# Patient Record
Sex: Male | Born: 1960
Health system: Southern US, Community
[De-identification: ages and names within clinical notes are randomized; demographics above are authoritative.]

## PROBLEM LIST (undated history)

## (undated) ENCOUNTER — Ambulatory Visit (HOSPITAL_BASED_OUTPATIENT_CLINIC_OR_DEPARTMENT_OTHER)

## (undated) DIAGNOSIS — R51 Headache: Secondary | ICD-10-CM

## (undated) DIAGNOSIS — M1990 Unspecified osteoarthritis, unspecified site: Secondary | ICD-10-CM

## (undated) DIAGNOSIS — R519 Headache, unspecified: Secondary | ICD-10-CM

## (undated) DIAGNOSIS — K219 Gastro-esophageal reflux disease without esophagitis: Secondary | ICD-10-CM

## (undated) DIAGNOSIS — S0990XA Unspecified injury of head, initial encounter: Secondary | ICD-10-CM

## (undated) DIAGNOSIS — L853 Xerosis cutis: Secondary | ICD-10-CM

## (undated) DIAGNOSIS — G5603 Carpal tunnel syndrome, bilateral upper limbs: Secondary | ICD-10-CM

## (undated) DIAGNOSIS — B1920 Unspecified viral hepatitis C without hepatic coma: Secondary | ICD-10-CM

## (undated) DIAGNOSIS — J439 Emphysema, unspecified: Secondary | ICD-10-CM

## (undated) DIAGNOSIS — F32A Depression, unspecified: Secondary | ICD-10-CM

## (undated) DIAGNOSIS — M549 Dorsalgia, unspecified: Secondary | ICD-10-CM

## (undated) DIAGNOSIS — I499 Cardiac arrhythmia, unspecified: Secondary | ICD-10-CM

## (undated) DIAGNOSIS — F419 Anxiety disorder, unspecified: Secondary | ICD-10-CM

## (undated) DIAGNOSIS — I700 Atherosclerosis of aorta: Secondary | ICD-10-CM

## (undated) DIAGNOSIS — G8929 Other chronic pain: Secondary | ICD-10-CM

## (undated) DIAGNOSIS — J449 Chronic obstructive pulmonary disease, unspecified: Secondary | ICD-10-CM

## (undated) DIAGNOSIS — K529 Noninfective gastroenteritis and colitis, unspecified: Secondary | ICD-10-CM

## (undated) DIAGNOSIS — F1020 Alcohol dependence, uncomplicated: Secondary | ICD-10-CM

## (undated) DIAGNOSIS — F329 Major depressive disorder, single episode, unspecified: Secondary | ICD-10-CM

## (undated) HISTORY — DX: Chronic obstructive pulmonary disease, unspecified: J44.9

## (undated) HISTORY — PX: HERNIA REPAIR: SHX51

## (undated) HISTORY — PX: UMBILICAL HERNIA REPAIR: SHX196

## (undated) HISTORY — DX: Atherosclerosis of aorta: I70.0

## (undated) HISTORY — PX: ESOPHAGOGASTRODUODENOSCOPY: SHX1529

## (undated) HISTORY — PX: WISDOM TOOTH EXTRACTION: SHX21

## (undated) HISTORY — PX: INGUINAL HERNIA REPAIR: SUR1180

## (undated) HISTORY — DX: Emphysema, unspecified: J43.9

## (undated) HISTORY — PX: BACK SURGERY: SHX140

---
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
## 2012-09-14 ENCOUNTER — Other Ambulatory Visit: Payer: Self-pay | Admitting: Neurosurgery

## 2012-09-14 DIAGNOSIS — M48061 Spinal stenosis, lumbar region without neurogenic claudication: Secondary | ICD-10-CM

## 2012-09-22 ENCOUNTER — Ambulatory Visit
Admission: RE | Admit: 2012-09-22 | Discharge: 2012-09-22 | Disposition: A | Discharge status: Home / Self Care | Payer: BC Managed Care – PPO | Source: Ambulatory Visit | Attending: Neurosurgery | Admitting: Neurosurgery

## 2012-09-22 VITALS — BP 106/67 | HR 75

## 2012-09-22 DIAGNOSIS — M48061 Spinal stenosis, lumbar region without neurogenic claudication: Secondary | ICD-10-CM

## 2012-09-22 MED ORDER — IOHEXOL 180 MG/ML  SOLN
15.0000 mL | Freq: Once | INTRAMUSCULAR | Status: AC | PRN
  Administered 2012-09-22: 15 mL via INTRATHECAL

## 2012-09-22 MED ORDER — DIAZEPAM 5 MG PO TABS
10.0000 mg | ORAL_TABLET | Freq: Once | ORAL | Status: AC
  Administered 2012-09-22: 10 mg via ORAL

## 2012-09-22 MED ORDER — ONDANSETRON HCL 4 MG/2ML IJ SOLN: 4.0000 mg | Freq: Once | INTRAMUSCULAR | Status: DC

## 2012-09-22 MED ORDER — MEPERIDINE HCL 100 MG/ML IJ SOLN: 100.0000 mg | Freq: Once | INTRAMUSCULAR | Status: DC

## 2012-09-22 NOTE — Progress Notes (Signed)
Pt states he has been off citalopram for the past 2 days.  Discharge instructions explained.

## 2012-10-31 LAB — HM COLONOSCOPY

## 2012-11-09 ENCOUNTER — Other Ambulatory Visit: Payer: Self-pay | Admitting: Neurosurgery

## 2012-11-14 ENCOUNTER — Encounter (HOSPITAL_COMMUNITY): Payer: Self-pay | Admitting: Pharmacy Technician

## 2012-11-21 NOTE — Pre-Procedure Instructions (Signed)
Clarence Davis  11/21/2012   Your procedure is scheduled on:  Tuesday, August 26th.  Report to [facility] at 5:30AM.  Call this number if you have problems the morning of surgery: [phone]   Remember:   Do not eat food or drink liquids after midnight.   Take these medicines the morning of surgery with A SIP OF WATER: Carvedilol (Coreg),  Omeprazole (Prilosec), Citalopram (Celexa).   Do not wear jewelry, make-up or nail polish.  Do not wear lotions, powders, or perfumes. You may wear deodorant.   Men may shave face and neck.  Do not bring valuables to the hospital.  [facility] is not responsible  for any belongings or valuables.  Contacts, dentures or bridgework may not be worn into surgery.  Leave suitcase in the car. After surgery it may be brought to your room.  For patients admitted to the hospital, checkout time is 11:00 AM the day of discharge.     Special Instructions: Shower using CHG 2 nights before surgery and the night before surgery.  If you shower the day of surgery use CHG.  Use special wash - you have one bottle of CHG for all showers.  You should use approximately 1/3 of the bottle for each shower.   Please read over the following fact sheets that you were given: Pain Booklet, Coughing and Deep Breathing, Blood Transfusion Information and Surgical Site Infection Prevention

## 2012-11-22 ENCOUNTER — Encounter (HOSPITAL_COMMUNITY)
Admission: RE | Admit: 2012-11-22 | Discharge: 2012-11-22 | Disposition: A | Discharge status: Home / Self Care | Payer: BC Managed Care – PPO | Source: Ambulatory Visit | Attending: Neurosurgery | Admitting: Neurosurgery

## 2012-12-16 ENCOUNTER — Encounter (HOSPITAL_COMMUNITY)
Admission: RE | Admit: 2012-12-16 | Discharge: 2012-12-16 | Disposition: A | Discharge status: Home / Self Care | Payer: BC Managed Care – PPO | Source: Ambulatory Visit | Attending: Neurosurgery | Admitting: Neurosurgery

## 2012-12-16 ENCOUNTER — Encounter (HOSPITAL_COMMUNITY): Payer: Self-pay

## 2012-12-16 DIAGNOSIS — Z01812 Encounter for preprocedural laboratory examination: Secondary | ICD-10-CM | POA: Insufficient documentation

## 2012-12-16 DIAGNOSIS — Z01818 Encounter for other preprocedural examination: Secondary | ICD-10-CM | POA: Insufficient documentation

## 2012-12-16 HISTORY — DX: Gastro-esophageal reflux disease without esophagitis: K21.9

## 2012-12-16 HISTORY — DX: Xerosis cutis: L85.3

## 2012-12-16 HISTORY — DX: Other chronic pain: G89.29

## 2012-12-16 HISTORY — DX: Alcohol dependence, uncomplicated: F10.20

## 2012-12-16 HISTORY — DX: Unspecified injury of head, initial encounter: S09.90XA

## 2012-12-16 HISTORY — DX: Unspecified osteoarthritis, unspecified site: M19.90

## 2012-12-16 HISTORY — DX: Depression, unspecified: F32.A

## 2012-12-16 HISTORY — DX: Cardiac arrhythmia, unspecified: I49.9

## 2012-12-16 HISTORY — DX: Major depressive disorder, single episode, unspecified: F32.9

## 2012-12-16 HISTORY — DX: Dorsalgia, unspecified: M54.9

## 2012-12-16 LAB — CBC
HCT: 43.3 % (ref 39.0–52.0)
Hemoglobin: 14.9 g/dL (ref 13.0–17.0)
MCH: 33.6 pg (ref 26.0–34.0)
MCV: 97.7 fL (ref 78.0–100.0)
Platelets: 134 10*3/uL — ABNORMAL LOW (ref 150–400)
RBC: 4.43 MIL/uL (ref 4.22–5.81)
WBC: 5.1 10*3/uL (ref 4.0–10.5)

## 2012-12-16 LAB — COMPREHENSIVE METABOLIC PANEL
ALT: 39 U/L (ref 0–53)
AST: 39 U/L — ABNORMAL HIGH (ref 0–37)
Alkaline Phosphatase: 66 U/L (ref 39–117)
Calcium: 9.4 mg/dL (ref 8.4–10.5)
Potassium: 3.7 mEq/L (ref 3.5–5.1)
Sodium: 137 mEq/L (ref 135–145)
Total Protein: 7.2 g/dL (ref 6.0–8.3)

## 2012-12-16 LAB — SURGICAL PCR SCREEN
MRSA, PCR: NEGATIVE
Staphylococcus aureus: NEGATIVE

## 2012-12-16 LAB — ABO/RH: ABO/RH(D): A POS

## 2012-12-16 LAB — TYPE AND SCREEN

## 2012-12-16 NOTE — Progress Notes (Signed)
12/16/12 1007  OBSTRUCTIVE SLEEP APNEA  Have you ever been diagnosed with sleep apnea through a sleep study? No  Do you snore loudly (loud enough to be heard through closed doors)?  1  Do you often feel tired, fatigued, or sleepy during the daytime? 0  Has anyone observed you stop breathing during your sleep? 0  Do you have, or are you being treated for high blood pressure? 1  BMI more than 35 kg/m2? 0  Age over 51 years old? 1  Neck circumference greater than 40 cm/18 inches? (15 1/2)  Gender: 1  Obstructive Sleep Apnea Score 4  Score 4 or greater  Results sent to PCP

## 2012-12-16 NOTE — Progress Notes (Addendum)
Saw a cardiologist about a yr ago-Dr.Ferns in [place]-requested office visit  Echo and stress test to be requested from Dr.Ferns  EKG and CXR to be requested from Dr.Ferns---[phone](phone) and [phone](fax)  Medical MD in [place] is Dr.Perry  Denies ever having a heart cath

## 2012-12-16 NOTE — Pre-Procedure Instructions (Addendum)
Arslan Kier  12/16/2012   Your procedure is scheduled on:  Fri, Sept 19 @ 11:30 AM  Report to [facility] at 8:30 AM.  Call this number if you have problems the morning of surgery: [phone]   Remember:   Do not eat food or drink liquids after midnight.   Take these medicines the morning of surgery with A SIP OF WATER: Carvedilol(Coreg),Celexa(Citalopram),and Prilosec(Omeprazole) and Pain Pill(if needed)               No Goody's,BC's,Aleve,Ibuprofen,Aspirin,Fish Oil,or any Herbal Medications   Do not wear jewelry.  Do not wear lotions, powders, or colognes. You may wear deodorant.  Men may shave face and neck.  Do not bring valuables to the hospital.  [facility] is not responsible                   for any belongings or valuables.  Contacts, dentures or bridgework may not be worn into surgery.  Leave suitcase in the car. After surgery it may be brought to your room.  For patients admitted to the hospital, checkout time is 11:00 AM the day of  discharge.   Special Instructions: Shower using CHG 2 nights before surgery and the night before surgery.  If you shower the day of surgery use CHG.  Use special wash - you have one bottle of CHG for all showers.  You should use approximately 1/3 of the bottle for each shower.   Please read over the following fact sheets that you were given: Pain Booklet, Coughing and Deep Breathing, Blood Transfusion Information, MRSA Information and Surgical Site Infection Prevention

## 2012-12-16 NOTE — Progress Notes (Signed)
12/16/12 1228  OBSTRUCTIVE SLEEP APNEA  Have you ever been diagnosed with sleep apnea through a sleep study? No  Do you snore loudly (loud enough to be heard through closed doors)?  1  Do you often feel tired, fatigued, or sleepy during the daytime? 0  Has anyone observed you stop breathing during your sleep? 0  Do you have, or are you being treated for high blood pressure? 1  BMI more than 35 kg/m2? 0  Age over 51 years old? 1  Neck circumference greater than 40 cm/18 inches? 0  Gender: 1  Obstructive Sleep Apnea Score 4  Score 4 or greater  Results sent to PCP

## 2012-12-22 MED ORDER — CEFAZOLIN SODIUM-DEXTROSE 2-3 GM-% IV SOLR
2.0000 g | INTRAVENOUS | Status: AC
  Administered 2012-12-23: 2 g via INTRAVENOUS

## 2012-12-23 ENCOUNTER — Encounter (HOSPITAL_COMMUNITY)
Admission: RE | Disposition: A | Discharge status: Home / Self Care | Payer: BC Managed Care – PPO | Source: Ambulatory Visit | Attending: Neurosurgery

## 2012-12-23 ENCOUNTER — Inpatient Hospital Stay (HOSPITAL_COMMUNITY): Payer: BC Managed Care – PPO

## 2012-12-23 ENCOUNTER — Inpatient Hospital Stay (HOSPITAL_COMMUNITY)
Admission: RE | Admit: 2012-12-23 | Discharge: 2012-12-25 | DRG: 755 | Disposition: A | Discharge status: Home / Self Care | Payer: BC Managed Care – PPO | Source: Ambulatory Visit | Attending: Neurosurgery | Admitting: Neurosurgery

## 2012-12-23 ENCOUNTER — Encounter (HOSPITAL_COMMUNITY): Payer: Self-pay | Admitting: Certified Registered Nurse Anesthetist

## 2012-12-23 ENCOUNTER — Inpatient Hospital Stay (HOSPITAL_COMMUNITY): Payer: BC Managed Care – PPO | Admitting: Certified Registered Nurse Anesthetist

## 2012-12-23 DIAGNOSIS — F3289 Other specified depressive episodes: Secondary | ICD-10-CM | POA: Diagnosis present

## 2012-12-23 DIAGNOSIS — Q762 Congenital spondylolisthesis: Secondary | ICD-10-CM

## 2012-12-23 DIAGNOSIS — I4949 Other premature depolarization: Secondary | ICD-10-CM | POA: Diagnosis present

## 2012-12-23 DIAGNOSIS — K219 Gastro-esophageal reflux disease without esophagitis: Secondary | ICD-10-CM | POA: Diagnosis present

## 2012-12-23 DIAGNOSIS — Z981 Arthrodesis status: Secondary | ICD-10-CM

## 2012-12-23 DIAGNOSIS — B1920 Unspecified viral hepatitis C without hepatic coma: Secondary | ICD-10-CM | POA: Diagnosis present

## 2012-12-23 DIAGNOSIS — G8929 Other chronic pain: Secondary | ICD-10-CM | POA: Diagnosis present

## 2012-12-23 DIAGNOSIS — F17200 Nicotine dependence, unspecified, uncomplicated: Secondary | ICD-10-CM | POA: Diagnosis present

## 2012-12-23 DIAGNOSIS — F329 Major depressive disorder, single episode, unspecified: Secondary | ICD-10-CM | POA: Diagnosis present

## 2012-12-23 DIAGNOSIS — M5126 Other intervertebral disc displacement, lumbar region: Principal | ICD-10-CM | POA: Diagnosis present

## 2012-12-23 SURGERY — POSTERIOR LUMBAR FUSION 1 LEVEL
Anesthesia: General | Site: Back | Wound class: Clean

## 2012-12-23 MED ORDER — DIPHENHYDRAMINE HCL 12.5 MG/5ML PO ELIX
12.5000 mg | ORAL_SOLUTION | Freq: Four times a day (QID) | ORAL | Status: DC | PRN
  Administered 2012-12-24: 12.5 mg via ORAL
  Filled 2012-12-23: qty 10

## 2012-12-23 MED ORDER — NICOTINE 21 MG/24HR TD PT24
21.0000 mg | MEDICATED_PATCH | Freq: Every day | TRANSDERMAL | Status: DC
  Administered 2012-12-23 – 2012-12-25 (×3): 21 mg via TRANSDERMAL
  Filled 2012-12-23 (×3): qty 1

## 2012-12-23 MED ORDER — SODIUM CHLORIDE 0.9 % IJ SOLN
3.0000 mL | Freq: Two times a day (BID) | INTRAMUSCULAR | Status: DC
  Administered 2012-12-25: 3 mL via INTRAVENOUS

## 2012-12-23 MED ORDER — DIAZEPAM 5 MG PO TABS
ORAL_TABLET | ORAL | Status: AC
  Administered 2012-12-23: 5 mg via ORAL
  Filled 2012-12-23: qty 1

## 2012-12-23 MED ORDER — ONDANSETRON HCL 4 MG/2ML IJ SOLN
4.0000 mg | Freq: Four times a day (QID) | INTRAMUSCULAR | Status: DC | PRN
  Administered 2012-12-24: 4 mg via INTRAVENOUS

## 2012-12-23 MED ORDER — CITALOPRAM HYDROBROMIDE 40 MG PO TABS
40.0000 mg | ORAL_TABLET | Freq: Every day | ORAL | Status: DC
  Administered 2012-12-24: 40 mg via ORAL
  Filled 2012-12-23 (×2): qty 1

## 2012-12-23 MED ORDER — DIAZEPAM 5 MG PO TABS
5.0000 mg | ORAL_TABLET | Freq: Four times a day (QID) | ORAL | Status: DC | PRN
  Administered 2012-12-23 – 2012-12-25 (×3): 5 mg via ORAL
  Filled 2012-12-23 (×2): qty 1

## 2012-12-23 MED ORDER — ROCURONIUM BROMIDE 100 MG/10ML IV SOLN
INTRAVENOUS | Status: DC | PRN
  Administered 2012-12-23: 50 mg via INTRAVENOUS

## 2012-12-23 MED ORDER — ACETAMINOPHEN 325 MG PO TABS
650.0000 mg | ORAL_TABLET | ORAL | Status: DC | PRN
  Administered 2012-12-25: 650 mg via ORAL
  Filled 2012-12-23: qty 2

## 2012-12-23 MED ORDER — MORPHINE SULFATE (PF) 1 MG/ML IV SOLN
INTRAVENOUS | Status: DC
  Administered 2012-12-23: 16:00:00 via INTRAVENOUS
  Administered 2012-12-23: 7.47 mg via INTRAVENOUS
  Administered 2012-12-23: 16.5 mg via INTRAVENOUS
  Administered 2012-12-24: 10.5 mg via INTRAVENOUS
  Administered 2012-12-24: 22:00:00 via INTRAVENOUS
  Administered 2012-12-24: 23.67 mg via INTRAVENOUS
  Administered 2012-12-24: 18:00:00 via INTRAVENOUS
  Administered 2012-12-24: 24 mg via INTRAVENOUS
  Administered 2012-12-24: 3 mg via INTRAVENOUS
  Administered 2012-12-24: 6 mg via INTRAVENOUS
  Administered 2012-12-25 (×2): via INTRAVENOUS
  Filled 2012-12-23 (×7): qty 25

## 2012-12-23 MED ORDER — VECURONIUM BROMIDE 10 MG IV SOLR
INTRAVENOUS | Status: DC | PRN
  Administered 2012-12-23: 2 mg via INTRAVENOUS
  Administered 2012-12-23: 1 mg via INTRAVENOUS

## 2012-12-23 MED ORDER — CARVEDILOL 3.125 MG PO TABS
3.1250 mg | ORAL_TABLET | Freq: Every day | ORAL | Status: DC
  Filled 2012-12-23 (×3): qty 1

## 2012-12-23 MED ORDER — GLYCOPYRROLATE 0.2 MG/ML IJ SOLN
INTRAMUSCULAR | Status: DC | PRN
  Administered 2012-12-23: .8 mg via INTRAVENOUS

## 2012-12-23 MED ORDER — DIPHENHYDRAMINE HCL 50 MG/ML IJ SOLN
12.5000 mg | Freq: Four times a day (QID) | INTRAMUSCULAR | Status: DC | PRN
  Administered 2012-12-23 – 2012-12-24 (×2): 12.5 mg via INTRAVENOUS
  Filled 2012-12-23 (×2): qty 1

## 2012-12-23 MED ORDER — NALOXONE HCL 0.4 MG/ML IJ SOLN: 0.4000 mg | INTRAMUSCULAR | Status: DC | PRN

## 2012-12-23 MED ORDER — PROPOFOL 10 MG/ML IV BOLUS
INTRAVENOUS | Status: DC | PRN
  Administered 2012-12-23: 200 mg via INTRAVENOUS

## 2012-12-23 MED ORDER — OXYCODONE HCL 5 MG/5ML PO SOLN: 5.0000 mg | Freq: Once | ORAL | Status: AC | PRN

## 2012-12-23 MED ORDER — ZOLPIDEM TARTRATE 5 MG PO TABS: 5.0000 mg | ORAL_TABLET | Freq: Every evening | ORAL | Status: DC | PRN

## 2012-12-23 MED ORDER — FENTANYL CITRATE 0.05 MG/ML IJ SOLN
INTRAMUSCULAR | Status: AC
  Administered 2012-12-23: 100 ug via INTRAVENOUS
  Filled 2012-12-23: qty 2

## 2012-12-23 MED ORDER — MIDAZOLAM HCL 5 MG/5ML IJ SOLN
INTRAMUSCULAR | Status: DC | PRN
  Administered 2012-12-23: 2 mg via INTRAVENOUS

## 2012-12-23 MED ORDER — SODIUM CHLORIDE 0.9 % IJ SOLN: 9.0000 mL | INTRAMUSCULAR | Status: DC | PRN

## 2012-12-23 MED ORDER — SODIUM CHLORIDE 0.9 % IV SOLN: 250.0000 mL | INTRAVENOUS | Status: DC

## 2012-12-23 MED ORDER — FENTANYL CITRATE 0.05 MG/ML IJ SOLN
100.0000 ug | Freq: Once | INTRAMUSCULAR | Status: AC
  Administered 2012-12-23: 100 ug via INTRAVENOUS

## 2012-12-23 MED ORDER — NEOSTIGMINE METHYLSULFATE 1 MG/ML IJ SOLN
INTRAMUSCULAR | Status: DC | PRN
  Administered 2012-12-23: 5 mg via INTRAVENOUS

## 2012-12-23 MED ORDER — ASPIRIN EC 81 MG PO TBEC
81.0000 mg | DELAYED_RELEASE_TABLET | Freq: Every day | ORAL | Status: DC
  Administered 2012-12-23 – 2012-12-25 (×3): 81 mg via ORAL
  Filled 2012-12-23 (×3): qty 1

## 2012-12-23 MED ORDER — OXYCODONE HCL 5 MG PO TABS
ORAL_TABLET | ORAL | Status: AC
  Administered 2012-12-23: 5 mg via ORAL
  Filled 2012-12-23: qty 1

## 2012-12-23 MED ORDER — SODIUM CHLORIDE 0.9 % IJ SOLN: 3.0000 mL | INTRAMUSCULAR | Status: DC | PRN

## 2012-12-23 MED ORDER — PROMETHAZINE HCL 25 MG/ML IJ SOLN: 6.2500 mg | INTRAMUSCULAR | Status: DC | PRN

## 2012-12-23 MED ORDER — PHENOL 1.4 % MT LIQD: 1.0000 | OROMUCOSAL | Status: DC | PRN

## 2012-12-23 MED ORDER — THROMBIN 20000 UNITS EX SOLR
CUTANEOUS | Status: DC | PRN
  Administered 2012-12-23: 14:00:00 via TOPICAL

## 2012-12-23 MED ORDER — OXYCODONE-ACETAMINOPHEN 5-325 MG PO TABS
1.0000 | ORAL_TABLET | ORAL | Status: DC | PRN
  Administered 2012-12-23 – 2012-12-25 (×6): 2 via ORAL
  Filled 2012-12-23 (×7): qty 2

## 2012-12-23 MED ORDER — OXYCODONE HCL 5 MG PO TABS
5.0000 mg | ORAL_TABLET | Freq: Once | ORAL | Status: AC | PRN
  Administered 2012-12-23: 5 mg via ORAL

## 2012-12-23 MED ORDER — ACETAMINOPHEN 650 MG RE SUPP: 650.0000 mg | RECTAL | Status: DC | PRN

## 2012-12-23 MED ORDER — HYDROMORPHONE HCL PF 1 MG/ML IJ SOLN
0.2500 mg | INTRAMUSCULAR | Status: DC | PRN
  Administered 2012-12-23 (×2): 0.5 mg via INTRAVENOUS

## 2012-12-23 MED ORDER — EPHEDRINE SULFATE 50 MG/ML IJ SOLN
INTRAMUSCULAR | Status: DC | PRN
  Administered 2012-12-23: 10 mg via INTRAVENOUS

## 2012-12-23 MED ORDER — BUPIVACAINE-EPINEPHRINE PF 0.5-1:200000 % IJ SOLN
INTRAMUSCULAR | Status: DC | PRN
  Administered 2012-12-23: 10 mL

## 2012-12-23 MED ORDER — FENTANYL CITRATE 0.05 MG/ML IJ SOLN
INTRAMUSCULAR | Status: DC | PRN
  Administered 2012-12-23 (×2): 50 ug via INTRAVENOUS
  Administered 2012-12-23 (×2): 100 ug via INTRAVENOUS
  Administered 2012-12-23 (×2): 50 ug via INTRAVENOUS
  Administered 2012-12-23: 100 ug via INTRAVENOUS

## 2012-12-23 MED ORDER — PANTOPRAZOLE SODIUM 40 MG PO TBEC
40.0000 mg | DELAYED_RELEASE_TABLET | Freq: Every day | ORAL | Status: DC
  Administered 2012-12-23 – 2012-12-25 (×3): 40 mg via ORAL
  Filled 2012-12-23 (×3): qty 1

## 2012-12-23 MED ORDER — PHENYLEPHRINE HCL 10 MG/ML IJ SOLN
INTRAMUSCULAR | Status: DC | PRN
  Administered 2012-12-23: 80 ug via INTRAVENOUS

## 2012-12-23 MED ORDER — CEFAZOLIN SODIUM 1-5 GM-% IV SOLN
1.0000 g | Freq: Three times a day (TID) | INTRAVENOUS | Status: AC
  Administered 2012-12-23 – 2012-12-24 (×2): 1 g via INTRAVENOUS
  Filled 2012-12-23 (×2): qty 50

## 2012-12-23 MED ORDER — CEFAZOLIN SODIUM-DEXTROSE 2-3 GM-% IV SOLR
INTRAVENOUS | Status: AC
  Filled 2012-12-23: qty 50

## 2012-12-23 MED ORDER — MORPHINE SULFATE (PF) 1 MG/ML IV SOLN
INTRAVENOUS | Status: AC
  Filled 2012-12-23: qty 25

## 2012-12-23 MED ORDER — ONDANSETRON HCL 4 MG/2ML IJ SOLN
INTRAMUSCULAR | Status: DC | PRN
  Administered 2012-12-23: 4 mg via INTRAVENOUS

## 2012-12-23 MED ORDER — LACTATED RINGERS IV SOLN
INTRAVENOUS | Status: DC
  Administered 2012-12-23 (×2): via INTRAVENOUS

## 2012-12-23 MED ORDER — HYDROMORPHONE HCL PF 1 MG/ML IJ SOLN
INTRAMUSCULAR | Status: DC | PRN
  Administered 2012-12-23: 1 mg via INTRAVENOUS

## 2012-12-23 MED ORDER — SODIUM CHLORIDE 0.9 % IV SOLN
INTRAVENOUS | Status: DC
  Administered 2012-12-23 – 2012-12-24 (×2): via INTRAVENOUS

## 2012-12-23 MED ORDER — MENTHOL 3 MG MT LOZG: 1.0000 | LOZENGE | OROMUCOSAL | Status: DC | PRN

## 2012-12-23 MED ORDER — ARTIFICIAL TEARS OP OINT
TOPICAL_OINTMENT | OPHTHALMIC | Status: DC | PRN
  Administered 2012-12-23: 1 via OPHTHALMIC

## 2012-12-23 MED ORDER — LIDOCAINE HCL (CARDIAC) 20 MG/ML IV SOLN
INTRAVENOUS | Status: DC | PRN
  Administered 2012-12-23: 80 mg via INTRAVENOUS

## 2012-12-23 MED ORDER — HYDROMORPHONE HCL PF 1 MG/ML IJ SOLN
INTRAMUSCULAR | Status: AC
  Administered 2012-12-23: 0.5 mg via INTRAVENOUS
  Filled 2012-12-23: qty 1

## 2012-12-23 MED ORDER — ONDANSETRON HCL 4 MG/2ML IJ SOLN
4.0000 mg | INTRAMUSCULAR | Status: DC | PRN
  Filled 2012-12-23: qty 2

## 2012-12-23 MED ORDER — 0.9 % SODIUM CHLORIDE (POUR BTL) OPTIME
TOPICAL | Status: DC | PRN
  Administered 2012-12-23: 1000 mL

## 2012-12-23 SURGICAL SUPPLY — 70 items
BENZOIN TINCTURE PRP APPL 2/3 (GAUZE/BANDAGES/DRESSINGS) ×2 IMPLANT
BLADE SURG ROTATE 9660 (MISCELLANEOUS) IMPLANT
BUR ACORN 6.0 (BURR) ×2 IMPLANT
BUR MATCHSTICK NEURO 3.0 LAGG (BURR) ×2 IMPLANT
CANISTER SUCTION 2500CC (MISCELLANEOUS) ×2 IMPLANT
CAP REVERE LOCKING (Cap) ×8 IMPLANT
CLOTH BEACON ORANGE TIMEOUT ST (SAFETY) ×2 IMPLANT
CONN CROSSLINK REV 6.35 48-60 (Connector) ×2 IMPLANT
CONNECTOR CRSLNK REV6.35 48-60 (Connector) ×1 IMPLANT
CONT SPEC 4OZ CLIKSEAL STRL BL (MISCELLANEOUS) ×4 IMPLANT
COVER BACK TABLE 24X17X13 BIG (DRAPES) IMPLANT
COVER TABLE BACK 60X90 (DRAPES) ×2 IMPLANT
DRAPE C-ARM 42X72 X-RAY (DRAPES) ×4 IMPLANT
DRAPE LAPAROTOMY 100X72X124 (DRAPES) ×2 IMPLANT
DRAPE POUCH INSTRU U-SHP 10X18 (DRAPES) ×2 IMPLANT
DRSG PAD ABDOMINAL 8X10 ST (GAUZE/BANDAGES/DRESSINGS) IMPLANT
DURAPREP 26ML APPLICATOR (WOUND CARE) ×2 IMPLANT
ELECT BLADE 4.0 EZ CLEAN MEGAD (MISCELLANEOUS) ×2
ELECT REM PT RETURN 9FT ADLT (ELECTROSURGICAL) ×2
ELECTRODE BLDE 4.0 EZ CLN MEGD (MISCELLANEOUS) ×1 IMPLANT
ELECTRODE REM PT RTRN 9FT ADLT (ELECTROSURGICAL) ×1 IMPLANT
EVACUATOR 1/8 PVC DRAIN (DRAIN) IMPLANT
EVACUATOR 3/16  PVC DRAIN (DRAIN) ×1
EVACUATOR 3/16 PVC DRAIN (DRAIN) ×1 IMPLANT
GAUZE SPONGE 4X4 16PLY XRAY LF (GAUZE/BANDAGES/DRESSINGS) ×4 IMPLANT
GLOVE BIO SURGEON STRL SZ8 (GLOVE) ×2 IMPLANT
GLOVE BIOGEL M 8.0 STRL (GLOVE) ×4 IMPLANT
GLOVE BIOGEL PI IND STRL 8 (GLOVE) ×1 IMPLANT
GLOVE BIOGEL PI INDICATOR 8 (GLOVE) ×1
GLOVE ECLIPSE 7.5 STRL STRAW (GLOVE) ×6 IMPLANT
GLOVE EXAM NITRILE LRG STRL (GLOVE) IMPLANT
GLOVE EXAM NITRILE MD LF STRL (GLOVE) IMPLANT
GLOVE EXAM NITRILE XL STR (GLOVE) IMPLANT
GLOVE EXAM NITRILE XS STR PU (GLOVE) IMPLANT
GOWN BRE IMP SLV AUR LG STRL (GOWN DISPOSABLE) ×2 IMPLANT
GOWN BRE IMP SLV AUR XL STRL (GOWN DISPOSABLE) ×4 IMPLANT
GOWN STRL REIN 2XL LVL4 (GOWN DISPOSABLE) ×2 IMPLANT
KIT BASIN OR (CUSTOM PROCEDURE TRAY) ×2 IMPLANT
KIT INFUSE MEDIUM (Orthopedic Implant) ×2 IMPLANT
KIT ROOM TURNOVER OR (KITS) ×2 IMPLANT
MILL MEDIUM DISP (BLADE) ×2 IMPLANT
NEEDLE HYPO 18GX1.5 BLUNT FILL (NEEDLE) IMPLANT
NEEDLE HYPO 21X1.5 SAFETY (NEEDLE) IMPLANT
NEEDLE HYPO 25X1 1.5 SAFETY (NEEDLE) IMPLANT
NS IRRIG 1000ML POUR BTL (IV SOLUTION) ×2 IMPLANT
PACK FOAM VITOSS 10CC (Orthopedic Implant) ×2 IMPLANT
PACK LAMINECTOMY NEURO (CUSTOM PROCEDURE TRAY) ×2 IMPLANT
PAD ARMBOARD 7.5X6 YLW CONV (MISCELLANEOUS) ×6 IMPLANT
PATTIES SURGICAL .5 X1 (DISPOSABLE) ×2 IMPLANT
PATTIES SURGICAL .5 X3 (DISPOSABLE) IMPLANT
ROD REVERE 6.35 45MM (Rod) ×4 IMPLANT
SCREW REVERE 5.5X45 (Screw) ×8 IMPLANT
SPACER SUSTAIN O 10X26 15MM (Spacer) ×4 IMPLANT
SPONGE GAUZE 4X4 12PLY (GAUZE/BANDAGES/DRESSINGS) ×2 IMPLANT
SPONGE LAP 4X18 X RAY DECT (DISPOSABLE) IMPLANT
SPONGE NEURO XRAY DETECT 1X3 (DISPOSABLE) IMPLANT
SPONGE SURGIFOAM ABS GEL 100 (HEMOSTASIS) ×2 IMPLANT
STRIP CLOSURE SKIN 1/2X4 (GAUZE/BANDAGES/DRESSINGS) ×2 IMPLANT
SUT VIC AB 1 CT1 18XBRD ANBCTR (SUTURE) ×2 IMPLANT
SUT VIC AB 1 CT1 8-18 (SUTURE) ×2
SUT VIC AB 2-0 CP2 18 (SUTURE) ×2 IMPLANT
SUT VIC AB 3-0 SH 8-18 (SUTURE) ×2 IMPLANT
SYR 20CC LL (SYRINGE) IMPLANT
SYR 20ML ECCENTRIC (SYRINGE) ×2 IMPLANT
SYR 5ML LL (SYRINGE) IMPLANT
TAPE CLOTH SURG 4X10 WHT LF (GAUZE/BANDAGES/DRESSINGS) ×2 IMPLANT
TOWEL OR 17X24 6PK STRL BLUE (TOWEL DISPOSABLE) ×2 IMPLANT
TOWEL OR 17X26 10 PK STRL BLUE (TOWEL DISPOSABLE) ×2 IMPLANT
TRAY FOLEY CATH 14FRSI W/METER (CATHETERS) ×2 IMPLANT
WATER STERILE IRR 1000ML POUR (IV SOLUTION) ×2 IMPLANT

## 2012-12-23 NOTE — Progress Notes (Signed)
Patient requesting something for pain Neuro OR is ready to send for patient will let them address pain

## 2012-12-23 NOTE — Preoperative (Signed)
Beta Blockers   Reason not to administer Beta Blockers:Not Applicable 

## 2012-12-23 NOTE — Progress Notes (Signed)
Spoke to Dr. Massage regarding pain medication order given. Patient placed on continuous pulse ox.

## 2012-12-23 NOTE — Anesthesia Preprocedure Evaluation (Addendum)
Anesthesia Evaluation  Patient identified by MRN, date of birth, ID band Patient awake    Reviewed: Allergy & Precautions, H&P , NPO status , Patient's Chart, lab work & pertinent test results  History of Anesthesia Complications Negative for: history of anesthetic complications  Airway Mallampati: II  Neck ROM: Full    Dental  (+) Teeth Intact   Pulmonary neg pulmonary ROS,  breath sounds clear to auscultation        Cardiovascular + dysrhythmias Rhythm:Regular Rate:Normal     Neuro/Psych negative neurological ROS     GI/Hepatic GERD-  ,(+) Hepatitis -  Endo/Other  negative endocrine ROS  Renal/GU negative Renal ROS     Musculoskeletal   Abdominal   Peds  Hematology negative hematology ROS (+)   Anesthesia Other Findings   Reproductive/Obstetrics                          Anesthesia Physical Anesthesia Plan  ASA: II  Anesthesia Plan: General   Post-op Pain Management:    Induction: Intravenous  Airway Management Planned: Oral ETT  Additional Equipment:   Intra-op Plan:   Post-operative Plan: Extubation in OR  Informed Consent: I have reviewed the patients History and Physical, chart, labs and discussed the procedure including the risks, benefits and alternatives for the proposed anesthesia with the patient or authorized representative who has indicated his/her understanding and acceptance.   Dental advisory given  Plan Discussed with: CRNA and Surgeon  Anesthesia Plan Comments:         Anesthesia Quick Evaluation

## 2012-12-23 NOTE — Transfer of Care (Signed)
Immediate Anesthesia Transfer of Care Note  Patient: Clarence Davis  Procedure(s) Performed: Procedure(s) with comments: Lumbar Four-Five Posterior lumbar interbody fusion (N/A) - POSTERIOR LUMBAR FUSION 1 LEVEL  Patient Location: PACU  Anesthesia Type:General  Level of Consciousness: awake, alert  and oriented  Airway & Oxygen Therapy: Patient Spontanous Breathing and Patient connected to nasal cannula oxygen  Post-op Assessment: Report given to PACU RN, Post -op Vital signs reviewed and stable and Patient moving all extremities  Post vital signs: Reviewed and stable  Complications: No apparent anesthesia complications

## 2012-12-23 NOTE — Anesthesia Procedure Notes (Signed)
Procedure Name: Intubation Date/Time: 12/23/2012 12:37 PM Performed by: Jerilee Hoh Pre-anesthesia Checklist: Patient identified, Emergency Drugs available, Suction available and Patient being monitored Patient Re-evaluated:Patient Re-evaluated prior to inductionOxygen Delivery Method: Circle system utilized Preoxygenation: Pre-oxygenation with 100% oxygen Intubation Type: IV induction Ventilation: Mask ventilation without difficulty Laryngoscope Size: Mac and 4 Grade View: Grade I Tube type: Oral Tube size: 7.5 mm Number of attempts: 1 Airway Equipment and Method: Stylet Placement Confirmation: ETT inserted through vocal cords under direct vision,  positive ETCO2 and breath sounds checked- equal and bilateral Secured at: 22 cm Tube secured with: Tape Dental Injury: Teeth and Oropharynx as per pre-operative assessment

## 2012-12-23 NOTE — Progress Notes (Signed)
Op note [phone]

## 2012-12-23 NOTE — Progress Notes (Signed)
Patient ID: Clarence Davis, male   DOB: 06-07-1960, 52 y.o.   MRN: [id number] C/o incisional pain, no weakness.

## 2012-12-23 NOTE — H&P (Signed)
Clarence Davis is an 52 y.o. male.   Chief Complaint: lbp HPI: patient complaining  of lbp with radiation to both lower extremities which started almost 1 year ago with tingling sensation. walkig is getting difficuly up to the point he needs to sit to get rid off the pain. Had an outpatient myelogram.  Past Medical History  Diagnosis Date  . GERD (gastroesophageal reflux disease)     takes Omeprazole daily  . Depression     takes Citalopram daily  . Dysrhythmia     pVC's takes Carvedilol daily  . History of bronchitis     many yrs ago  . History of migraine     last one on 12/15/12  . Head injury     early 9's  . Arthritis   . Chronic back pain   . Dry skin   . Hepatitis     C  . Alcoholic     quit drinking many yrs ago per pt    Past Surgical History  Procedure Laterality Date  . Left arm surgery as a child    . Left clavicle surgery    . Hernia repair      2 umbilical;left inguinal   . Colonscopy    . Esophagogastroduodenoscopy      No family history on file. Social History:  reports that he has been smoking Cigarettes.  He has a 12.5 pack-year smoking history. He has never used smokeless tobacco. He reports that he does not drink alcohol or use illicit drugs.  Allergies: No Known Allergies  No prescriptions prior to admission    No results found for this or any previous visit (from the past 48 hour(s)). No results found.  Review of Systems  Constitutional: Negative.   HENT: Positive for tinnitus.   Eyes: Negative.   Respiratory: Positive for shortness of breath.   Cardiovascular: Positive for palpitations.  Gastrointestinal: Positive for heartburn.  Genitourinary: Positive for urgency.  Musculoskeletal: Positive for back pain.  Neurological: Positive for sensory change and focal weakness.  Endo/Heme/Allergies: Negative.   Psychiatric/Behavioral: Positive for depression. The patient is nervous/anxious.     There were no vitals taken for this  visit. Physical Exam  Hent, nl. Neck,cv, nl. Lungs bilateral wheezing. Abdomen, soft. Extremities, nl. NEURO  Weakness of both DF feet. dtr 1 plus.SLR 60 DEGREES .  Myelo shows spondylolisthesis at l4-5  And borderline stenosis at l34 and l5s1  Assessment/Plan Decompression and fusion at l45. Patient aware of risks and benefits  Clarence Davis M 12/23/2012, 7:57 AM

## 2012-12-24 LAB — CBC WITH DIFFERENTIAL/PLATELET
Basophils Absolute: 0 K/uL (ref 0.0–0.1)
Basophils Relative: 0 % (ref 0–1)
Eosinophils Absolute: 0 K/uL (ref 0.0–0.7)
Eosinophils Relative: 0 % (ref 0–5)
HCT: 38.5 % — ABNORMAL LOW (ref 39.0–52.0)
Hemoglobin: 13.4 g/dL (ref 13.0–17.0)
Lymphocytes Relative: 6 % — ABNORMAL LOW (ref 12–46)
Lymphs Abs: 0.5 K/uL — ABNORMAL LOW (ref 0.7–4.0)
MCH: 34 pg (ref 26.0–34.0)
MCHC: 34.8 g/dL (ref 30.0–36.0)
MCV: 97.7 fL (ref 78.0–100.0)
Monocytes Absolute: 0.7 K/uL (ref 0.1–1.0)
Monocytes Relative: 9 % (ref 3–12)
Neutro Abs: 7.1 K/uL (ref 1.7–7.7)
Neutrophils Relative %: 85 % — ABNORMAL HIGH (ref 43–77)
Platelets: 126 K/uL — ABNORMAL LOW (ref 150–400)
RBC: 3.94 MIL/uL — ABNORMAL LOW (ref 4.22–5.81)
RDW: 14.2 % (ref 11.5–15.5)
WBC: 8.4 K/uL (ref 4.0–10.5)

## 2012-12-24 LAB — BASIC METABOLIC PANEL WITH GFR
BUN: 8 mg/dL (ref 6–23)
CO2: 28 meq/L (ref 19–32)
Calcium: 8.7 mg/dL (ref 8.4–10.5)
Chloride: 97 meq/L (ref 96–112)
Creatinine, Ser: 0.67 mg/dL (ref 0.50–1.35)
GFR calc Af Amer: >90 mL/min
GFR calc non Af Amer: >90 mL/min
Glucose, Bld: 135 mg/dL — ABNORMAL HIGH (ref 70–99)
Potassium: 4 meq/L (ref 3.5–5.1)
Sodium: 132 meq/L — ABNORMAL LOW (ref 135–145)

## 2012-12-24 NOTE — Progress Notes (Signed)
Physical Therapy Evaluation Patient Details Name: Clarence Davis MRN: [id number] DOB: Aug 16, 1960 Today's Date: 12/24/2012 Time: 4098-1191 PT Time Calculation (min): 23 min  PT Assessment / Plan / Recommendation History of Present Illness  Pt is a L4-5 decompression.  Clinical Impression  Pt admitted with surgery as above. Pt currently with functional limitations due to the deficits listed below (see PT Problem List). Pt will benefit from PT to address balance and endurance as well as safety.  Has 24 hour care per pt.  Today treatment limited by lethargy and incr pain.   Pt will benefit from skilled PT to increase their independence and safety with mobility to allow discharge to the venue listed below.     PT Assessment  Patient needs continued PT services    Follow Up Recommendations  Home health PT;Supervision/Assistance - 24 hour                Equipment Recommendations  Rolling walker with 5" wheels;3in1 (PT)         Frequency Min 6X/week    Precautions / Restrictions Precautions Precautions: Back;Fall Precaution Booklet Issued: Yes (comment) Precaution Comments: Brace not ordered until this am by Dr. Newell Coral however Dr. Newell Coral ok'd PT/OT to get pt OOB to chair. Required Braces or Orthoses: Spinal Brace Spinal Brace: Applied in sitting position Restrictions Weight Bearing Restrictions: No   Pertinent Vitals/Pain VSS, 10/10 pain back and head per pt- nursing aware.      Mobility  Bed Mobility Bed Mobility: Rolling Left;Left Sidelying to Sit;Sitting - Scoot to Edge of Bed Rolling Left: With rail;4: Min guard Left Sidelying to Sit: With rails;HOB elevated;4: Min guard Sitting - Scoot to Edge of Bed: 4: Min guard Details for Bed Mobility Assistance: cues for log roll and technique.   Transfers Transfers: Sit to Stand;Stand to Sit Sit to Stand: 1: +2 Total assist;With upper extremity assist;From bed Sit to Stand: Patient Percentage: 80% Stand to Sit: 1: +2 Total  assist;With upper extremity assist;To chair/3-in-1;With armrests Stand to Sit: Patient Percentage: 80% Details for Transfer Assistance: Cues for hand placement as well as cues for sequencing movement.   Ambulation/Gait Ambulation/Gait Assistance: 1: +2 Total assist Ambulation/Gait: Patient Percentage: 80% Ambulation Distance (Feet): 15 Feet Assistive device: 2 person hand held assist Ambulation/Gait Assistance Details: Pt slightly unsteady with gait.  Pt confused with poor safety questionably secondary to medications.  Needed cues for sit to stand for safety.   Gait Pattern: Step-through pattern;Decreased stride length Gait velocity: decreased Stairs: No Wheelchair Mobility Wheelchair Mobility: No         PT Diagnosis: Generalized weakness;Acute pain  PT Problem List: Decreased activity tolerance;Decreased balance;Decreased mobility;Decreased knowledge of use of DME;Decreased cognition;Decreased safety awareness;Decreased knowledge of precautions;Pain PT Treatment Interventions: DME instruction;Gait training;Functional mobility training;Therapeutic activities;Therapeutic exercise;Stair training;Balance training;Cognitive remediation;Patient/family education     PT Goals(Current goals can be found in the care plan section) Acute Rehab PT Goals Patient Stated Goal: to go home PT Goal Formulation: With patient Time For Goal Achievement: 12/31/12 Potential to Achieve Goals: Good  Visit Information  Last PT Received On: 12/24/12 Assistance Needed: +2 (due to lethargy) PT/OT Co-Evaluation/Treatment: Yes History of Present Illness: Pt is a L4-5 decompression.       Prior Functioning  Home Living Family/patient expects to be discharged to:: Private residence Living Arrangements: Spouse/significant other Available Help at Discharge: Family;Available 24 hours/day (fiance, mom and friends) Type of Home: House Home Access: Stairs to enter [employer] of Steps: 6 Entrance  Stairs-Rails:  (  states he can hold wall and door frame) Home Layout: One level Home Equipment:  (recliner, walk in shower with built in seat) Prior Function Level of Independence: Independent Communication Communication: No difficulties    Cognition  Cognition Arousal/Alertness: Lethargic;Suspect due to medications Behavior During Therapy: Anxious Overall Cognitive Status: Impaired/Different from baseline Area of Impairment: Orientation;Attention;Memory;Following commands;Safety/judgement;Awareness;Problem solving Orientation Level: Disoriented to;Time Current Attention Level: Focused Memory: Decreased short-term memory Following Commands: Follows one step commands consistently;Follows one step commands with increased time Safety/Judgement: Decreased awareness of safety;Decreased awareness of deficits Awareness: Intellectual Problem Solving: Slow processing;Difficulty sequencing;Requires verbal cues    Extremity/Trunk Assessment Upper Extremity Assessment Upper Extremity Assessment: Defer to OT evaluation Lower Extremity Assessment Lower Extremity Assessment: Generalized weakness   Balance Balance Balance Assessed: Yes Static Standing Balance Static Standing - Balance Support: Bilateral upper extremity supported;During functional activity Static Standing - Level of Assistance: 4: Min assist Static Standing - Comment/# of Minutes: 2  End of Session PT - End of Session Equipment Utilized During Treatment: Gait belt Activity Tolerance: Patient limited by fatigue;Patient limited by lethargy;Patient limited by pain Patient left: in chair;with call bell/phone within reach;with chair alarm set Nurse Communication: Mobility status;Patient requests pain meds       INGOLD,Lenise Jr 12/24/2012, 10:16 AM [facility] [phone] (pager)

## 2012-12-24 NOTE — Progress Notes (Signed)
Orthopedic Tech Progress Note Patient Details:  Clarence Davis April 10, 1960 [id number]  Patient ID: Clarence Davis, male   DOB: [birth year], 52 y.o.   MRN: [id number]   Clarence Davis 12/24/2012, 9:54 AMCalled [employer] for aspen lumbar brace.

## 2012-12-24 NOTE — Evaluation (Signed)
Occupational Therapy Evaluation Patient Details Name: Taevin Mcferran MRN: [id number] DOB: May 26, 1960 Today's Date: 12/24/2012 Time: 4098-1191 OT Time Calculation (min): 28 min  OT Assessment / Plan / Recommendation History of present illness 52 yo s/p L4-5 decompression    Clinical Impression   Patient is s/p L4-5 fusion surgery resulting in functional limitations due to the deficits listed below (see OT problem list).  Patient will benefit from skilled OT acutely to increase independence and safety with ADLS to allow discharge HHOT with RW. Evaluation limited by lethargic from medications. All education needs repetition due to poor recall at this time.     OT Assessment  Patient needs continued OT Services    Follow Up Recommendations  Home health OT    Barriers to Discharge      Equipment Recommendations  Other (comment) (RW)    Recommendations for Other Services    Frequency  Min 2X/week    Precautions / Restrictions Precautions Precautions: Back;Fall Precaution Booklet Issued: Yes (comment) Precaution Comments: Brace not ordered until this am by Dr. Newell Coral however Dr. Newell Coral ok'd PT/OT to get pt OOB to chair. (handout provided in room for back precautions) Required Braces or Orthoses: Spinal Brace Spinal Brace: Lumbar corset;Applied in sitting position Restrictions Weight Bearing Restrictions: No   Pertinent Vitals/Pain Reports 10 out 10 HA    ADL  Eating/Feeding: Set up Where Assessed - Eating/Feeding: Chair Grooming: Wash/dry face;Set up Where Assessed - Grooming: Supported sitting Toilet Transfer: +2 Total assistance Toilet Transfer: Patient Percentage: 80% Toilet Transfer Method: Sit to stand Toilet Transfer Equipment: Raised toilet seat with arms (or 3-in-1 over toilet) Equipment Used: Gait belt;Other (comment) (hHA) Transfers/Ambulation Related to ADLs: Pt completing sit<>stand with reaching for therapist support. Pt could benefit from RW next session.  Pt very lethargic and poor attention due to medications at this time so RW not attempted. ( see PT eval) ADL Comments: Pt sitting on eob on arrival and states vomitting after drinking orange juice. Pt (A)ed for return to supine. Pt provided okay for OOB without brace present. Pt needed max cueing for sequence and safety wtih bed mobility. Pt anticipates pain prior to all mobility at this time. IV found out of left hand and swelling noted. Pt requesting additional oral medication due to HA.    OT Diagnosis: Generalized weakness;Acute pain  OT Problem List: Decreased strength;Decreased activity tolerance;Impaired balance (sitting and/or standing);Decreased safety awareness;Decreased knowledge of use of DME or AE;Decreased knowledge of precautions;Pain OT Treatment Interventions: Self-care/ADL training;Therapeutic exercise;DME and/or AE instruction;Therapeutic activities;Patient/family education;Balance training   OT Goals(Current goals can be found in the care plan section) Acute Rehab OT Goals Patient Stated Goal: to go home OT Goal Formulation: With patient Time For Goal Achievement: 01/07/13 Potential to Achieve Goals: Good  Visit Information  Last OT Received On: 12/24/12 Assistance Needed: +2 (due to lethargy- check level of arousal maybe not need tech) PT/OT Co-Evaluation/Treatment: Yes History of Present Illness: 52 yo s/p L4-5 decompression        Prior Functioning     Home Living Family/patient expects to be discharged to:: Private residence Living Arrangements: Spouse/significant other Available Help at Discharge: Family;Available 24 hours/day Type of Home: House Home Access: Stairs to enter [employer] of Steps: 6 Entrance Stairs-Rails:  (states he can hold wall and door frame) Home Layout: One level Home Equipment: Shower seat - built in;Other (comment) (recliner) Additional Comments: pt plans to sleep in recliner at d/c. Pt educated on proper position in  recliner and avoiding  back precautions Prior Function Level of Independence: Independent Communication Communication: No difficulties Dominant Hand: Left         Vision/Perception Vision - History Baseline Vision: Wears glasses only for reading Patient Visual Report: No change from baseline   Cognition  Cognition Arousal/Alertness: Lethargic;Suspect due to medications Behavior During Therapy: Anxious Overall Cognitive Status: Impaired/Different from baseline Area of Impairment: Orientation;Attention;Memory;Following commands;Safety/judgement;Awareness;Problem solving Orientation Level: Disoriented to;Time Current Attention Level: Focused Memory: Decreased short-term memory Following Commands: Follows one step commands consistently;Follows one step commands with increased time Safety/Judgement: Decreased awareness of safety;Decreased awareness of deficits Awareness: Emergent;Anticipatory Problem Solving: Slow processing;Difficulty sequencing;Requires verbal cues General Comments: Pt very anxious with mobility and anticipating pain prior to any tactile input. Pt stating "wait slowly" with no tactile input from therapist.     Extremity/Trunk Assessment Upper Extremity Assessment Upper Extremity Assessment: Overall WFL for tasks assessed;Difficult to assess due to impaired cognition Lower Extremity Assessment Lower Extremity Assessment: Defer to PT evaluation Cervical / Trunk Assessment Cervical / Trunk Assessment: Normal     Mobility Bed Mobility Bed Mobility: Rolling Left;Left Sidelying to Sit;Sitting - Scoot to Edge of Bed Rolling Left: With rail;4: Min guard Left Sidelying to Sit: With rails;HOB elevated;4: Min guard Sitting - Scoot to Edge of Bed: 4: Min guard Sit to Supine: 2: Max assist;HOB elevated;With rail Details for Bed Mobility Assistance: cues for log roll and technique.   Transfers Sit to Stand: 1: +2 Total assist;With upper extremity assist;From bed Sit to  Stand: Patient Percentage: 80% Stand to Sit: 1: +2 Total assist;With upper extremity assist;To chair/3-in-1;With armrests Stand to Sit: Patient Percentage: 80% Details for Transfer Assistance: Cues for hand placement as well as cues for sequencing movement.       Exercise     Balance Balance Balance Assessed: Yes Static Standing Balance Static Standing - Balance Support: Bilateral upper extremity supported;During functional activity Static Standing - Level of Assistance: 4: Min assist Static Standing - Comment/# of Minutes: 2   End of Session OT - End of Session Activity Tolerance: Patient limited by lethargy;Patient limited by pain Patient left: in chair;with call bell/phone within reach;with chair alarm set Nurse Communication: Mobility status;Precautions  GO     Harolyn Rutherford 12/24/2012, 10:58 AM Pager: [phone]

## 2012-12-24 NOTE — Progress Notes (Signed)
Orthopedic Tech Progress Note Patient Details:  Clarence Davis 07/19/1960 [id number]  Patient ID: Clarence Davis, male   DOB: 1960-05-29, 52 y.o.   MRN: [id number]   Shawnie Pons 12/24/2012, 12:25 PMaspen lumbar brace completed by [employer].

## 2012-12-24 NOTE — Progress Notes (Signed)
Subjective: Patient sitting up in chair, with complaints of headache, neck pain, and back pain. He explained that he had the headache and neck pain even when laying flat in bed, and it is not positional. IV infiltrated, nursing staff have IV restarted.  Objective: Vital signs in last 24 hours: Filed Vitals:   12/24/12 0155 12/24/12 0336 12/24/12 0517 12/24/12 0802  BP: 108/58  116/64   Pulse: 96  93   Temp: 98.6 F (37 C)  98.7 F (37.1 C)   TempSrc: Oral  Oral   Resp: 17 20 19 22   SpO2: 98% 96% 97% 97%    Intake/Output from previous day: 09/19 0701 - 09/20 0700 In: 1700 [I.V.:1700] Out: 2130 [Urine:1360; Drains:620; Blood:150] Intake/Output this shift:    Physical Exam:  Color somewhat pale. Moving all 4 extremities.   Assessment/Plan: We'll check BMET and CBC with differential, and also obtain type and screen. Working with PT. Dr. Cassandria Santee notes indicate that he wanted the patient to have a brace, none present, we'll have lumbar ASPEN brace obtained from [employer].   Hewitt Shorts, MD 12/24/2012, 10:07 AM

## 2012-12-24 NOTE — Op Note (Signed)
Clarence Davis, Clarence Davis              ACCOUNT NO.:  [id number]  MEDICAL RECORD NO.:  [id number]  LOCATION:  [id number]                        FACILITY:  [facility]  PHYSICIAN:  Hilda Lias, M.D.   DATE OF BIRTH:  31-Jul-1960  DATE OF PROCEDURE: DATE OF DISCHARGE:                              OPERATIVE REPORT   PREOPERATIVE DIAGNOSIS:  L4-L5 lumbar stenosis secondary to spondylolisthesis with chronic radiculopathy.  POSTOPERATIVE DIAGNOSIS:  L4-L5 lumbar stenosis secondary to spondylolisthesis with chronic radiculopathy.  PROCEDURE:  L4 Gill procedure, which involved removal of the spinal process, laminae, and facets.  Bilateral L4-L5 discectomy __more than normal ________ to be able to introduce cages.  Pedicle screws L4-L5.  Introduction of 2 cages 15 x 26 at the level L4-L5, posterolateral arthrodesis with autograft, Vitoss and BMP.  Cell Saver, C-arm.  SURGEON:  Hilda Lias, M.D.  ASSISTANT:  Tia Alert, MD  CLINICAL HISTORY:  Clarence Davis is a gentleman who had been complaining of back pain worsened to both legs.  The patient has failed with conservative treatment.  The pain is getting worse, and he has failed with conservative treatment.  X-rays show grade 2 stenosis with spondylolisthesis at the level of L4-L5.  Surgery was advised.  The risks and benefits were explained to him.  PROCEDURE:  The patient was taken to the OR, and after intubation, he was positioned in a prone manner.  The back was cleaned with Betadine first and later on with DuraPrep.  Midline incision from upper 3 to L4-5 was made and muscle was retracted laterally.  X-ray showed that the upper clamp was at the level of L4.  From then on, we proceeded with a Gill procedure removing the spinous process, lamina, and facet of L4. This came easily.  The yellow ligament was also excised.  Then, retraction of the thecal sac allowed [place] to get into the L4-L5 disk space.  We removed median and laterally of the whole  herniated disk.  We went laterally to be able to accommodate the cages.  The endplates were drilled.  Then 2 cages of 15 x 26 were inserted first in the left side and then in the right side.  The cages had BMP as well as autograft. Then using the C-arm first in AP view and then a lateral view, we probed the pedicle of L4-L5.  Prior to introduction of the screws, we feel all the 4 quadrants just to be sure that we were surrounded by bone.  Then 4 screws of 5.5 x 45 were inserted and kept in place with a rod and Capps. Cross-Link from right to left was used.  From then on, we went laterally and we removed periosteum of the lateral aspect of the facet of 4-5 and the proximal transverse process of 4-5.  A mix of Vitoss, BMP, and autograft was used for arthrodesis.  The area was irrigated.  Valsalva maneuver up to 40 was negative.  Then, the wound was closed with Vicryl and Steri-Strips.  Hemovac was left in the epidural area.          ______________________________ Hilda Lias, M.D.     EB/MEDQ  D:  12/23/2012  T:  12/24/2012  Job:  [id number]

## 2012-12-24 NOTE — Progress Notes (Signed)
Hemovac pulled with tip out when patient turning in bed. Dressing to lower back reinforced.

## 2012-12-25 MED ORDER — DIAZEPAM 5 MG PO TABS: 5.0000 mg | ORAL_TABLET | Freq: Four times a day (QID) | ORAL | Status: DC | PRN

## 2012-12-25 MED ORDER — OXYCODONE-ACETAMINOPHEN 5-325 MG PO TABS: 1.0000 | ORAL_TABLET | ORAL | Status: DC | PRN

## 2012-12-25 NOTE — Progress Notes (Addendum)
   CARE MANAGEMENT NOTE 12/25/2012  Patient:  Clarence Davis,Clarence Davis   Account Number:  [id number]  Date Initiated:  12/25/2012  Documentation initiated by:  [facility]  Subjective/Objective Assessment:   ZOX:WRUEAV stenosis     Action/Plan:   discharge planning   Anticipated DC Date:  12/25/2012   Anticipated DC Plan:  HOME W HOME HEALTH SERVICES      DC Planning Services  CM consult      [facility] Choice  HOME HEALTH   Choice offered to / List presented to:     DME arranged  3-N-1  [facility]      DME agency  [facility]     HH arranged  HH-2 PT  HH-3 OT      [facility] agency  [facility]   Status of service:  Completed, signed off Medicare Important Message given?   (If response is "NO", the following Medicare IM given date fields will be blank) Date Medicare IM given:   Date Additional Medicare IM given:    Discharge Disposition:  HOME W HOME HEALTH SERVICES  Per UR Regulation:    If discussed at Long Length of Stay Meetings, dates discussed:    Comments:  12/25/12 13:22 Spoke with pt and pt's wife to offer choice for HHPT/OT.  Pt chose [facility].  Address and contact numbers verified for [facility] services.  Referral faxed to [facility].  Cane and 3n1 to be delivered to room prior to discharge. No other CM needs were communicated.  Freddy Jaksch, BSN, CM [phone].

## 2012-12-25 NOTE — Progress Notes (Signed)
Occupational Therapy Treatment Patient Details Name: Clarence Davis MRN: [id number] DOB: [birth year] Today's Date: 12/25/2012 Time: 4098-1191 OT Time Calculation (min): 24 min  OT Assessment / Plan / Recommendation  History of present illness 52 yo s/p L4-5 decompression    OT comments  Pt has made significant progress since last session. Anticipates d/c home today.  Follow Up Recommendations  Home health OT    Barriers to Discharge       Equipment Recommendations  3 in 1 bedside comode ; cane   Recommendations for Other Services    Frequency Min 2X/week   Progress towards OT Goals Progress towards OT goals: Progressing toward goals  Plan Discharge plan remains appropriate    Precautions / Restrictions Precautions Precautions: Back;Fall Precaution Booklet Issued: Yes (comment) Precaution Comments: Reviewed 3/3 back precautions. Required Braces or Orthoses: Spinal Brace Spinal Brace: Lumbar corset;Applied in sitting position   Pertinent Vitals/Pain See vitals    ADL  Upper Body Bathing: Simulated;Supervision/safety Where Assessed - Upper Body Bathing: Unsupported sitting Lower Body Bathing: Simulated;Minimal assistance Where Assessed - Lower Body Bathing: Unsupported sit to stand Upper Body Dressing: Simulated;Supervision/safety Where Assessed - Upper Body Dressing: Unsupported sitting Lower Body Dressing: Performed;Minimal assistance Where Assessed - Lower Body Dressing: Unsupported sit to stand Toilet Transfer: Simulated;Supervision/safety Toilet Transfer Method: Sit to Barista:  (bed) Equipment Used: Back brace Transfers/Ambulation Related to ADLs: supervision for safey, Incr time due to pain. ADL Comments: Pt fully dressed on OT arrival. Requiring min assist with LB ADLs. Edcuated pt on use of reacher for LB ADLs. Also educated pt and wife on use of 3n1 as a shower chair.    OT Diagnosis:    OT Problem List:   OT Treatment Interventions:      OT Goals(current goals can now be found in the care plan section) Acute Rehab OT Goals Patient Stated Goal: to go home OT Goal Formulation: With patient Time For Goal Achievement: 01/07/13 Potential to Achieve Goals: Good ADL Goals Pt Will Perform Upper Body Dressing: with modified independence;sitting Pt Will Perform Lower Body Dressing: with modified independence;sit to/from stand Pt Will Transfer to Toilet: with modified independence;regular height toilet Additional ADL Goal #1: Pt will don doff brace MOD I   Visit Information  Last OT Received On: 12/25/12 Assistance Needed: +1 History of Present Illness: 52 yo s/p L4-5 decompression     Subjective Data      Prior Functioning       Cognition  Cognition Arousal/Alertness: Awake/alert Behavior During Therapy: WFL for tasks assessed/performed Overall Cognitive Status: Within Functional Limits for tasks assessed    Mobility  Bed Mobility Bed Mobility: Rolling Left;Left Sidelying to Sit;Sitting - Scoot to Edge of Bed;Sit to Sidelying Left Rolling Left: 5: Supervision Left Sidelying to Sit: 5: Supervision;HOB flat Sitting - Scoot to Edge of Bed: 5: Supervision Details for Bed Mobility Assistance: VCs for technique. Transfers Transfers: Sit to Stand;Stand to Sit Sit to Stand: 5: Supervision;From bed Stand to Sit: 5: Supervision;To bed    Exercises      Balance     End of Session OT - End of Session Equipment Utilized During Treatment: Back brace Activity Tolerance: Patient tolerated treatment well Patient left: with family/visitor present (sitting EOB) Nurse Communication: Mobility status  GO    12/25/2012 Cipriano Mile OTR/L Pager [phone] Office [phone]  Cipriano Mile 12/25/2012, 1:48 PM

## 2012-12-25 NOTE — Progress Notes (Signed)
Physical Therapy Treatment Patient Details Name: Clarence Davis MRN: [id number] DOB: [birth year] Today's Date: 12/25/2012 Time: 4098-1191 PT Time Calculation (min): 8 min  PT Assessment / Plan / Recommendation  History of Present Illness 52 yo s/p L4-5 decompression    PT Comments   Patient doing well with mobility and gait today.  Able to ambulate 200' with cane.  Able to negotiate stairs with supervision for safety only.  Patient achieved PT goals and is ready for d/c from PT perspective.  Follow Up Recommendations  Home health PT;Supervision/Assistance - 24 hour     Does the patient have the potential to tolerate intense rehabilitation     Barriers to Discharge        Equipment Recommendations  Cane    Recommendations for Other Services    Frequency Min 6X/week   Progress towards PT Goals Progress towards PT goals: Goals met/education completed, patient discharged from PT  Plan Current plan remains appropriate;Equipment recommendations need to be updated    Precautions / Restrictions Precautions Precautions: Back Precaution Booklet Issued: Yes (comment) Precaution Comments: Patient able to state 3/3 back precautions Required Braces or Orthoses: Spinal Brace Spinal Brace: Lumbar corset;Applied in sitting position Restrictions Weight Bearing Restrictions: No   Pertinent Vitals/Pain     Mobility  Bed Mobility Bed Mobility: Rolling Left;Left Sidelying to Sit Rolling Left: 6: Modified independent (Device/Increase time);With rail Left Sidelying to Sit: 6: Modified independent (Device/Increase time);With rails;HOB flat Sitting - Scoot to Edge of Bed: 5: Supervision Details for Bed Mobility Assistance: VCs for technique. Transfers Transfers: Sit to Stand;Stand to Sit Sit to Stand: 6: Modified independent (Device/Increase time);With upper extremity assist;From bed Stand to Sit: 6: Modified independent (Device/Increase time);To bed Details for Transfer Assistance: Patient  uses correct technique. Ambulation/Gait Ambulation/Gait Assistance: 6: Modified independent (Device/Increase time) Ambulation Distance (Feet): 160 Feet Assistive device: None;Straight cane Ambulation/Gait Assistance Details: Patient with good balance and gait speed.  Able to ambulate with cane using correct sequence.  Cane for longer distance ambulation. Gait Pattern: Step-through pattern Gait velocity: WFL Stairs: Yes Stairs Assistance: 5: Supervision Stair Management Technique: One rail Right;Step to pattern;Forwards;With cane Number of Stairs: 4      PT Goals (current goals can now be found in the care plan section) Acute Rehab PT Goals Patient Stated Goal: to go home  Visit Information  Last PT Received On: 12/25/12 Assistance Needed: +1 History of Present Illness: 52 yo s/p L4-5 decompression     Subjective Data  Subjective: Ready to go home Patient Stated Goal: to go home   Cognition  Cognition Arousal/Alertness: Awake/alert Behavior During Therapy: WFL for tasks assessed/performed Overall Cognitive Status: Within Functional Limits for tasks assessed    Balance     End of Session PT - End of Session Equipment Utilized During Treatment: Back brace Activity Tolerance: Patient tolerated treatment well Patient left: in bed;with call bell/phone within reach;with family/visitor present (sitting EOB) Nurse Communication: Mobility status (Ready for d/c from PT perspective)   GP     [name] 12/25/2012, 1:52 PM Durenda Hurt. Renaldo Fiddler, [facility] Acute Rehab Services Pager [phone]

## 2012-12-25 NOTE — Discharge Summary (Signed)
Physician Discharge Summary  Patient ID: Clarence Davis MRN: [id number] DOB/AGE: May 14, 1960 52 y.o.  Admit date: 12/23/2012 Discharge date: 12/25/2012  Admission Diagnoses: lumbar stenosis   Discharge Diagnoses: same   Discharged Condition: good  Hospital Course: The patient was admitted on 12/23/2012 and taken to the operating room where the patient underwent lumbar fusion. The patient tolerated the procedure well and was taken to the recovery room and then to the floor in stable condition. The hospital course was routine. There were no complications. The wound remained clean dry and intact. Pt had appropriate back soreness. No complaints of leg pain or new N/T/W. The patient remained afebrile with stable vital signs, and tolerated a regular diet. The patient continued to increase activities, and pain was well controlled with oral pain medications.   Consults: None  Significant Diagnostic Studies:  Results for orders placed during the hospital encounter of 12/23/12  CBC WITH DIFFERENTIAL      Result Value Range   WBC 8.4  4.0 - 10.5 K/uL   RBC 3.94 (*) 4.22 - 5.81 MIL/uL   Hemoglobin 13.4  13.0 - 17.0 g/dL   HCT 40.9 (*) 81.1 - 91.4 %   MCV 97.7  78.0 - 100.0 fL   MCH 34.0  26.0 - 34.0 pg   MCHC 34.8  30.0 - 36.0 g/dL   RDW 78.2  95.6 - 21.3 %   Platelets 126 (*) 150 - 400 K/uL   Neutrophils Relative % 85 (*) 43 - 77 %   Neutro Abs 7.1  1.7 - 7.7 K/uL   Lymphocytes Relative 6 (*) 12 - 46 %   Lymphs Abs 0.5 (*) 0.7 - 4.0 K/uL   Monocytes Relative 9  3 - 12 %   Monocytes Absolute 0.7  0.1 - 1.0 K/uL   Eosinophils Relative 0  0 - 5 %   Eosinophils Absolute 0.0  0.0 - 0.7 K/uL   Basophils Relative 0  0 - 1 %   Basophils Absolute 0.0  0.0 - 0.1 K/uL  BASIC METABOLIC PANEL      Result Value Range   Sodium 132 (*) 135 - 145 mEq/L   Potassium 4.0  3.5 - 5.1 mEq/L   Chloride 97  96 - 112 mEq/L   CO2 28  19 - 32 mEq/L   Glucose, Bld 135 (*) 70 - 99 mg/dL   BUN 8  6 - 23 mg/dL    Creatinine, Ser 0.86  0.50 - 1.35 mg/dL   Calcium 8.7  8.4 - 57.8 mg/dL   GFR calc non Af Amer >90  >90 mL/min   GFR calc Af Amer >90  >90 mL/min  GLUCOSE, CAPILLARY      Result Value Range   Glucose-Capillary 148 (*) 70 - 99 mg/dL    Dg Lumbar Spine 2-3 Views  12/23/2012   *RADIOLOGY REPORT*  Clinical Data: Spinal stenosis.  Spondylolisthesis.  DG C-ARM 1-60 MIN,LUMBAR SPINE - 2-3 VIEW  Comparison: CT myelogram dated 09/22/2012  Findings: AP and lateral C-arm images demonstrate the patient is undergoing posterior and interbody fusion at L4-5.  Pedicle screws and interbody fusion device appear in good position in the AP and lateral projections.  IMPRESSION: Fusion performed at L4-5.   Original Report Authenticated By: Francene Boyers, M.D.   Dg Lumbar Spine 1 View  12/23/2012   CLINICAL DATA:  L4-5 PLIF  EXAM: LUMBAR SPINE - 1 VIEW  COMPARISON:  09/22/2012  FINDINGS: Lateral portable radiograph of the lumbar spine labeled 1.  Demonstrates surgical probes posterior to theL4-L5.  IMPRESSION: Surgical probes localized the L4 and L5 vertebra.   Electronically Signed   By: Signa Kell M.D.   On: 12/23/2012 14:58   Dg C-arm 1-60 Min  12/23/2012   *RADIOLOGY REPORT*  Clinical Data: Spinal stenosis.  Spondylolisthesis.  DG C-ARM 1-60 MIN,LUMBAR SPINE - 2-3 VIEW  Comparison: CT myelogram dated 09/22/2012  Findings: AP and lateral C-arm images demonstrate the patient is undergoing posterior and interbody fusion at L4-5.  Pedicle screws and interbody fusion device appear in good position in the AP and lateral projections.  IMPRESSION: Fusion performed at L4-5.   Original Report Authenticated By: Francene Boyers, M.D.    Antibiotics:  Anti-infectives   Start     Dose/Rate Route Frequency Ordered Stop   12/23/12 2000  ceFAZolin (ANCEF) IVPB 1 g/50 mL premix     1 g 100 mL/hr over 30 Minutes Intravenous Every 8 hours 12/23/12 1753 12/24/12 0334   12/23/12 0953  ceFAZolin (ANCEF) 2-3 GM-% IVPB SOLR     Comments:  TODD, ROBERT: cabinet override      12/23/12 0953 12/23/12 2159   12/23/12 0600  ceFAZolin (ANCEF) IVPB 2 g/50 mL premix     2 g 100 mL/hr over 30 Minutes Intravenous On call to O.R. 12/22/12 1357 12/23/12 1248      Discharge Exam: Blood pressure 102/66, pulse 93, temperature 98.6 F (37 C), temperature source Oral, resp. rate 13, SpO2 93.00%. Neurologic: Grossly normal Incision clean dry and intact  Discharge Medications:     Medication List         aspirin EC 81 MG tablet  Take 81 mg by mouth daily.     carvedilol 3.125 MG tablet  Commonly known as:  COREG  Take 3.125 mg by mouth daily.     citalopram 40 MG tablet  Commonly known as:  CELEXA  Take 40 mg by mouth daily.     diazepam 5 MG tablet  Commonly known as:  VALIUM  Take 1 tablet (5 mg total) by mouth every 6 (six) hours as needed.     HYDROcodone-acetaminophen 5-325 MG per tablet  Commonly known as:  NORCO/VICODIN  Take 1 tablet by mouth every 4 (four) hours as needed for pain.     multivitamin with minerals Tabs tablet  Take 1 tablet by mouth daily.     omeprazole 20 MG capsule  Commonly known as:  PRILOSEC  Take 20 mg by mouth daily.     oxyCODONE-acetaminophen 5-325 MG per tablet  Commonly known as:  PERCOCET/ROXICET  Take 1-2 tablets by mouth every 4 (four) hours as needed.        Disposition: home   Final Dx: lumbar fusion      Discharge Orders   Future Orders Complete By Expires   Call MD for:  difficulty breathing, headache or visual disturbances  As directed    Call MD for:  persistant nausea and vomiting  As directed    Call MD for:  redness, tenderness, or signs of infection (pain, swelling, redness, odor or green/yellow discharge around incision site)  As directed    Call MD for:  severe uncontrolled pain  As directed    Call MD for:  temperature >100.4  As directed    Diet - low sodium heart healthy  As directed    Discharge instructions  As directed    Comments:      Up and around as tolerated, no strenuous activity, no  driving, no bending or twisting, may shower normally   Increase activity slowly  As directed    Remove dressing in 24 hours  As directed       Follow-up Information   Follow up with Karn Cassis, MD In 2 weeks.   Specialty:  Neurosurgery   Contact information:   [address] [place] [address] [phone]        Signed: Tia Alert 12/25/2012, 11:24 AM

## 2012-12-26 MED FILL — Heparin Sodium (Porcine) Inj 1000 Unit/ML: INTRAMUSCULAR | Qty: 30 | Status: AC

## 2012-12-26 MED FILL — Sodium Chloride IV Soln 0.9%: INTRAVENOUS | Qty: 1000 | Status: AC

## 2012-12-26 NOTE — Anesthesia Postprocedure Evaluation (Signed)
  Anesthesia Post-op Note  Patient: Clarence Davis  Procedure(s) Performed: Procedure(s) with comments: Lumbar Four-Five Posterior lumbar interbody fusion (N/A) - POSTERIOR LUMBAR FUSION 1 LEVEL  Patient Location: PACU  Anesthesia Type:General  Level of Consciousness: awake and alert   Airway and Oxygen Therapy: Patient Spontanous Breathing  Post-op Pain: mild  Post-op Assessment: Post-op Vital signs reviewed  Post-op Vital Signs: stable  Complications: No apparent anesthesia complications

## 2013-06-30 ENCOUNTER — Other Ambulatory Visit: Payer: Self-pay | Admitting: Neurosurgery

## 2013-06-30 DIAGNOSIS — M4800 Spinal stenosis, site unspecified: Secondary | ICD-10-CM

## 2013-07-10 ENCOUNTER — Ambulatory Visit
Admission: RE | Admit: 2013-07-10 | Discharge: 2013-07-10 | Disposition: A | Discharge status: Home / Self Care | Payer: BC Managed Care – PPO | Source: Ambulatory Visit | Attending: Neurosurgery | Admitting: Neurosurgery

## 2013-07-10 VITALS — BP 104/68 | HR 85

## 2013-07-10 DIAGNOSIS — M4800 Spinal stenosis, site unspecified: Secondary | ICD-10-CM

## 2013-07-10 MED ORDER — DIAZEPAM 5 MG PO TABS
10.0000 mg | ORAL_TABLET | Freq: Once | ORAL | Status: AC
  Administered 2013-07-10: 10 mg via ORAL

## 2013-07-10 MED ORDER — ONDANSETRON HCL 4 MG/2ML IJ SOLN
4.0000 mg | Freq: Once | INTRAMUSCULAR | Status: AC
  Administered 2013-07-10: 4 mg via INTRAMUSCULAR

## 2013-07-10 MED ORDER — MEPERIDINE HCL 100 MG/ML IJ SOLN
100.0000 mg | Freq: Once | INTRAMUSCULAR | Status: AC
  Administered 2013-07-10: 100 mg via INTRAMUSCULAR

## 2013-07-10 MED ORDER — IOHEXOL 180 MG/ML  SOLN
15.0000 mL | Freq: Once | INTRAMUSCULAR | Status: AC | PRN
  Administered 2013-07-10: 15 mL via INTRATHECAL

## 2013-07-10 NOTE — Discharge Instructions (Addendum)
Myelogram Discharge Instructions  1. Go home and rest quietly for the next 24 hours.  It is important to lie flat for the next 24 hours.  Get up only to go to the restroom.  You may lie in the bed or on a couch on your back, your stomach, your left side or your right side.  You may have one pillow under your head.  You may have pillows between your knees while you are on your side or under your knees while you are on your back.  2. DO NOT drive today.  Recline the seat as far back as it will go, while still wearing your seat belt, on the way home.  3. You may get up to go to the bathroom as needed.  You may sit up for 10 minutes to eat.  You may resume your normal diet and medications unless otherwise indicated.  Drink lots of extra fluids today and tomorrow.  4. The incidence of headache, nausea, or vomiting is about 5% (one in 20 patients).  If you develop a headache, lie flat and drink plenty of fluids until the headache goes away.  Caffeinated beverages may be helpful.  If you develop severe nausea and vomiting or a headache that does not go away with flat bed rest, call [phone].  5. You may resume normal activities after your 24 hours of bed rest is over; however, do not exert yourself strongly or do any heavy lifting tomorrow. If when you get up you have a headache when standing, go back to bed and force fluids for another 24 hours.  6. Call your physician for a follow-up appointment.  The results of your myelogram will be sent directly to your physician by the following day.  7. If you have any questions or if complications develop after you arrive home, please call [phone].  Discharge instructions have been explained to the patient.  The patient, or the person responsible for the patient, fully understands these instructions.    May resume Celexa on July 11, 2013, after 8:30 am.

## 2013-07-10 NOTE — Progress Notes (Signed)
Pt states he has been off Celexa for at least 2 days.  Discharge instructions explained to pt and fiance.

## 2013-12-04 ENCOUNTER — Other Ambulatory Visit: Payer: Self-pay | Admitting: Neurosurgery

## 2013-12-13 ENCOUNTER — Encounter (HOSPITAL_COMMUNITY): Payer: Self-pay | Admitting: Pharmacy Technician

## 2013-12-14 ENCOUNTER — Encounter (HOSPITAL_COMMUNITY): Payer: Self-pay | Admitting: *Deleted

## 2013-12-14 MED ORDER — CEFAZOLIN SODIUM-DEXTROSE 2-3 GM-% IV SOLR
2.0000 g | INTRAVENOUS | Status: AC
  Administered 2013-12-15: 2 g via INTRAVENOUS
  Filled 2013-12-14: qty 50

## 2013-12-14 NOTE — Progress Notes (Signed)
12/14/13 1250  OBSTRUCTIVE SLEEP APNEA  Have you ever been diagnosed with sleep apnea through a sleep study? No  Do you snore loudly (loud enough to be heard through closed doors)?  0  Do you often feel tired, fatigued, or sleepy during the daytime? 1  Has anyone observed you stop breathing during your sleep? 1  Do you have, or are you being treated for high blood pressure? 0  BMI more than 35 kg/m2? 0  Age over 52 years old? 1  Gender: 1  Obstructive Sleep Apnea Score 4

## 2013-12-14 NOTE — Anesthesia Preprocedure Evaluation (Addendum)
Anesthesia Evaluation  Patient identified by MRN, date of birth, ID band Patient awake    Reviewed: Allergy & Precautions, H&P , NPO status , Patient's Chart, lab work & pertinent test results  Airway Mallampati: II TM Distance: >3 FB Neck ROM: Limited    Dental  (+) Edentulous Upper, Edentulous Lower   Pulmonary Current Smoker,  breath sounds clear to auscultation        Cardiovascular Rhythm:Regular Rate:Normal     Neuro/Psych  Headaches, Anxiety Depression    GI/Hepatic GERD-  Medicated,  Endo/Other    Renal/GU      Musculoskeletal  (+) Arthritis -,   Abdominal (+)  Abdomen: soft.    Peds  Hematology   Anesthesia Other Findings   Reproductive/Obstetrics                          Anesthesia Physical Anesthesia Plan  ASA: II  Anesthesia Plan: General   Post-op Pain Management:    Induction: Intravenous  Airway Management Planned: Oral ETT  Additional Equipment:   Intra-op Plan:   Post-operative Plan: Extubation in OR  Informed Consent: I have reviewed the patients History and Physical, chart, labs and discussed the procedure including the risks, benefits and alternatives for the proposed anesthesia with the patient or authorized representative who has indicated his/her understanding and acceptance.     Plan Discussed with:   Anesthesia Plan Comments:         Anesthesia Quick Evaluation

## 2013-12-14 NOTE — H&P (Signed)
Clarence Davis is a male.   Chief Complaint: arm pain HPI: patient who underwent lumbar surgery with some residual pain but now he is complaining of neck pain with radiation to both upper extremities associated with tingling sensation, no better with conservative treatment  Past Medical History  Diagnosis Date  . GERD (gastroesophageal reflux disease)     takes Omeprazole daily  . Depression     takes Citalopram daily  . History of bronchitis     many yrs ago  . History of migraine     last one on 12/15/12  . Head injury     early 87's  . Arthritis   . Chronic back pain   . Dry skin   . Hepatitis     C  . Alcoholic     quit drinking many yrs ago per pt  . Dysrhythmia     PVC's   . Chronic diarrhea   . Anxiety   . YGEFUWTK(182.8)     Past Surgical History  Procedure Laterality Date  . Left arm surgery as a child    . Left clavicle surgery    . Hernia repair      2 umbilical;left inguinal   . Colonscopy    . Esophagogastroduodenoscopy      Family History  Problem Relation Age of Onset  . Heart disease Sister    Social History:  reports that he has been smoking E-cigarettes.  He has been smoking about 0.00 packs per day for the past 25 years. He has never used smokeless tobacco. He reports that he does not drink alcohol or use illicit drugs.  Allergies:  Allergies  Allergen Reactions  . Tramadol Rash and Other (See Comments)    dizzy    No prescriptions prior to admission    No results found for this or any previous visit (from the past 48 hour(s)). No results found.  Review of Systems  Constitutional: Negative.   HENT: Positive for tinnitus.   Eyes: Negative.   Respiratory: Positive for shortness of breath.   Cardiovascular: Positive for chest pain.  Gastrointestinal: Positive for heartburn and abdominal pain.  Genitourinary: Negative.   Musculoskeletal: Positive for back pain and neck pain.  Skin: Negative.   Neurological: Positive for sensory change  and focal weakness.  Endo/Heme/Allergies: Negative.   Psychiatric/Behavioral: Positive for depression.    There were no vitals taken for this visit. Physical Exam  Hent, nl. Neck, some pain with mobility. Cv, nl. Lungs ,some rales. Abdomen, soft. Extremities, nl. NEURO weaknes of biceps and wrist extensors. Sensory nl. Mri shows stenosis at cervical 4-5, 5-6, 6-7. Assessment/Plan decompresiion and fusion from c4 to 7. He is aware of riks and  benefits  Clarence Davis M 12/14/2013, 5:13 PM

## 2013-12-15 ENCOUNTER — Encounter (HOSPITAL_COMMUNITY): Payer: Medicaid Other | Admitting: Anesthesiology

## 2013-12-15 ENCOUNTER — Inpatient Hospital Stay (HOSPITAL_COMMUNITY)
Admission: RE | Admit: 2013-12-15 | Discharge: 2013-12-16 | DRG: 473 | Disposition: A | Discharge status: Home / Self Care | Payer: Medicaid Other | Source: Ambulatory Visit | Attending: Neurosurgery | Admitting: Neurosurgery

## 2013-12-15 ENCOUNTER — Encounter (HOSPITAL_COMMUNITY): Payer: Self-pay | Admitting: Certified Registered Nurse Anesthetist

## 2013-12-15 ENCOUNTER — Encounter (HOSPITAL_COMMUNITY)
Admission: RE | Disposition: A | Discharge status: Home / Self Care | Payer: Self-pay | Source: Ambulatory Visit | Attending: Neurosurgery

## 2013-12-15 ENCOUNTER — Inpatient Hospital Stay (HOSPITAL_COMMUNITY): Payer: Medicaid Other

## 2013-12-15 ENCOUNTER — Inpatient Hospital Stay (HOSPITAL_COMMUNITY): Payer: Medicaid Other | Admitting: Anesthesiology

## 2013-12-15 DIAGNOSIS — M549 Dorsalgia, unspecified: Secondary | ICD-10-CM | POA: Diagnosis present

## 2013-12-15 DIAGNOSIS — F329 Major depressive disorder, single episode, unspecified: Secondary | ICD-10-CM | POA: Diagnosis present

## 2013-12-15 DIAGNOSIS — F17200 Nicotine dependence, unspecified, uncomplicated: Secondary | ICD-10-CM | POA: Diagnosis present

## 2013-12-15 DIAGNOSIS — K219 Gastro-esophageal reflux disease without esophagitis: Secondary | ICD-10-CM | POA: Diagnosis present

## 2013-12-15 DIAGNOSIS — F3289 Other specified depressive episodes: Secondary | ICD-10-CM | POA: Diagnosis present

## 2013-12-15 DIAGNOSIS — M79609 Pain in unspecified limb: Secondary | ICD-10-CM | POA: Diagnosis present

## 2013-12-15 DIAGNOSIS — M4802 Spinal stenosis, cervical region: Principal | ICD-10-CM | POA: Diagnosis present

## 2013-12-15 DIAGNOSIS — M129 Arthropathy, unspecified: Secondary | ICD-10-CM | POA: Diagnosis present

## 2013-12-15 DIAGNOSIS — G8929 Other chronic pain: Secondary | ICD-10-CM | POA: Diagnosis present

## 2013-12-15 HISTORY — DX: Noninfective gastroenteritis and colitis, unspecified: K52.9

## 2013-12-15 HISTORY — DX: Anxiety disorder, unspecified: F41.9

## 2013-12-15 HISTORY — PX: ANTERIOR CERVICAL DECOMP/DISCECTOMY FUSION: SHX1161

## 2013-12-15 HISTORY — DX: Spinal stenosis, cervical region: M48.02

## 2013-12-15 LAB — CBC
HEMATOCRIT: 44.6 % (ref 39.0–52.0)
Hemoglobin: 15.3 g/dL (ref 13.0–17.0)
MCH: 33.1 pg (ref 26.0–34.0)
MCHC: 34.3 g/dL (ref 30.0–36.0)
MCV: 96.5 fL (ref 78.0–100.0)
PLATELETS: 161 10*3/uL (ref 150–400)
RBC: 4.62 MIL/uL (ref 4.22–5.81)
RDW: 13.5 % (ref 11.5–15.5)
WBC: 6.4 10*3/uL (ref 4.0–10.5)

## 2013-12-15 LAB — SURGICAL PCR SCREEN
MRSA, PCR: NEGATIVE
Staphylococcus aureus: NEGATIVE

## 2013-12-15 LAB — BASIC METABOLIC PANEL
Anion gap: 12 (ref 5–15)
BUN: 8 mg/dL (ref 6–23)
CO2: 26 mEq/L (ref 19–32)
Calcium: 9.7 mg/dL (ref 8.4–10.5)
Chloride: 103 mEq/L (ref 96–112)
Creatinine, Ser: 0.77 mg/dL (ref 0.50–1.35)
GFR calc Af Amer: >90 mL/min (ref >90)
GFR calc non Af Amer: >90 mL/min (ref >90)
GLUCOSE: 104 mg/dL — AB (ref 70–99)
POTASSIUM: 4.5 meq/L (ref 3.7–5.3)
SODIUM: 141 meq/L (ref 137–147)

## 2013-12-15 SURGERY — ANTERIOR CERVICAL DECOMPRESSION/DISCECTOMY FUSION 3 LEVELS
Anesthesia: General

## 2013-12-15 MED ORDER — PROPOFOL 10 MG/ML IV BOLUS
INTRAVENOUS | Status: DC | PRN
  Administered 2013-12-15: 140 mg via INTRAVENOUS

## 2013-12-15 MED ORDER — ONDANSETRON HCL 4 MG/2ML IJ SOLN
INTRAMUSCULAR | Status: DC | PRN
  Administered 2013-12-15: 4 mg via INTRAVENOUS

## 2013-12-15 MED ORDER — ARTIFICIAL TEARS OP OINT
TOPICAL_OINTMENT | OPHTHALMIC | Status: DC | PRN
  Administered 2013-12-15: 1 via OPHTHALMIC

## 2013-12-15 MED ORDER — THROMBIN 5000 UNITS EX SOLR
OROMUCOSAL | Status: DC | PRN
  Administered 2013-12-15: 11:00:00 via TOPICAL

## 2013-12-15 MED ORDER — FENTANYL CITRATE 0.05 MG/ML IJ SOLN
INTRAMUSCULAR | Status: AC
  Filled 2013-12-15: qty 2

## 2013-12-15 MED ORDER — PHENOL 1.4 % MT LIQD
1.0000 | OROMUCOSAL | Status: DC | PRN
Start: 2013-12-15 — End: 2013-12-16
  Filled 2013-12-15: qty 177

## 2013-12-15 MED ORDER — LIDOCAINE HCL (CARDIAC) 20 MG/ML IV SOLN
INTRAVENOUS | Status: AC
  Filled 2013-12-15: qty 5

## 2013-12-15 MED ORDER — MIDAZOLAM HCL 2 MG/2ML IJ SOLN
INTRAMUSCULAR | Status: AC
  Filled 2013-12-15: qty 2

## 2013-12-15 MED ORDER — CEFAZOLIN SODIUM 1-5 GM-% IV SOLN
1.0000 g | Freq: Three times a day (TID) | INTRAVENOUS | Status: AC
  Administered 2013-12-15 – 2013-12-16 (×2): 1 g via INTRAVENOUS
  Filled 2013-12-15 (×2): qty 50

## 2013-12-15 MED ORDER — LIDOCAINE HCL (CARDIAC) 20 MG/ML IV SOLN
INTRAVENOUS | Status: DC | PRN
  Administered 2013-12-15: 100 mg via INTRAVENOUS

## 2013-12-15 MED ORDER — LACTATED RINGERS IV SOLN
INTRAVENOUS | Status: DC | PRN
  Administered 2013-12-15 (×2): via INTRAVENOUS

## 2013-12-15 MED ORDER — MENTHOL 3 MG MT LOZG
1.0000 | LOZENGE | OROMUCOSAL | Status: DC | PRN
  Administered 2013-12-15: 3 mg via ORAL
  Filled 2013-12-15 (×2): qty 9

## 2013-12-15 MED ORDER — DEXAMETHASONE SODIUM PHOSPHATE 10 MG/ML IJ SOLN
INTRAMUSCULAR | Status: DC | PRN
  Administered 2013-12-15: 10 mg via INTRAVENOUS

## 2013-12-15 MED ORDER — DEXAMETHASONE 4 MG PO TABS
4.0000 mg | ORAL_TABLET | Freq: Four times a day (QID) | ORAL | Status: DC
  Administered 2013-12-15: 4 mg via ORAL
  Filled 2013-12-15: qty 1

## 2013-12-15 MED ORDER — DIAZEPAM 5 MG PO TABS
5.0000 mg | ORAL_TABLET | Freq: Four times a day (QID) | ORAL | Status: DC | PRN
  Administered 2013-12-15 – 2013-12-16 (×3): 5 mg via ORAL
  Filled 2013-12-15 (×2): qty 1

## 2013-12-15 MED ORDER — SODIUM CHLORIDE 0.9 % IJ SOLN: 3.0000 mL | Freq: Two times a day (BID) | INTRAMUSCULAR | Status: DC

## 2013-12-15 MED ORDER — MUPIROCIN 2 % EX OINT
1.0000 "application " | TOPICAL_OINTMENT | Freq: Once | CUTANEOUS | Status: AC
  Administered 2013-12-15: 1 via TOPICAL

## 2013-12-15 MED ORDER — MUPIROCIN 2 % EX OINT
TOPICAL_OINTMENT | CUTANEOUS | Status: AC
  Filled 2013-12-15: qty 22

## 2013-12-15 MED ORDER — DIAZEPAM 5 MG PO TABS
ORAL_TABLET | ORAL | Status: AC
  Filled 2013-12-15: qty 1

## 2013-12-15 MED ORDER — ROCURONIUM BROMIDE 50 MG/5ML IV SOLN
INTRAVENOUS | Status: AC
  Filled 2013-12-15: qty 1

## 2013-12-15 MED ORDER — SODIUM CHLORIDE 0.9 % IV SOLN: 250.0000 mL | INTRAVENOUS | Status: DC

## 2013-12-15 MED ORDER — ACETAMINOPHEN 325 MG PO TABS: 650.0000 mg | ORAL_TABLET | ORAL | Status: DC | PRN

## 2013-12-15 MED ORDER — FENTANYL CITRATE 0.05 MG/ML IJ SOLN
INTRAMUSCULAR | Status: AC
  Filled 2013-12-15: qty 5

## 2013-12-15 MED ORDER — PROMETHAZINE HCL 25 MG/ML IJ SOLN: 6.2500 mg | INTRAMUSCULAR | Status: DC | PRN

## 2013-12-15 MED ORDER — PHENYLEPHRINE HCL 10 MG/ML IJ SOLN
INTRAMUSCULAR | Status: DC | PRN
  Administered 2013-12-15 (×2): 80 ug via INTRAVENOUS

## 2013-12-15 MED ORDER — MIDAZOLAM HCL 5 MG/5ML IJ SOLN
INTRAMUSCULAR | Status: DC | PRN
  Administered 2013-12-15: 2 mg via INTRAVENOUS

## 2013-12-15 MED ORDER — MEPERIDINE HCL 25 MG/ML IJ SOLN: 6.2500 mg | INTRAMUSCULAR | Status: DC | PRN

## 2013-12-15 MED ORDER — PROPOFOL 10 MG/ML IV BOLUS
INTRAVENOUS | Status: AC
  Filled 2013-12-15: qty 20

## 2013-12-15 MED ORDER — PANTOPRAZOLE SODIUM 40 MG IV SOLR
40.0000 mg | Freq: Every day | INTRAVENOUS | Status: DC
  Administered 2013-12-15: 40 mg via INTRAVENOUS
  Filled 2013-12-15: qty 40

## 2013-12-15 MED ORDER — FENTANYL CITRATE 0.05 MG/ML IJ SOLN
25.0000 ug | INTRAMUSCULAR | Status: DC | PRN
  Administered 2013-12-15 (×2): 50 ug via INTRAVENOUS

## 2013-12-15 MED ORDER — ACETAMINOPHEN 650 MG RE SUPP: 650.0000 mg | RECTAL | Status: DC | PRN

## 2013-12-15 MED ORDER — ROCURONIUM BROMIDE 100 MG/10ML IV SOLN
INTRAVENOUS | Status: DC | PRN
  Administered 2013-12-15: 20 mg via INTRAVENOUS
  Administered 2013-12-15: 30 mg via INTRAVENOUS
  Administered 2013-12-15: 50 mg via INTRAVENOUS

## 2013-12-15 MED ORDER — OXYCODONE-ACETAMINOPHEN 5-325 MG PO TABS
ORAL_TABLET | ORAL | Status: AC
  Filled 2013-12-15: qty 2

## 2013-12-15 MED ORDER — SODIUM CHLORIDE 0.9 % IJ SOLN: 3.0000 mL | INTRAMUSCULAR | Status: DC | PRN

## 2013-12-15 MED ORDER — SODIUM CHLORIDE 0.9 % IV SOLN: INTRAVENOUS | Status: DC

## 2013-12-15 MED ORDER — LACTATED RINGERS IV SOLN: INTRAVENOUS | Status: DC

## 2013-12-15 MED ORDER — EPHEDRINE SULFATE 50 MG/ML IJ SOLN
INTRAMUSCULAR | Status: DC | PRN
  Administered 2013-12-15: 10 mg via INTRAVENOUS
  Administered 2013-12-15: 5 mg via INTRAVENOUS
  Administered 2013-12-15: 10 mg via INTRAVENOUS

## 2013-12-15 MED ORDER — THROMBIN 20000 UNITS EX SOLR
CUTANEOUS | Status: DC | PRN
  Administered 2013-12-15 (×2): via TOPICAL

## 2013-12-15 MED ORDER — DEXAMETHASONE SODIUM PHOSPHATE 4 MG/ML IJ SOLN
4.0000 mg | Freq: Four times a day (QID) | INTRAMUSCULAR | Status: DC
  Administered 2013-12-15 – 2013-12-16 (×2): 4 mg via INTRAVENOUS
  Filled 2013-12-15 (×2): qty 1

## 2013-12-15 MED ORDER — ONDANSETRON HCL 4 MG/2ML IJ SOLN
INTRAMUSCULAR | Status: AC
  Filled 2013-12-15: qty 2

## 2013-12-15 MED ORDER — NEOSTIGMINE METHYLSULFATE 10 MG/10ML IV SOLN
INTRAVENOUS | Status: DC | PRN
  Administered 2013-12-15: 3 mg via INTRAVENOUS

## 2013-12-15 MED ORDER — 0.9 % SODIUM CHLORIDE (POUR BTL) OPTIME
TOPICAL | Status: DC | PRN
  Administered 2013-12-15: 1000 mL

## 2013-12-15 MED ORDER — MORPHINE SULFATE 2 MG/ML IJ SOLN
1.0000 mg | INTRAMUSCULAR | Status: DC | PRN
  Administered 2013-12-15: 2 mg via INTRAVENOUS
  Administered 2013-12-15 – 2013-12-16 (×3): 4 mg via INTRAVENOUS
  Filled 2013-12-15 (×3): qty 2
  Filled 2013-12-15: qty 1

## 2013-12-15 MED ORDER — OXYCODONE-ACETAMINOPHEN 5-325 MG PO TABS
1.0000 | ORAL_TABLET | ORAL | Status: DC | PRN
  Administered 2013-12-15 – 2013-12-16 (×4): 2 via ORAL
  Filled 2013-12-15 (×3): qty 2

## 2013-12-15 MED ORDER — ZOLPIDEM TARTRATE 5 MG PO TABS: 5.0000 mg | ORAL_TABLET | Freq: Every evening | ORAL | Status: DC | PRN

## 2013-12-15 MED ORDER — GLYCOPYRROLATE 0.2 MG/ML IJ SOLN
INTRAMUSCULAR | Status: DC | PRN
  Administered 2013-12-15: .6 mg via INTRAVENOUS

## 2013-12-15 MED ORDER — ONDANSETRON HCL 4 MG/2ML IJ SOLN: 4.0000 mg | INTRAMUSCULAR | Status: DC | PRN

## 2013-12-15 MED ORDER — CITALOPRAM HYDROBROMIDE 40 MG PO TABS
40.0000 mg | ORAL_TABLET | Freq: Every day | ORAL | Status: DC
  Filled 2013-12-15: qty 1

## 2013-12-15 MED ORDER — FENTANYL CITRATE 0.05 MG/ML IJ SOLN
INTRAMUSCULAR | Status: DC | PRN
  Administered 2013-12-15 (×2): 50 ug via INTRAVENOUS
  Administered 2013-12-15: 100 ug via INTRAVENOUS

## 2013-12-15 SURGICAL SUPPLY — 73 items
BENZOIN TINCTURE PRP APPL 2/3 (GAUZE/BANDAGES/DRESSINGS) ×3 IMPLANT
BIT DRILL SM SPINE QC 14 (BIT) ×3 IMPLANT
BLADE ULTRA TIP 2M (BLADE) ×3 IMPLANT
BNDG GAUZE ELAST 4 BULKY (GAUZE/BANDAGES/DRESSINGS) ×6 IMPLANT
BUR BARREL STRAIGHT FLUTE 4.0 (BURR) IMPLANT
BUR MATCHSTICK NEURO 3.0 LAGG (BURR) ×3 IMPLANT
CANISTER SUCT 3000ML (MISCELLANEOUS) ×3 IMPLANT
CLOSURE WOUND 1/2 X4 (GAUZE/BANDAGES/DRESSINGS) ×1
CONT SPEC 4OZ CLIKSEAL STRL BL (MISCELLANEOUS) ×3 IMPLANT
COVER MAYO STAND STRL (DRAPES) ×3 IMPLANT
DRAPE C-ARM 42X72 X-RAY (DRAPES) ×6 IMPLANT
DRAPE LAPAROTOMY 100X72 PEDS (DRAPES) ×3 IMPLANT
DRAPE MICROSCOPE LEICA (MISCELLANEOUS) ×3 IMPLANT
DRAPE POUCH INSTRU U-SHP 10X18 (DRAPES) ×3 IMPLANT
DRAPE PROXIMA HALF (DRAPES) IMPLANT
DRSG OPSITE POSTOP 4X6 (GAUZE/BANDAGES/DRESSINGS) ×3 IMPLANT
DURAPREP 6ML APPLICATOR 50/CS (WOUND CARE) ×3 IMPLANT
ELECT REM PT RETURN 9FT ADLT (ELECTROSURGICAL) ×3
ELECTRODE REM PT RTRN 9FT ADLT (ELECTROSURGICAL) ×1 IMPLANT
GAUZE SPONGE 4X4 12PLY STRL (GAUZE/BANDAGES/DRESSINGS) ×3 IMPLANT
GAUZE SPONGE 4X4 16PLY XRAY LF (GAUZE/BANDAGES/DRESSINGS) IMPLANT
GLOVE BIO SURGEON STRL SZ 6.5 (GLOVE) IMPLANT
GLOVE BIO SURGEON STRL SZ7 (GLOVE) IMPLANT
GLOVE BIO SURGEON STRL SZ7.5 (GLOVE) IMPLANT
GLOVE BIO SURGEON STRL SZ8 (GLOVE) ×3 IMPLANT
GLOVE BIO SURGEON STRL SZ8.5 (GLOVE) IMPLANT
GLOVE BIO SURGEONS STRL SZ 6.5 (GLOVE)
GLOVE BIOGEL M 8.0 STRL (GLOVE) ×3 IMPLANT
GLOVE ECLIPSE 6.5 STRL STRAW (GLOVE) IMPLANT
GLOVE ECLIPSE 7.0 STRL STRAW (GLOVE) IMPLANT
GLOVE ECLIPSE 7.5 STRL STRAW (GLOVE) IMPLANT
GLOVE ECLIPSE 8.0 STRL XLNG CF (GLOVE) IMPLANT
GLOVE ECLIPSE 8.5 STRL (GLOVE) IMPLANT
GLOVE EXAM NITRILE LRG STRL (GLOVE) IMPLANT
GLOVE EXAM NITRILE MD LF STRL (GLOVE) IMPLANT
GLOVE EXAM NITRILE XL STR (GLOVE) IMPLANT
GLOVE EXAM NITRILE XS STR PU (GLOVE) IMPLANT
GLOVE INDICATOR 6.5 STRL GRN (GLOVE) IMPLANT
GLOVE INDICATOR 7.0 STRL GRN (GLOVE) IMPLANT
GLOVE INDICATOR 7.5 STRL GRN (GLOVE) ×3 IMPLANT
GLOVE INDICATOR 8.0 STRL GRN (GLOVE) IMPLANT
GLOVE INDICATOR 8.5 STRL (GLOVE) IMPLANT
GLOVE OPTIFIT SS 8.0 STRL (GLOVE) IMPLANT
GLOVE SURG SS PI 6.5 STRL IVOR (GLOVE) IMPLANT
GLOVE SURG SS PI 7.0 STRL IVOR (GLOVE) ×6 IMPLANT
GOWN STRL REUS W/ TWL LRG LVL3 (GOWN DISPOSABLE) ×2 IMPLANT
GOWN STRL REUS W/ TWL XL LVL3 (GOWN DISPOSABLE) ×1 IMPLANT
GOWN STRL REUS W/TWL 2XL LVL3 (GOWN DISPOSABLE) IMPLANT
GOWN STRL REUS W/TWL LRG LVL3 (GOWN DISPOSABLE) ×4
GOWN STRL REUS W/TWL XL LVL3 (GOWN DISPOSABLE) ×2
GRAFT DURAGEN MATRIX 1WX1L (Tissue) ×3 IMPLANT
HALTER HD/CHIN CERV TRACTION D (MISCELLANEOUS) ×3 IMPLANT
HEMOSTAT POWDER KIT SURGIFOAM (HEMOSTASIS) IMPLANT
KIT BASIN OR (CUSTOM PROCEDURE TRAY) ×3 IMPLANT
KIT ROOM TURNOVER OR (KITS) ×3 IMPLANT
NEEDLE SPNL 22GX3.5 QUINCKE BK (NEEDLE) ×3 IMPLANT
NS IRRIG 1000ML POUR BTL (IV SOLUTION) ×3 IMPLANT
PACK LAMINECTOMY NEURO (CUSTOM PROCEDURE TRAY) ×3 IMPLANT
PATTIES SURGICAL .5 X1 (DISPOSABLE) ×3 IMPLANT
PLATE ANT CERV XTEND 3 LV 54 (Plate) ×3 IMPLANT
PUTTY DBX 1CC (Putty) ×3 IMPLANT
PUTTY DBX 1CC DEPUY (Putty) ×1 IMPLANT
RUBBERBAND STERILE (MISCELLANEOUS) ×6 IMPLANT
SCREW XTD VAR 4.2 SELF TAP (Screw) ×24 IMPLANT
SPACER COLONIAL 7X14X12 (Spacer) ×9 IMPLANT
SPONGE INTESTINAL PEANUT (DISPOSABLE) ×6 IMPLANT
SPONGE SURGIFOAM ABS GEL 100 (HEMOSTASIS) ×3 IMPLANT
STRIP CLOSURE SKIN 1/2X4 (GAUZE/BANDAGES/DRESSINGS) ×2 IMPLANT
SUT VIC AB 3-0 SH 8-18 (SUTURE) ×3 IMPLANT
SYR 20ML ECCENTRIC (SYRINGE) ×3 IMPLANT
TOWEL OR 17X24 6PK STRL BLUE (TOWEL DISPOSABLE) ×3 IMPLANT
TOWEL OR 17X26 10 PK STRL BLUE (TOWEL DISPOSABLE) ×3 IMPLANT
WATER STERILE IRR 1000ML POUR (IV SOLUTION) ×3 IMPLANT

## 2013-12-15 NOTE — Progress Notes (Signed)
Orthopedic Tech Progress Note Patient Details:  Clarence Davis 28-Apr-1960 [id number] Applied soft cervical collar. Ortho Devices Type of Ortho Device: Soft collar Ortho Device/Splint Interventions: Application   Darrol Poke 12/15/2013, 1:07 PM

## 2013-12-15 NOTE — Anesthesia Postprocedure Evaluation (Signed)
  Anesthesia Post-op Note  Patient: Clarence Davis  Procedure(s) Performed: Procedure(s) with comments: CERVICAL FOUR TO FIVE, CERVICAL FIVE TO SIX, CERVICAL SIX TO SEVEN CERVICAL DECOMPRESSION/DISCECTOMY FUSION 3 LEVELS (N/A) - C4-5 C5-6 C6-7 Anterior cervical decompression/diskectomy/fusion  Patient Location: PACU  Anesthesia Type:General  Level of Consciousness: awake, alert  and oriented  Airway and Oxygen Therapy: Patient Spontanous Breathing and Patient connected to nasal cannula oxygen  Post-op Pain: mild  Post-op Assessment: Post-op Vital signs reviewed, Patient's Cardiovascular Status Stable, Respiratory Function Stable and Patent Airway  Post-op Vital Signs: Reviewed and stable  Last Vitals:  Filed Vitals:   12/15/13 1230  BP:   Pulse: 106  Temp:   Resp: 17    Complications: No apparent anesthesia complications

## 2013-12-15 NOTE — Transfer of Care (Signed)
Immediate Anesthesia Transfer of Care Note  Patient: Clarence Davis  Procedure(s) Performed: Procedure(s) with comments: CERVICAL FOUR TO FIVE, CERVICAL FIVE TO SIX, CERVICAL SIX TO SEVEN CERVICAL DECOMPRESSION/DISCECTOMY FUSION 3 LEVELS (N/A) - C4-5 C5-6 C6-7 Anterior cervical decompression/diskectomy/fusion  Patient Location: PACU  Anesthesia Type:General  Level of Consciousness: awake, alert  and oriented  Airway & Oxygen Therapy: Patient Spontanous Breathing and Patient connected to nasal cannula oxygen  Post-op Assessment: Report given to PACU RN, Post -op Vital signs reviewed and stable and Patient moving all extremities X 4  Post vital signs: Reviewed and stable  Complications: No apparent anesthesia complications

## 2013-12-15 NOTE — Anesthesia Procedure Notes (Signed)
Procedure Name: Intubation Date/Time: 12/15/2013 9:16 AM Performed by: Ollen Bowl Pre-anesthesia Checklist: Patient identified, Emergency Drugs available, Suction available, Patient being monitored and Timeout performed Patient Re-evaluated:Patient Re-evaluated prior to inductionOxygen Delivery Method: Circle system utilized and Simple face mask Preoxygenation: Pre-oxygenation with 100% oxygen Intubation Type: IV induction Ventilation: Mask ventilation without difficulty Grade View: Grade I Tube size: 7.5 mm Number of attempts: 1 Airway Equipment and Method: Patient positioned with wedge pillow,  Stylet and Video-laryngoscopy (elective glidescope) Placement Confirmation: ETT inserted through vocal cords under direct vision,  positive ETCO2 and breath sounds checked- equal and bilateral Secured at: 22 cm Tube secured with: Tape Dental Injury: Teeth and Oropharynx as per pre-operative assessment

## 2013-12-16 MED ORDER — OXYCODONE-ACETAMINOPHEN 10-325 MG PO TABS: 1.0000 | ORAL_TABLET | ORAL | Status: DC | PRN

## 2013-12-16 MED ORDER — DIAZEPAM 5 MG PO TABS: 5.0000 mg | ORAL_TABLET | Freq: Four times a day (QID) | ORAL | Status: DC | PRN

## 2013-12-16 NOTE — Evaluation (Signed)
Physical Therapy Evaluation Patient Details Name: Clarence Davis MRN: [id number] DOB: 1961-02-22 Today's Date: 12/16/2013   History of Present Illness  53 y.o. male s/p C4-C7 ACDF 12/15/13.  Clinical Impression  Patient evaluated by Physical Therapy with no further acute PT needs identified. All education regarding cervical precautions has been completed and the patient has no further questions.  See below for any follow-up Physial Therapy or equipment needs. PT is signing off. Thank you for this referral.     Follow Up Recommendations No PT follow up    Equipment Recommendations  None recommended by PT    Recommendations for Other Services       Precautions / Restrictions Precautions Precautions: Cervical Precaution Comments: given handout and educated on precautions and restrictions Required Braces or Orthoses: Cervical Brace Cervical Brace: Soft collar;At all times Restrictions Weight Bearing Restrictions: No      Mobility  Bed Mobility Overal bed mobility: Modified Independent             General bed mobility comments: demo log rolling technique from previous surgery  Transfers Overall transfer level: Modified independent Equipment used: None             General transfer comment: incr time; no physical (A) needed  Ambulation/Gait Ambulation/Gait assistance: Modified independent (Device/Increase time) Ambulation Distance (Feet): 550 Feet Assistive device: None Gait Pattern/deviations: WFL(Within Functional Limits) Gait velocity: guarded due to pain Gait velocity interpretation: Below normal speed for age/gender General Gait Details: pt with guarded speed but no balance deficits noted; good safety awareness throughout   Stairs Stairs: Yes Stairs assistance: Modified independent (Device/Increase time) Stair Management: Alternating pattern;No rails;Forwards Number of Stairs: 3 General stair comments: good safety awareness with stair management    Wheelchair Mobility    Modified Rankin (Stroke Patients Only)       Balance Overall balance assessment: Modified Independent;No apparent balance deficits (not formally assessed)                                           Pertinent Vitals/Pain Pain Assessment: 0-10 Pain Score: 9  Pain Location: surgical site Pain Descriptors / Indicators: Aching;Constant Pain Intervention(s): Monitored during session;Premedicated before session    Home Living Family/patient expects to be discharged to:: Private residence Living Arrangements: Spouse/significant other Available Help at Discharge: Family;Available 24 hours/day Type of Home: House Home Access: Stairs to enter Entrance Stairs-Rails: None Entrance Stairs-Number of Steps: 1 Home Layout: One level Home Equipment: Cane - single point;Shower seat      Prior Function Level of Independence: Independent         Comments: ambulates with cane as needed due to back pain      Hand Dominance   Dominant Hand: Right    Extremity/Trunk Assessment   Upper Extremity Assessment: Overall WFL for tasks assessed           Lower Extremity Assessment: Overall WFL for tasks assessed      Cervical / Trunk Assessment: Normal  Communication   Communication: No difficulties  Cognition Arousal/Alertness: Awake/alert Behavior During Therapy: WFL for tasks assessed/performed Overall Cognitive Status: Within Functional Limits for tasks assessed                      General Comments General comments (skin integrity, edema, etc.): reviewed cervical precautions extensively and answered all questions    Exercises  Assessment/Plan    PT Assessment Patent does not need any further PT services  PT Diagnosis     PT Problem List    PT Treatment Interventions     PT Goals (Current goals can be found in the Care Plan section) Acute Rehab PT Goals Patient Stated Goal: home today PT Goal Formulation:  No goals set, d/c therapy    Frequency     Barriers to discharge        Co-evaluation               End of Session Equipment Utilized During Treatment: Cervical collar Activity Tolerance: Patient tolerated treatment well Patient left: in bed;with call bell/phone within reach Nurse Communication: Mobility status;Precautions         Time: 0713-0727 PT Time Calculation (min): 14 min   Charges:   PT Evaluation $Initial PT Evaluation Tier I: 1 Procedure PT Treatments $Gait Training: 8-22 mins   PT G CodesGustavus Bryant, [place]  [phone] 12/16/2013, 8:00 AM

## 2013-12-16 NOTE — Progress Notes (Signed)
1040 - discharge instructions and prescriptions given to pt. Pt verbally acknowledged understanding. Pt voiced no questions when prompted. Iv catheter removed by nurse without difficulty, intact. Pt tolerated well. Pt to be transported to home by private car by family member who is currently at bedside. Pt to be transported per wheelchair to lobby by tech, Elnoria Howard. Will monitor Clarence Davis I 12/16/2013 10:52 AM

## 2013-12-16 NOTE — Discharge Summary (Signed)
  Physician Discharge Summary  Patient ID: Clarence Davis MRN: [id number] DOB/AGE: 05/29/1960 53 y.o.  Admit date: 12/15/2013 Discharge date: 12/16/2013  Admission Diagnoses: C4-5, C5-6 and C6-7 spinal stenosis, cervicalgia, cervical radiculopathy  Discharge Diagnoses: The same Active Problems:   Cervical stenosis of spinal canal   Discharged Condition: good  Hospital Course: Dr. Joya Salm performed a C4-5, C5-6 and C6-7 anterior cervical discectomy, fusion, and plating on the patient on 12/15/2013.  The patient's postoperative course was unremarkable. On postoperative day #1 he requested discharge to home. The patient was given oral and written discharge instructions. All his questions were answered.  Consults: None Significant Diagnostic Studies: None Treatments: C4-5, C5-6 and C6-7 anterior cervical discectomy, fusion, and plating Discharge Exam: Blood pressure 136/79, pulse 97, temperature 97.9 F (36.6 C), temperature source Oral, resp. rate 18, height 5\' 11"  (1.803 m), weight 87.998 kg (194 lb), SpO2 96.00%. The patient is alert and pleasant. He is moving all 4 extremities well. His dressing is clean and dry. There is no hematoma or shift.  Disposition: Home     Medication List    ASK your doctor about these medications       citalopram 40 MG tablet  Commonly known as:  CELEXA  Take 40 mg by mouth daily after supper.     multivitamin with minerals Tabs tablet  Take 1 tablet by mouth daily.     omeprazole 20 MG capsule  Commonly known as:  PRILOSEC  Take 20 mg by mouth daily after supper.         SignedOphelia Charter 12/16/2013, 8:20 AM

## 2013-12-16 NOTE — Op Note (Signed)
NAMETILLMON, KISLING              ACCOUNT NO.:  [id number]  MEDICAL RECORD NO.:  [id number]  LOCATION:  [id number]                        FACILITY:  [facility]  PHYSICIAN:  Leeroy Cha, M.D.   DATE OF BIRTH:  April 12, 1960  DATE OF PROCEDURE:  12/15/2013 DATE OF DISCHARGE:                              OPERATIVE REPORT   PREOPERATIVE DIAGNOSIS:  Cervical stenosis with chronic radiculopathy C4- 5, C5-6, C6-7.  POSTOPERATIVE DIAGNOSIS:  Cervical stenosis with chronic radiculopathy C4-5, C5-6, C6-7.  PROCEDURE:  Anterior C4-5, C5-6, C6-7 diskectomy, decompression of the spinal cord, removal of calcified ligament posteriorly, foraminotomy interbody fusion with cages, plate from C4 to C7, microscope.  SURGEON:  Leeroy Cha, M.D.  ASSISTANT:  Sherley Bounds, MD.  CLINICAL HISTORY:  Mr. Revolorio is a gentleman, who in the past underwent lumbar fusion.  He had been complaining of pain in his neck with weakness and pain going to both upper extremities.  X-ray shows stenosis from C4 down to C6-7.  Surgery was advised.  The patient was aware of the risks and benefits.  PROCEDURE IN DETAIL:  The patient was taken to the OR, after intubation the left side of the neck was cleaned with DuraPrep.  Transverse incision was made through the skin, subcutaneous tissue, platysma all the way down to the cervical spine.  X-rays showed that we were at the level of 4-5.  From then on, we removed a large osteophyte about 4-5, 5- 6, 6-7, and we started with dissection at the level of 4-5.  The patient had no space and to get into the posterior ligament, we had to drill the disk area.  The posterior ligament was calcified and incision was made. A small opening was achieved with the microscope and using the 1 and 2 mm Kerrison punch, we decompressed the spinal cord as well as the C5 nerve root.  There was a small amount of arachnoid pouch and a piece of DuraGen was applied.  There was plenty of space for the nerve  root.  The same procedure was done at the level of 5-6 and 6-7 with decompression of the spinal cord as well as the C6 and C7 nerve root.  Plenty of space was achieved.  From then on, the area was irrigated.  The endplate were drilled and three cages of 7 mm height, lordotic with DBX and autograft were inserted.  This was followed with a plate using 8 screws.  Lateral cervical spine showed good position of the plate mostly superiorly. Because of the shoulder, we were unable to see the lower plate.  From then on, the area was irrigated.  Hemostasis was achieved.  I waited 10 minute before I closed the wound just to be sure there was no any bleeding.  From then on, the wound was closed with Vicryl and Steri- Strips.         ______________________________ Leeroy Cha, M.D.    EB/MEDQ  D:  12/15/2013  T:  12/16/2013  Job:  [id number]

## 2013-12-18 ENCOUNTER — Encounter (HOSPITAL_COMMUNITY): Payer: Self-pay | Admitting: Neurosurgery

## 2013-12-18 NOTE — Progress Notes (Signed)
RETRO REVIEW/ UR COMPLETED 

## 2014-04-23 ENCOUNTER — Other Ambulatory Visit: Payer: Self-pay | Admitting: Neurosurgery

## 2014-04-23 DIAGNOSIS — M48061 Spinal stenosis, lumbar region without neurogenic claudication: Secondary | ICD-10-CM

## 2014-07-03 LAB — HM HEPATITIS C SCREENING LAB: HM Hepatitis Screen: NEGATIVE

## 2015-01-07 ENCOUNTER — Other Ambulatory Visit: Payer: Self-pay | Admitting: Neurosurgery

## 2015-01-07 DIAGNOSIS — M5412 Radiculopathy, cervical region: Secondary | ICD-10-CM

## 2015-01-07 DIAGNOSIS — M5416 Radiculopathy, lumbar region: Secondary | ICD-10-CM

## 2015-01-07 DIAGNOSIS — M5414 Radiculopathy, thoracic region: Secondary | ICD-10-CM

## 2015-01-22 ENCOUNTER — Ambulatory Visit
Admission: RE | Admit: 2015-01-22 | Discharge: 2015-01-22 | Disposition: A | Discharge status: Home / Self Care | Payer: 59 | Source: Ambulatory Visit | Attending: Neurosurgery | Admitting: Neurosurgery

## 2015-01-22 VITALS — BP 114/78 | HR 92

## 2015-01-22 DIAGNOSIS — M5414 Radiculopathy, thoracic region: Secondary | ICD-10-CM

## 2015-01-22 DIAGNOSIS — M5416 Radiculopathy, lumbar region: Secondary | ICD-10-CM

## 2015-01-22 DIAGNOSIS — M5412 Radiculopathy, cervical region: Secondary | ICD-10-CM

## 2015-01-22 DIAGNOSIS — M4802 Spinal stenosis, cervical region: Secondary | ICD-10-CM

## 2015-01-22 MED ORDER — IOHEXOL 300 MG/ML  SOLN
10.0000 mL | Freq: Once | INTRAMUSCULAR | Status: DC | PRN
  Administered 2015-01-22: 10 mL via INTRATHECAL

## 2015-01-22 MED ORDER — ONDANSETRON HCL 4 MG/2ML IJ SOLN
4.0000 mg | Freq: Once | INTRAMUSCULAR | Status: AC
Start: 2015-01-22 — End: 2015-01-22
  Administered 2015-01-22: 4 mg via INTRAMUSCULAR

## 2015-01-22 MED ORDER — OXYCODONE-ACETAMINOPHEN 5-325 MG PO TABS
2.0000 | ORAL_TABLET | Freq: Once | ORAL | Status: AC
  Administered 2015-01-22: 2 via ORAL

## 2015-01-22 MED ORDER — MEPERIDINE HCL 100 MG/ML IJ SOLN
100.0000 mg | Freq: Once | INTRAMUSCULAR | Status: AC
Start: 2015-01-22 — End: 2015-01-22
  Administered 2015-01-22: 100 mg via INTRAMUSCULAR

## 2015-01-22 MED ORDER — DIAZEPAM 5 MG PO TABS
10.0000 mg | ORAL_TABLET | Freq: Once | ORAL | Status: AC
  Administered 2015-01-22: 10 mg via ORAL

## 2015-01-22 NOTE — Discharge Instructions (Signed)
Myelogram Discharge Instructions  1. Go home and rest quietly for the next 24 hours.  It is important to lie flat for the next 24 hours.  Get up only to go to the restroom.  You may lie in the bed or on a couch on your back, your stomach, your left side or your right side.  You may have one pillow under your head.  You may have pillows between your knees while you are on your side or under your knees while you are on your back.  2. DO NOT drive today.  Recline the seat as far back as it will go, while still wearing your seat belt, on the way home.  3. You may get up to go to the bathroom as needed.  You may sit up for 10 minutes to eat.  You may resume your normal diet and medications unless otherwise indicated.  Drink lots of extra fluids today and tomorrow.  4. The incidence of headache, nausea, or vomiting is about 5% (one in 20 patients).  If you develop a headache, lie flat and drink plenty of fluids until the headache goes away.  Caffeinated beverages may be helpful.  If you develop severe nausea and vomiting or a headache that does not go away with flat bed rest, call [phone].  5. You may resume normal activities after your 24 hours of bed rest is over; however, do not exert yourself strongly or do any heavy lifting tomorrow. If when you get up you have a headache when standing, go back to bed and force fluids for another 24 hours.  6. Call your physician for a follow-up appointment.  The results of your myelogram will be sent directly to your physician by the following day.  7. If you have any questions or if complications develop after you arrive home, please call [phone].  Discharge instructions have been explained to the patient.  The patient, or the person responsible for the patient, fully understands these instructions.       May resume Citalopram on Jan 23, 2015, after 9:30 am.

## 2015-01-22 NOTE — Progress Notes (Signed)
Patient states he has been off Citalopram for at least the past two days.   

## 2015-03-12 ENCOUNTER — Other Ambulatory Visit: Payer: Self-pay | Admitting: Neurosurgery

## 2015-03-21 ENCOUNTER — Inpatient Hospital Stay (HOSPITAL_COMMUNITY)
Admission: RE | Admit: 2015-03-21 | Discharge: 2015-03-21 | Disposition: A | Discharge status: Home / Self Care | Payer: 59 | Source: Ambulatory Visit

## 2015-03-21 NOTE — Pre-Procedure Instructions (Signed)
    Kristopher Sisak  03/21/2015      [id number] - [address], [address] AT [place] [address] [place] [address] Phone: [phone] Fax: [phone]    Your procedure is scheduled on 03-29-2015    Friday    Report to [facility] Admitting at 6:00 A.M.   Call this number if you have problems the morning of surgery:  [phone]   Remember:  Do not eat food or drink liquids after midnight.   Take these medicines the morning of surgery with A SIP OF WATER citalopram(celexa),cyclobenzaprine(flexeril).omeprazole(prilosec)   Do not wear jewelry,.  Do not wear lotions, powders, or perfumes.  You may not wear deodorant.  Do not shave 48 hours prior to surgery.  Men may shave face and neck.   Do not bring valuables to the hospital.  [facility] is not responsible for any belongings or valuables.  Contacts, dentures or bridgework may not be worn into surgery.  Leave your suitcase in the car.  After surgery it may be brought to your room.  For patients admitted to the hospital, discharge time will be determined by your treatment team.  Patients discharged the day of surgery will not be allowed to drive home.    Special instructions:  See attached Sheet for instructions on CHG showers  Please read over the following fact sheets that you were given. Pain Booklet, Blood Transfusion Information and Surgical Site Infection Prevention

## 2015-04-02 ENCOUNTER — Encounter (HOSPITAL_COMMUNITY)
Admission: RE | Admit: 2015-04-02 | Discharge: 2015-04-02 | Disposition: A | Discharge status: Home / Self Care | Payer: 59 | Source: Ambulatory Visit | Attending: Neurosurgery | Admitting: Neurosurgery

## 2015-04-02 ENCOUNTER — Encounter (HOSPITAL_COMMUNITY): Payer: Self-pay

## 2015-04-02 HISTORY — DX: Carpal tunnel syndrome, bilateral upper limbs: G56.03

## 2015-04-02 LAB — COMPREHENSIVE METABOLIC PANEL
ALBUMIN: 4.1 g/dL (ref 3.5–5.0)
ALK PHOS: 63 U/L (ref 38–126)
ALT: 18 U/L (ref 17–63)
AST: 20 U/L (ref 15–41)
Anion gap: 7 (ref 5–15)
BUN: 6 mg/dL (ref 6–20)
CALCIUM: 9.6 mg/dL (ref 8.9–10.3)
CHLORIDE: 106 mmol/L (ref 101–111)
CO2: 27 mmol/L (ref 22–32)
CREATININE: 0.83 mg/dL (ref 0.61–1.24)
Glucose, Bld: 78 mg/dL (ref 65–99)
Potassium: 4.5 mmol/L (ref 3.5–5.1)
SODIUM: 140 mmol/L (ref 135–145)
Total Bilirubin: 0.6 mg/dL (ref 0.3–1.2)
Total Protein: 6.7 g/dL (ref 6.5–8.1)

## 2015-04-02 LAB — CBC
HEMATOCRIT: 43.5 % (ref 39.0–52.0)
HEMOGLOBIN: 14.8 g/dL (ref 13.0–17.0)
MCH: 33 pg (ref 26.0–34.0)
MCHC: 34 g/dL (ref 30.0–36.0)
MCV: 96.9 fL (ref 78.0–100.0)
Platelets: 149 10*3/uL — ABNORMAL LOW (ref 150–400)
RBC: 4.49 MIL/uL (ref 4.22–5.81)
RDW: 13.6 % (ref 11.5–15.5)
WBC: 5.5 10*3/uL (ref 4.0–10.5)

## 2015-04-02 LAB — SURGICAL PCR SCREEN
MRSA, PCR: NEGATIVE
Staphylococcus aureus: NEGATIVE

## 2015-04-02 LAB — TYPE AND SCREEN
ABO/RH(D): A POS
ANTIBODY SCREEN: NEGATIVE

## 2015-04-02 NOTE — Progress Notes (Signed)
   04/02/15 1223  OBSTRUCTIVE SLEEP APNEA  Have you ever been diagnosed with sleep apnea through a sleep study? No  Do you snore loudly (loud enough to be heard through closed doors)?  1  Do you often feel tired, fatigued, or sleepy during the daytime (such as falling asleep during driving or talking to someone)? 1  Has anyone observed you stop breathing during your sleep? 1  Do you have, or are you being treated for high blood pressure? 0  BMI more than 35 kg/m2? 0  Age > 50 (1-yes) 1  Neck circumference greater than:Male 16 inches or larger, Male 17inches or larger? 0  Male Gender (Yes=1) 1  Obstructive Sleep Apnea Score 5  Score 5 or greater  Results sent to PCP

## 2015-04-02 NOTE — Progress Notes (Signed)
Patient states that back in 2012, had some chest discomfort, was seen and had stress test (normal result).  He thought it was due to pork, whiich he has a diagnosis of GERD and takes omeprazole.   Has had no other problems with starting the PPI, and has not seen a cardio since that time. (all this happpened in [place])

## 2015-04-02 NOTE — Pre-Procedure Instructions (Signed)
Clarence Davis  04/02/2015      [facility] - [address], [place] - [address] AT [place] [address] [place] [address] Phone: [phone] Fax: [phone]    Your procedure is scheduled on  Friday, Dec 30th   Report to [facility] Admitting at 7:00 AM  Call this number if you have problems the morning of surgery:  [phone]   Remember:  Do not eat food or drink liquids after midnight Thursday   Take these medicines the morning of surgery with A SIP OF WATER : Pain Medication or Flexeril   Do not wear jewelry -  No rings or watches.  Do not wear lotions or colognes.  You may NOT wear deodorant the day of surgery.             Men may shave face and neck.   Do not bring valuables to the hospital.  [facility] is not responsible for any belongings or valuables.  Contacts, dentures or bridgework may not be worn into surgery.  Leave your suitcase in the car.  After surgery it may be brought to your room. For patients admitted to the hospital, discharge time will be determined by your treatment team.    Name and phone number of your driver:   DEBRA  RAMOS -- significant other   Please read over the following fact sheets that you were given. Pain Booklet, Coughing and Deep Breathing, Blood Transfusion Information, MRSA Information and Surgical Site Infection Prevention

## 2015-04-04 MED ORDER — CEFAZOLIN SODIUM-DEXTROSE 2-3 GM-% IV SOLR
2.0000 g | INTRAVENOUS | Status: AC
  Administered 2015-04-05: 2 g via INTRAVENOUS
  Filled 2015-04-04: qty 50

## 2015-04-05 ENCOUNTER — Encounter (HOSPITAL_COMMUNITY)
Admission: RE | Disposition: A | Discharge status: Home / Self Care | Payer: Self-pay | Source: Ambulatory Visit | Attending: Neurosurgery

## 2015-04-05 ENCOUNTER — Inpatient Hospital Stay (HOSPITAL_COMMUNITY): Payer: 59

## 2015-04-05 ENCOUNTER — Inpatient Hospital Stay (HOSPITAL_COMMUNITY): Payer: 59 | Admitting: Anesthesiology

## 2015-04-05 ENCOUNTER — Inpatient Hospital Stay (HOSPITAL_COMMUNITY)
Admission: RE | Admit: 2015-04-05 | Discharge: 2015-04-10 | DRG: 473 | Disposition: A | Discharge status: Home / Self Care | Payer: 59 | Source: Ambulatory Visit | Attending: Neurosurgery | Admitting: Neurosurgery

## 2015-04-05 ENCOUNTER — Encounter (HOSPITAL_COMMUNITY): Payer: Self-pay | Admitting: *Deleted

## 2015-04-05 DIAGNOSIS — M4312 Spondylolisthesis, cervical region: Secondary | ICD-10-CM | POA: Diagnosis present

## 2015-04-05 DIAGNOSIS — M549 Dorsalgia, unspecified: Secondary | ICD-10-CM | POA: Diagnosis present

## 2015-04-05 DIAGNOSIS — G8929 Other chronic pain: Secondary | ICD-10-CM | POA: Diagnosis present

## 2015-04-05 DIAGNOSIS — F329 Major depressive disorder, single episode, unspecified: Secondary | ICD-10-CM | POA: Diagnosis present

## 2015-04-05 DIAGNOSIS — K219 Gastro-esophageal reflux disease without esophagitis: Secondary | ICD-10-CM | POA: Diagnosis present

## 2015-04-05 DIAGNOSIS — M1990 Unspecified osteoarthritis, unspecified site: Secondary | ICD-10-CM | POA: Diagnosis present

## 2015-04-05 DIAGNOSIS — M960 Pseudarthrosis after fusion or arthrodesis: Secondary | ICD-10-CM | POA: Diagnosis present

## 2015-04-05 DIAGNOSIS — Z791 Long term (current) use of non-steroidal anti-inflammatories (NSAID): Secondary | ICD-10-CM | POA: Diagnosis not present

## 2015-04-05 DIAGNOSIS — Z87891 Personal history of nicotine dependence: Secondary | ICD-10-CM

## 2015-04-05 DIAGNOSIS — M542 Cervicalgia: Secondary | ICD-10-CM | POA: Diagnosis present

## 2015-04-05 DIAGNOSIS — Y838 Other surgical procedures as the cause of abnormal reaction of the patient, or of later complication, without mention of misadventure at the time of the procedure: Secondary | ICD-10-CM | POA: Diagnosis present

## 2015-04-05 DIAGNOSIS — Z419 Encounter for procedure for purposes other than remedying health state, unspecified: Secondary | ICD-10-CM

## 2015-04-05 DIAGNOSIS — S129XXA Fracture of neck, unspecified, initial encounter: Secondary | ICD-10-CM

## 2015-04-05 DIAGNOSIS — G5603 Carpal tunnel syndrome, bilateral upper limbs: Secondary | ICD-10-CM | POA: Diagnosis present

## 2015-04-05 HISTORY — DX: Unspecified viral hepatitis C without hepatic coma: B19.20

## 2015-04-05 HISTORY — DX: Headache: R51

## 2015-04-05 HISTORY — DX: Headache, unspecified: R51.9

## 2015-04-05 HISTORY — DX: Fracture of neck, unspecified, initial encounter: S12.9XXA

## 2015-04-05 HISTORY — PX: POSTERIOR CERVICAL FUSION/FORAMINOTOMY: SHX5038

## 2015-04-05 SURGERY — POSTERIOR CERVICAL FUSION/FORAMINOTOMY LEVEL 4
Anesthesia: General | Site: Neck

## 2015-04-05 MED ORDER — ONDANSETRON HCL 4 MG/2ML IJ SOLN
INTRAMUSCULAR | Status: DC | PRN
  Administered 2015-04-05: 4 mg via INTRAVENOUS

## 2015-04-05 MED ORDER — OXYCODONE-ACETAMINOPHEN 5-325 MG PO TABS
1.0000 | ORAL_TABLET | ORAL | Status: DC | PRN
  Administered 2015-04-05: 1 via ORAL
  Administered 2015-04-06 – 2015-04-10 (×13): 2 via ORAL
  Filled 2015-04-05 (×13): qty 2

## 2015-04-05 MED ORDER — PROMETHAZINE HCL 25 MG/ML IJ SOLN: 6.2500 mg | INTRAMUSCULAR | Status: DC | PRN

## 2015-04-05 MED ORDER — STERILE WATER FOR INJECTION IJ SOLN
INTRAMUSCULAR | Status: AC
  Filled 2015-04-05: qty 30

## 2015-04-05 MED ORDER — PHENYLEPHRINE 40 MCG/ML (10ML) SYRINGE FOR IV PUSH (FOR BLOOD PRESSURE SUPPORT)
PREFILLED_SYRINGE | INTRAVENOUS | Status: AC
  Filled 2015-04-05: qty 20

## 2015-04-05 MED ORDER — BACITRACIN ZINC 500 UNIT/GM EX OINT
TOPICAL_OINTMENT | CUTANEOUS | Status: DC | PRN
  Administered 2015-04-05: 1 via TOPICAL

## 2015-04-05 MED ORDER — MENTHOL 3 MG MT LOZG: 1.0000 | LOZENGE | OROMUCOSAL | Status: DC | PRN

## 2015-04-05 MED ORDER — ESMOLOL HCL 100 MG/10ML IV SOLN
INTRAVENOUS | Status: AC
  Filled 2015-04-05: qty 10

## 2015-04-05 MED ORDER — VECURONIUM BROMIDE 10 MG IV SOLR
INTRAVENOUS | Status: AC
  Filled 2015-04-05: qty 30

## 2015-04-05 MED ORDER — VECURONIUM BROMIDE 10 MG IV SOLR
INTRAVENOUS | Status: DC | PRN
  Administered 2015-04-05 (×3): 2 mg via INTRAVENOUS

## 2015-04-05 MED ORDER — DIPHENHYDRAMINE HCL 50 MG/ML IJ SOLN
12.5000 mg | Freq: Four times a day (QID) | INTRAMUSCULAR | Status: DC | PRN
  Administered 2015-04-05: 12.5 mg via INTRAVENOUS
  Filled 2015-04-05: qty 1

## 2015-04-05 MED ORDER — SODIUM CHLORIDE 0.9 % IV SOLN
INTRAVENOUS | Status: DC
  Administered 2015-04-05: 1 mL via INTRAVENOUS
  Administered 2015-04-06: 10:00:00 via INTRAVENOUS
  Administered 2015-04-07: 1 mL via INTRAVENOUS

## 2015-04-05 MED ORDER — LIDOCAINE HCL (CARDIAC) 20 MG/ML IV SOLN
INTRAVENOUS | Status: AC
  Filled 2015-04-05: qty 10

## 2015-04-05 MED ORDER — ROCURONIUM BROMIDE 50 MG/5ML IV SOLN
INTRAVENOUS | Status: AC
  Filled 2015-04-05: qty 2

## 2015-04-05 MED ORDER — 0.9 % SODIUM CHLORIDE (POUR BTL) OPTIME
TOPICAL | Status: DC | PRN
  Administered 2015-04-05: 1000 mL

## 2015-04-05 MED ORDER — PHENYLEPHRINE HCL 10 MG/ML IJ SOLN
10.0000 mg | INTRAVENOUS | Status: DC | PRN
  Administered 2015-04-05: 15 ug/min via INTRAVENOUS

## 2015-04-05 MED ORDER — ACETAMINOPHEN 325 MG PO TABS
650.0000 mg | ORAL_TABLET | ORAL | Status: DC | PRN
  Administered 2015-04-08: 650 mg via ORAL
  Filled 2015-04-05: qty 2

## 2015-04-05 MED ORDER — PHENOL 1.4 % MT LIQD
1.0000 | OROMUCOSAL | Status: DC | PRN
Start: 2015-04-05 — End: 2015-04-10

## 2015-04-05 MED ORDER — MIDAZOLAM HCL 2 MG/2ML IJ SOLN
INTRAMUSCULAR | Status: AC
  Filled 2015-04-05: qty 2

## 2015-04-05 MED ORDER — BUPIVACAINE-EPINEPHRINE 0.5% -1:200000 IJ SOLN
INTRAMUSCULAR | Status: DC | PRN
  Administered 2015-04-05: 20 mL

## 2015-04-05 MED ORDER — OXYCODONE-ACETAMINOPHEN 5-325 MG PO TABS
ORAL_TABLET | ORAL | Status: AC
  Filled 2015-04-05: qty 1

## 2015-04-05 MED ORDER — ONDANSETRON HCL 4 MG/2ML IJ SOLN: 4.0000 mg | Freq: Four times a day (QID) | INTRAMUSCULAR | Status: DC | PRN

## 2015-04-05 MED ORDER — SUCCINYLCHOLINE CHLORIDE 20 MG/ML IJ SOLN
INTRAMUSCULAR | Status: AC
  Filled 2015-04-05: qty 1

## 2015-04-05 MED ORDER — MORPHINE SULFATE 2 MG/ML IV SOLN
INTRAVENOUS | Status: AC
  Administered 2015-04-05: 2 mg via INTRAVENOUS
  Filled 2015-04-05: qty 25

## 2015-04-05 MED ORDER — THROMBIN 5000 UNITS EX SOLR
CUTANEOUS | Status: DC | PRN
  Administered 2015-04-05: 17:00:00 via TOPICAL

## 2015-04-05 MED ORDER — SODIUM CHLORIDE 0.9 % IJ SOLN
9.0000 mL | INTRAMUSCULAR | Status: DC | PRN
Start: 2015-04-05 — End: 2015-04-06

## 2015-04-05 MED ORDER — MORPHINE SULFATE 2 MG/ML IV SOLN
INTRAVENOUS | Status: DC
  Administered 2015-04-05: 2 mg via INTRAVENOUS
  Administered 2015-04-06: 1 mg via INTRAVENOUS
  Administered 2015-04-06: 2 mg via INTRAVENOUS
  Administered 2015-04-06: 12 mg via INTRAVENOUS
  Administered 2015-04-06: 21 mg via INTRAVENOUS
  Administered 2015-04-06: 29 mg via INTRAVENOUS
  Filled 2015-04-05: qty 25

## 2015-04-05 MED ORDER — FENTANYL CITRATE (PF) 250 MCG/5ML IJ SOLN
INTRAMUSCULAR | Status: AC
  Filled 2015-04-05: qty 5

## 2015-04-05 MED ORDER — NEOSTIGMINE METHYLSULFATE 10 MG/10ML IV SOLN
INTRAVENOUS | Status: AC
  Filled 2015-04-05: qty 4

## 2015-04-05 MED ORDER — THROMBIN 5000 UNITS EX SOLR
CUTANEOUS | Status: DC | PRN
  Administered 2015-04-05 (×2): 5000 [IU] via TOPICAL

## 2015-04-05 MED ORDER — SODIUM CHLORIDE 0.9 % IJ SOLN
3.0000 mL | Freq: Two times a day (BID) | INTRAMUSCULAR | Status: DC
  Administered 2015-04-05 – 2015-04-10 (×8): 3 mL via INTRAVENOUS

## 2015-04-05 MED ORDER — HYDROMORPHONE HCL 1 MG/ML IJ SOLN
0.2500 mg | INTRAMUSCULAR | Status: DC | PRN
  Administered 2015-04-05 (×4): 0.5 mg via INTRAVENOUS

## 2015-04-05 MED ORDER — PROPOFOL 10 MG/ML IV BOLUS
INTRAVENOUS | Status: DC | PRN
  Administered 2015-04-05: 150 mg via INTRAVENOUS

## 2015-04-05 MED ORDER — DIAZEPAM 5 MG PO TABS
ORAL_TABLET | ORAL | Status: AC
  Filled 2015-04-05: qty 1

## 2015-04-05 MED ORDER — MEPERIDINE HCL 25 MG/ML IJ SOLN: 6.2500 mg | INTRAMUSCULAR | Status: DC | PRN

## 2015-04-05 MED ORDER — SODIUM CHLORIDE 0.9 % IV SOLN
250.0000 mL | INTRAVENOUS | Status: DC
  Administered 2015-04-05: 250 mL via INTRAVENOUS

## 2015-04-05 MED ORDER — GLYCOPYRROLATE 0.2 MG/ML IJ SOLN
INTRAMUSCULAR | Status: AC
  Filled 2015-04-05: qty 4

## 2015-04-05 MED ORDER — CITALOPRAM HYDROBROMIDE 20 MG PO TABS
30.0000 mg | ORAL_TABLET | Freq: Every day | ORAL | Status: DC
  Administered 2015-04-06 – 2015-04-10 (×5): 30 mg via ORAL
  Filled 2015-04-05 (×6): qty 1

## 2015-04-05 MED ORDER — PROPOFOL 10 MG/ML IV BOLUS
INTRAVENOUS | Status: AC
  Filled 2015-04-05: qty 20

## 2015-04-05 MED ORDER — HYDROMORPHONE HCL 1 MG/ML IJ SOLN
INTRAMUSCULAR | Status: AC
  Filled 2015-04-05: qty 2

## 2015-04-05 MED ORDER — PHENYLEPHRINE HCL 10 MG/ML IJ SOLN
INTRAMUSCULAR | Status: DC | PRN
  Administered 2015-04-05: 80 ug via INTRAVENOUS

## 2015-04-05 MED ORDER — ONDANSETRON HCL 4 MG/2ML IJ SOLN
INTRAMUSCULAR | Status: AC
  Filled 2015-04-05: qty 4

## 2015-04-05 MED ORDER — SODIUM CHLORIDE 0.9 % IJ SOLN: 3.0000 mL | INTRAMUSCULAR | Status: DC | PRN

## 2015-04-05 MED ORDER — CEFAZOLIN SODIUM 1-5 GM-% IV SOLN
1.0000 g | Freq: Three times a day (TID) | INTRAVENOUS | Status: AC
  Administered 2015-04-05 – 2015-04-06 (×2): 1 g via INTRAVENOUS
  Filled 2015-04-05 (×2): qty 50

## 2015-04-05 MED ORDER — MIDAZOLAM HCL 5 MG/5ML IJ SOLN
INTRAMUSCULAR | Status: DC | PRN
  Administered 2015-04-05: 2 mg via INTRAVENOUS

## 2015-04-05 MED ORDER — SODIUM CHLORIDE 0.9 % IJ SOLN
INTRAMUSCULAR | Status: AC
  Filled 2015-04-05: qty 10

## 2015-04-05 MED ORDER — DIPHENHYDRAMINE HCL 12.5 MG/5ML PO ELIX: 12.5000 mg | ORAL_SOLUTION | Freq: Four times a day (QID) | ORAL | Status: DC | PRN

## 2015-04-05 MED ORDER — LIDOCAINE HCL (CARDIAC) 20 MG/ML IV SOLN
INTRAVENOUS | Status: DC | PRN
  Administered 2015-04-05: 100 mg via INTRAVENOUS

## 2015-04-05 MED ORDER — NALOXONE HCL 0.4 MG/ML IJ SOLN: 0.4000 mg | INTRAMUSCULAR | Status: DC | PRN

## 2015-04-05 MED ORDER — ZOLPIDEM TARTRATE 5 MG PO TABS
10.0000 mg | ORAL_TABLET | Freq: Every evening | ORAL | Status: DC | PRN
  Administered 2015-04-05: 10 mg via ORAL
  Filled 2015-04-05: qty 2

## 2015-04-05 MED ORDER — ARTIFICIAL TEARS OP OINT
TOPICAL_OINTMENT | OPHTHALMIC | Status: AC
  Filled 2015-04-05: qty 7

## 2015-04-05 MED ORDER — LACTATED RINGERS IV SOLN
INTRAVENOUS | Status: DC
  Administered 2015-04-05 (×3): via INTRAVENOUS

## 2015-04-05 MED ORDER — LABETALOL HCL 5 MG/ML IV SOLN
INTRAVENOUS | Status: AC
  Filled 2015-04-05: qty 4

## 2015-04-05 MED ORDER — EPHEDRINE SULFATE 50 MG/ML IJ SOLN
INTRAMUSCULAR | Status: AC
  Filled 2015-04-05: qty 1

## 2015-04-05 MED ORDER — DIAZEPAM 5 MG PO TABS
5.0000 mg | ORAL_TABLET | Freq: Four times a day (QID) | ORAL | Status: DC | PRN
  Administered 2015-04-05 – 2015-04-10 (×11): 5 mg via ORAL
  Filled 2015-04-05 (×10): qty 1

## 2015-04-05 MED ORDER — ONDANSETRON HCL 4 MG/2ML IJ SOLN: 4.0000 mg | INTRAMUSCULAR | Status: DC | PRN

## 2015-04-05 MED ORDER — ACETAMINOPHEN 650 MG RE SUPP: 650.0000 mg | RECTAL | Status: DC | PRN

## 2015-04-05 MED ORDER — HEMOSTATIC AGENTS (NO CHARGE) OPTIME
TOPICAL | Status: DC | PRN
  Administered 2015-04-05: 1 via TOPICAL

## 2015-04-05 MED ORDER — FENTANYL CITRATE (PF) 100 MCG/2ML IJ SOLN
INTRAMUSCULAR | Status: DC | PRN
  Administered 2015-04-05 (×3): 50 ug via INTRAVENOUS
  Administered 2015-04-05: 25 ug via INTRAVENOUS
  Administered 2015-04-05 (×2): 50 ug via INTRAVENOUS
  Administered 2015-04-05: 75 ug via INTRAVENOUS
  Administered 2015-04-05: 50 ug via INTRAVENOUS

## 2015-04-05 MED ORDER — ROCURONIUM BROMIDE 100 MG/10ML IV SOLN
INTRAVENOUS | Status: DC | PRN
  Administered 2015-04-05: 50 mg via INTRAVENOUS

## 2015-04-05 SURGICAL SUPPLY — 76 items
BENZOIN TINCTURE PRP APPL 2/3 (GAUZE/BANDAGES/DRESSINGS) IMPLANT
BIT DRILL 3.5 SHORT (BIT) ×2 IMPLANT
BIT DRILL 3.5MM SHORT (BIT) ×1
BLADE CLIPPER SURG (BLADE) ×3 IMPLANT
BLADE SURG 11 STRL SS (BLADE) IMPLANT
BLADE ULTRA TIP 2M (BLADE) ×3 IMPLANT
BRUSH SCRUB EZ 1% IODOPHOR (MISCELLANEOUS) IMPLANT
BRUSH SCRUB EZ PLAIN DRY (MISCELLANEOUS) IMPLANT
BUR MATCHSTICK NEURO 3.0 LAGG (BURR) ×3 IMPLANT
CANISTER SUCT 3000ML PPV (MISCELLANEOUS) ×3 IMPLANT
CAP LOCKING (Cap) ×11 IMPLANT
CLOSURE WOUND 1/2 X4 (GAUZE/BANDAGES/DRESSINGS)
COVER MAYO STAND STRL (DRAPES) ×3 IMPLANT
DECANTER SPIKE VIAL GLASS SM (MISCELLANEOUS) IMPLANT
DRAPE C-ARM 42X72 X-RAY (DRAPES) ×6 IMPLANT
DRAPE LAPAROTOMY 100X72 PEDS (DRAPES) ×3 IMPLANT
DRAPE MICROSCOPE LEICA (MISCELLANEOUS) IMPLANT
DRAPE ORTHO SPLIT 77X108 STRL (DRAPES)
DRAPE POUCH INSTRU U-SHP 10X18 (DRAPES) ×3 IMPLANT
DRAPE SURG ORHT 6 SPLT 77X108 (DRAPES) IMPLANT
DRSG OPSITE POSTOP 4X8 (GAUZE/BANDAGES/DRESSINGS) ×3 IMPLANT
DURAPREP 6ML APPLICATOR 50/CS (WOUND CARE) ×3 IMPLANT
ELECT BLADE 4.0 EZ CLEAN MEGAD (MISCELLANEOUS) ×3
ELECT REM PT RETURN 9FT ADLT (ELECTROSURGICAL) ×3
ELECTRODE BLDE 4.0 EZ CLN MEGD (MISCELLANEOUS) ×1 IMPLANT
ELECTRODE REM PT RTRN 9FT ADLT (ELECTROSURGICAL) ×1 IMPLANT
EVACUATOR 1/8 PVC DRAIN (DRAIN) ×3 IMPLANT
GAUZE SPONGE 4X4 12PLY STRL (GAUZE/BANDAGES/DRESSINGS) IMPLANT
GAUZE SPONGE 4X4 16PLY XRAY LF (GAUZE/BANDAGES/DRESSINGS) IMPLANT
GLOVE BIO SURGEON STRL SZ 6 (GLOVE) ×6 IMPLANT
GLOVE BIO SURGEON STRL SZ8 (GLOVE) ×3 IMPLANT
GLOVE BIO SURGEON STRL SZ8.5 (GLOVE) ×3 IMPLANT
GLOVE BIOGEL M 8.0 STRL (GLOVE) ×6 IMPLANT
GLOVE BIOGEL PI IND STRL 6.5 (GLOVE) ×1 IMPLANT
GLOVE BIOGEL PI INDICATOR 6.5 (GLOVE) ×2
GLOVE EXAM NITRILE LRG STRL (GLOVE) IMPLANT
GLOVE EXAM NITRILE MD LF STRL (GLOVE) IMPLANT
GLOVE EXAM NITRILE XL STR (GLOVE) IMPLANT
GLOVE EXAM NITRILE XS STR PU (GLOVE) IMPLANT
GOWN STRL REUS W/ TWL LRG LVL3 (GOWN DISPOSABLE) ×2 IMPLANT
GOWN STRL REUS W/ TWL XL LVL3 (GOWN DISPOSABLE) ×2 IMPLANT
GOWN STRL REUS W/TWL 2XL LVL3 (GOWN DISPOSABLE) IMPLANT
GOWN STRL REUS W/TWL LRG LVL3 (GOWN DISPOSABLE) ×4
GOWN STRL REUS W/TWL XL LVL3 (GOWN DISPOSABLE) ×4
HEMOSTAT POWDER KIT SURGIFOAM (HEMOSTASIS) ×3 IMPLANT
IMPL QUARTEX 3.5X12MM (Neuro Prosthesis/Implant) ×12 IMPLANT
IMPLANT QUARTEX 3.5X12MM (Neuro Prosthesis/Implant) ×36 IMPLANT
KIT BASIN OR (CUSTOM PROCEDURE TRAY) ×3 IMPLANT
KIT INFUSE MEDIUM (Orthopedic Implant) ×3 IMPLANT
KIT ROOM TURNOVER OR (KITS) ×3 IMPLANT
LOCKING CAP (Cap) ×33 IMPLANT
NEEDLE HYPO 18GX1.5 BLUNT FILL (NEEDLE) IMPLANT
NEEDLE HYPO 25X1 1.5 SAFETY (NEEDLE) ×3 IMPLANT
NS IRRIG 1000ML POUR BTL (IV SOLUTION) ×3 IMPLANT
PACK FOAM VITOSS 10CC (Orthopedic Implant) ×3 IMPLANT
PACK LAMINECTOMY NEURO (CUSTOM PROCEDURE TRAY) ×3 IMPLANT
PAD ARMBOARD 7.5X6 YLW CONV (MISCELLANEOUS) ×9 IMPLANT
PATTIES SURGICAL .25X.25 (GAUZE/BANDAGES/DRESSINGS) IMPLANT
PIN MAYFIELD SKULL DISP (PIN) ×3 IMPLANT
ROD SPINE CVD 3.5X60MM (Rod) ×6 IMPLANT
RUBBERBAND STERILE (MISCELLANEOUS) IMPLANT
SPONGE LAP 4X18 X RAY DECT (DISPOSABLE) IMPLANT
STAPLER SKIN PROX WIDE 3.9 (STAPLE) IMPLANT
STRIP CLOSURE SKIN 1/2X4 (GAUZE/BANDAGES/DRESSINGS) IMPLANT
SUT ETHILON 3 0 FSL (SUTURE) IMPLANT
SUT ETHILON 4 0 PS 2 18 (SUTURE) IMPLANT
SUT VIC AB 0 CT1 18XCR BRD 8 (SUTURE) IMPLANT
SUT VIC AB 0 CT1 18XCR BRD8 (SUTURE) ×1 IMPLANT
SUT VIC AB 0 CT1 8-18 (SUTURE) ×2
SUT VIC AB 2-0 CP2 18 (SUTURE) ×3 IMPLANT
SUT VIC AB 3-0 SH 8-18 (SUTURE) ×3 IMPLANT
SYR 3ML LL SCALE MARK (SYRINGE) IMPLANT
TOWEL OR 17X24 6PK STRL BLUE (TOWEL DISPOSABLE) ×3 IMPLANT
TOWEL OR 17X26 10 PK STRL BLUE (TOWEL DISPOSABLE) ×3 IMPLANT
UNDERPAD 30X30 INCONTINENT (UNDERPADS AND DIAPERS) IMPLANT
WATER STERILE IRR 1000ML POUR (IV SOLUTION) ×3 IMPLANT

## 2015-04-05 NOTE — Transfer of Care (Signed)
Immediate Anesthesia Transfer of Care Note  Patient: Clarence Davis  Procedure(s) Performed: Procedure(s) with comments: Cervical three-four, Cervical four-five, Cervical five-six, Cervical six-seven Posterior cervical fusion with lateral mass fixation (N/A) - C3-4 C4-5 C5-6 C6-7 Posterior cervical fusion with lateral mass fixation  Patient Location: PACU  Anesthesia Type:General  Level of Consciousness: awake, alert , oriented and patient cooperative  Airway & Oxygen Therapy: Patient Spontanous Breathing and Patient connected to nasal cannula oxygen  Post-op Assessment: Report given to RN, Post -op Vital signs reviewed and stable and Patient moving all extremities  Post vital signs: Reviewed and stable  Last Vitals:  Filed Vitals:   04/05/15 1201  BP: 122/78  Pulse: 95  Temp: 37 C  Resp: 20    Complications: No apparent anesthesia complications

## 2015-04-05 NOTE — Anesthesia Postprocedure Evaluation (Signed)
Anesthesia Post Note  Patient: Clarence Davis  Procedure(s) Performed: Procedure(s) (LRB): Cervical three-four, Cervical four-five, Cervical five-six, Cervical six-seven Posterior cervical fusion with lateral mass fixation (N/A)  Patient location during evaluation: PACU Anesthesia Type: General Level of consciousness: awake and alert Pain management: pain level controlled Vital Signs Assessment: post-procedure vital signs reviewed and stable Respiratory status: spontaneous breathing Cardiovascular status: blood pressure returned to baseline Anesthetic complications: no    Last Vitals:  Filed Vitals:   04/05/15 2050 04/05/15 2112  BP: 140/98 153/95  Pulse: 93 96  Temp:  36.4 C  Resp: 55 20    Last Pain:  Filed Vitals:   04/05/15 2135  PainSc: Asleep                 Tiajuana Amass

## 2015-04-05 NOTE — Progress Notes (Signed)
Pt new admit from PACU s/p cervical fusion with neck collar brace, alert and oriented, neck dressing dry and intact, with full dose of morphine PCA, foley cath and left wrist PIV, no s/s of distress/pain noted will continue to monitor.

## 2015-04-05 NOTE — Anesthesia Preprocedure Evaluation (Addendum)
Anesthesia Evaluation  Patient identified by MRN, date of birth, ID band Patient awake    Reviewed: Allergy & Precautions, NPO status , Patient's Chart, lab work & pertinent test results  Airway Mallampati: II  TM Distance: >3 FB Neck ROM: Full    Dental no notable dental hx. (+) Teeth Intact   Pulmonary former smoker,    Pulmonary exam normal breath sounds clear to auscultation       Cardiovascular negative cardio ROS Normal cardiovascular exam Rhythm:Regular Rate:Normal     Neuro/Psych  Headaches, PSYCHIATRIC DISORDERS Anxiety Depression  Neuromuscular disease    GI/Hepatic GERD  Medicated and Controlled,(+) Hepatitis -, C  Endo/Other  negative endocrine ROS  Renal/GU negative Renal ROS  negative genitourinary   Musculoskeletal  (+) Arthritis , Cervical radiculopathy   Abdominal   Peds  Hematology negative hematology ROS (+)   Anesthesia Other Findings   Reproductive/Obstetrics                           Anesthesia Physical Anesthesia Plan  ASA: II  Anesthesia Plan: General   Post-op Pain Management:    Induction: Intravenous  Airway Management Planned: Oral ETT  Additional Equipment:   Intra-op Plan:   Post-operative Plan: Extubation in OR  Informed Consent: I have reviewed the patients History and Physical, chart, labs and discussed the procedure including the risks, benefits and alternatives for the proposed anesthesia with the patient or authorized representative who has indicated his/her understanding and acceptance.   Dental advisory given  Plan Discussed with: CRNA, Anesthesiologist and Surgeon  Anesthesia Plan Comments:         Anesthesia Quick Evaluation

## 2015-04-05 NOTE — Anesthesia Procedure Notes (Signed)
Procedure Name: Intubation Date/Time: 04/05/2015 3:52 PM Performed by: Clearnce Sorrel Pre-anesthesia Checklist: Patient identified, Timeout performed, Emergency Drugs available, Suction available and Patient being monitored Patient Re-evaluated:Patient Re-evaluated prior to inductionOxygen Delivery Method: Circle system utilized Preoxygenation: Pre-oxygenation with 100% oxygen Intubation Type: IV induction Ventilation: Mask ventilation with difficulty and Oral airway inserted - appropriate to patient size Laryngoscope Size: Mac and 4 Grade View: Grade I Tube type: Oral Tube size: 7.5 mm Number of attempts: 1 Airway Equipment and Method: Stylet Placement Confirmation: ETT inserted through vocal cords under direct vision,  breath sounds checked- equal and bilateral and positive ETCO2 Secured at: 22 cm Tube secured with: Tape Dental Injury: Teeth and Oropharynx as per pre-operative assessment

## 2015-04-05 NOTE — H&P (Signed)
Clarence Davis is an 54 y.o. male.   Chief Complaint: neck pain HPI: patient who underwent anterior cervical fusion from c3 to c7. Did well but later he developed neck pain with movement.radiological evaluation showed pdeusoarthrosis. We decided about a posterior fusion but he was to be discontinue his smoking habit which he did.   Past Medical History  Diagnosis Date  . GERD (gastroesophageal reflux disease)     takes Omeprazole daily  . Depression     takes Citalopram daily  . History of bronchitis     many yrs ago  . History of migraine     last one on 12/15/12  . Head injury     early 22's  . Arthritis   . Chronic back pain   . Dry skin   . Hepatitis     C  . Alcoholic ([place])     quit drinking many yrs ago per pt  . Dysrhythmia     PVC's   . Chronic diarrhea   . Anxiety   . Headache(784.0)   . Bilateral carpal tunnel syndrome     Past Surgical History  Procedure Laterality Date  . Left arm surgery as a child    . Left clavicle surgery    . Hernia repair      2 umbilical;left inguinal   . Colonscopy    . Esophagogastroduodenoscopy    . Anterior cervical decomp/discectomy fusion N/A 12/15/2013    Procedure: CERVICAL FOUR TO FIVE, CERVICAL FIVE TO SIX, CERVICAL SIX TO SEVEN CERVICAL DECOMPRESSION/DISCECTOMY FUSION 3 LEVELS;  Surgeon: Floyce Stakes, MD;  Location: [facility];  Service: Neurosurgery;  Laterality: N/A;  C4-5 C5-6 C6-7 Anterior cervical decompression/diskectomy/fusion  . Back surgery    . Wisdom tooth extraction      Family History  Problem Relation Age of Onset  . Heart disease Sister    Social History:  reports that he quit smoking about 5 weeks ago. His smoking use included E-cigarettes. He has a 20 pack-year smoking history. He has never used smokeless tobacco. He reports that he drinks alcohol. He reports that he does not use illicit drugs.  Allergies:  Allergies  Allergen Reactions  . Tramadol Rash and Other (See Comments)    dizzy     Medications Prior to Admission  Medication Sig Dispense Refill  . citalopram (CELEXA) 20 MG tablet Take 30 mg by mouth daily.  3  . cyclobenzaprine (FLEXERIL) 10 MG tablet Take 10 mg by mouth 3 (three) times daily as needed for muscle spasms.   3  . meloxicam (MOBIC) 15 MG tablet Take 15 mg by mouth daily.  3  . Multiple Vitamin (MULTIVITAMIN WITH MINERALS) TABS tablet Take 1 tablet by mouth daily.    Marland Kitchen omeprazole (PRILOSEC) 40 MG capsule Take 40 mg by mouth daily.  6  . Oxycodone HCl 10 MG TABS Take 10 mg by mouth every 6 (six) hours as needed (severe pain).   0    No results found for this or any previous visit (from the past 48 hour(s)). No results found.  Review of Systems  Constitutional: Negative.   Eyes: Negative.   Respiratory: Positive for wheezing.   Cardiovascular: Negative.   Gastrointestinal: Negative.   Genitourinary: Negative.   Musculoskeletal: Positive for back pain and neck pain.  Skin: Negative.   Neurological: Positive for sensory change, speech change and headaches.  Endo/Heme/Allergies: Negative.   Psychiatric/Behavioral: Negative.     Blood pressure 122/78, pulse 95, temperature 98.6  F (37 C), temperature source Oral, resp. rate 20, height 5\' 11"  (1.803 m), weight 81.194 kg (179 lb), SpO2 97 %. Physical Exam  Hent,nl. Neck, anterios scar. Pain with mobility. Cv, nl. Lungs some wheezing. Abdomen, nl. Extremities, nl. NEURO  Some weakness of deltoids. Sensory nl. Dtr, nl. Assessment/Plan Posterior foraminotomies from c3 to ct with lateral mass fusion he is fully aware of risks and benefits  Riannon Mukherjee M 04/05/2015, 3:10 PM

## 2015-04-05 NOTE — Op Note (Signed)
NAMEANIRUDH, GIVINS              ACCOUNT NO.:  [id number]  MEDICAL RECORD NO.:  [id number]  LOCATION:  [id number]                        FACILITY:  [facility]  PHYSICIAN:  Leeroy Cha, M.D.   DATE OF BIRTH:  [birth year]  DATE OF PROCEDURE:  04/05/2015 DATE OF DISCHARGE:                              OPERATIVE REPORT   PREOPERATIVE DIAGNOSES:  Cervical pseudoarthrosis at C4-5, C5-6, and C6- 7, with a C3-C4 spondylolisthesis.  Status post fusion anterior from C4- C7.  POSTOPERATIVE DIAGNOSES:  Cervical pseudoarthrosis at C4-5, C5-6, and C6- 7, with a C3-C4 spondylolisthesis.  Status post fusion anterior from C4- C7.  PROCEDURE:  Cervical 3, 4, 5, 6, 7 posterior lateral mass screws fixation.  Foraminotomies C3-4, C4-5, C5-6, C6-7, posterolateral arthrodesis with BMP VITOSS.  C-arm.  SURGEON:  Leeroy Cha, M.D.  ASSISTANT:  Dr. Valerie Roys.  CLINICAL HISTORY:  Mr. Trimper is a gentleman, who was admitted because of neck pain.  The patient with mobility.  He has some numbness in the hand, which is moderately secondary to the carpal tunnel syndrome.  He also has a history of back pain with stenosis.  X-rays show severe stenosis at the level of C4-5, C5-6, and C6-7, but with spondylolisthesis at the level of C3-4.  Surgery was advised.  The patient knew the risk and benefits.  DESCRIPTION OF PROCEDURE:  The patient was taken to the OR.  After intubation, 3 pins were applied to the head.  He was positioned in a prone manner.  The neck and the upper part of the posterior thorax was prepped with dura prep.  Drapes were applied.  The midline incision from C2-C7-T1 was made, with retraction that laterally of the muscle.  We identified visually the spinous process of C2, all the way down to C7- T1.  Retraction was done all the way laterally until we were able to see the lateral aspect of the facet joints.  We tested the area and indeed at least posteriorly there was no movement between  C4-5, C5-6, C6-7, but there was quite a bit of movement between C3-4.  Then, we proceeded with using the C-arm to localize the lateral facet.  We tried 3 different retractor including radiopaque retractor, but we were only able to see the upper part of C3-4, from down below, it was quite difficult.  From then on, using the [place], we made holes in the lateral facet all the way down to the pedicles at the level of C3, C4, C5, C6, and C7.  This procedure was done bilaterally.  Every single hole was probe to be sure that we were surrounded by bones in all 4 quadrants, which we did.  We did this, we tried to do an AP view to see that again was quite difficult.  Nevertheless, we proceeded with insertion of the lateral mass screws at the level of C3, C4, C5, C6, and C7.  It was 4.0 x 12. Good fixation was achieved.  The only problem was at the level of C7 in the right side, which the area was quite a bit osteoporotic and we decided not to use any lateral mass screw at this level.  Having done this,  we proceeded with putting a rod in the left side from C3-C7 and the right side from C3-C6.  Then, we removed the periosteum of the lamina well at the facet medially and laterally.  Once this was achieved, we used a mix of the VITOSS and BMP for fusion.  Then, with the one 2 mm pressure point, we opened the foramen at the level of C3-4, C4-5, C5-6, and C6-7 bilaterally.  Having good hemostasis, the area was irrigated.  Drain was placed and the wound was closed with different layer of 0 Vicryl and staples.  The patient woke up, awake moving all 4 extremities.          ______________________________ Leeroy Cha, M.D.     EB/MEDQ  D:  04/05/2015  T:  04/05/2015  Job:  [id number]

## 2015-04-06 MED ORDER — SODIUM CHLORIDE 0.9 % IJ SOLN: 9.0000 mL | INTRAMUSCULAR | Status: DC | PRN

## 2015-04-06 MED ORDER — NALOXONE HCL 0.4 MG/ML IJ SOLN: 0.4000 mg | INTRAMUSCULAR | Status: DC | PRN

## 2015-04-06 MED ORDER — ONDANSETRON HCL 4 MG/2ML IJ SOLN: 4.0000 mg | Freq: Four times a day (QID) | INTRAMUSCULAR | Status: DC | PRN

## 2015-04-06 MED ORDER — KETOROLAC TROMETHAMINE 30 MG/ML IJ SOLN
30.0000 mg | Freq: Four times a day (QID) | INTRAMUSCULAR | Status: DC
  Administered 2015-04-06 – 2015-04-10 (×16): 30 mg via INTRAVENOUS
  Filled 2015-04-06 (×16): qty 1

## 2015-04-06 MED ORDER — DIPHENHYDRAMINE HCL 50 MG/ML IJ SOLN
12.5000 mg | Freq: Four times a day (QID) | INTRAMUSCULAR | Status: DC | PRN
  Administered 2015-04-06: 12.5 mg via INTRAVENOUS
  Filled 2015-04-06: qty 1

## 2015-04-06 MED ORDER — DIPHENHYDRAMINE HCL 12.5 MG/5ML PO ELIX: 12.5000 mg | ORAL_SOLUTION | Freq: Four times a day (QID) | ORAL | Status: DC | PRN

## 2015-04-06 MED ORDER — HYDROMORPHONE 1 MG/ML IV SOLN
INTRAVENOUS | Status: DC
  Administered 2015-04-06: 1 mg via INTRAVENOUS
  Administered 2015-04-06: 2.6 mg via INTRAVENOUS
  Administered 2015-04-07: 3.6 mg via INTRAVENOUS
  Administered 2015-04-07: 4.2 mg via INTRAVENOUS
  Filled 2015-04-06: qty 25

## 2015-04-06 NOTE — Clinical Social Work Note (Signed)
CSW received referral for SNF.  PT is not recommending any follow up, plan is to discharge home.  CSW to sign off please re-consult if social work needs arise.  Jones Broom. [place], MSW, [place]

## 2015-04-06 NOTE — Progress Notes (Signed)
Patient ID: Clarence Davis, male   DOB: February 01, 1961, 54 y.o.   MRN: [id number] Doing well, c/o incisional pain, no weakness. Tingling in fingers secondary to cts. hemovac came out

## 2015-04-06 NOTE — Progress Notes (Signed)
Physical Therapy Evaluation/ Discharge Patient Details Name: Clarence Davis MRN: [id number] DOB: March 10, 1961 Today's Date: 04/06/2015   History of Present Illness  54 yo admitted for posterior cervical fusion C3-7. PMHx: ACDF 2015  Clinical Impression  Pt moving well, very pleasant and aware of precautions from prior surgery. Pt received handout on cervical precautions with all education completed and mobility at baseline. Pt reports neck and low back pain but no other deficits of strength or sensation. Pt encouraged to walk throughout the day acutely and pt able to verbalize precautions. No further needs at this time with pt aware and agreeable. Will sign off.     Follow Up Recommendations No PT follow up    Equipment Recommendations  None recommended by PT    Recommendations for Other Services       Precautions / Restrictions Precautions Precautions: Cervical Required Braces or Orthoses: Cervical Brace Cervical Brace: Hard collar;At all times      Mobility  Bed Mobility Overal bed mobility: Modified Independent                Transfers Overall transfer level: Independent                  Ambulation/Gait Ambulation/Gait assistance: Independent Ambulation Distance (Feet): 500 Feet Assistive device: None Gait Pattern/deviations: WFL(Within Functional Limits)   Gait velocity interpretation: at or above normal speed for age/gender    Stairs Stairs: Yes Stairs assistance: Independent Stair Management: Alternating pattern;Forwards;No rails Number of Stairs: 4    Wheelchair Mobility    Modified Rankin (Stroke Patients Only)       Balance                                             Pertinent Vitals/Pain Pain Assessment: 0-10 Pain Score: 10-Worst pain ever Pain Location: neck Pain Descriptors / Indicators: Aching;Throbbing Pain Intervention(s): PCA encouraged;Monitored during session;Limited activity within patient's  tolerance;Repositioned;Patient requesting pain meds-RN notified;Premedicated before session    [place] expects to be discharged to:: Private residence Living Arrangements: Spouse/significant other Available Help at Discharge: Family;Available 24 hours/day Type of Home: House Home Access: Stairs to enter   [employer] of Steps: 4 Home Layout: One level Home Equipment: Cane - single point Additional Comments: pt states tip is worn off of cane    Prior Function Level of Independence: Independent               Hand Dominance        Extremity/Trunk Assessment   Upper Extremity Assessment: Overall WFL for tasks assessed           Lower Extremity Assessment: Overall WFL for tasks assessed      Cervical / Trunk Assessment: Normal  Communication   Communication: No difficulties  Cognition Arousal/Alertness: Awake/alert Behavior During Therapy: WFL for tasks assessed/performed Overall Cognitive Status: Within Functional Limits for tasks assessed                      General Comments      Exercises        Assessment/Plan    PT Assessment [place] does not need any further PT services  PT Diagnosis Acute pain   PT Problem List    PT Treatment Interventions     PT Goals (Current goals can be found in the Care Plan section) Acute Rehab PT  Goals PT Goal Formulation: All assessment and education complete, DC therapy    Frequency     Barriers to discharge        Co-evaluation               End of Session Equipment Utilized During Treatment: Cervical collar Activity Tolerance: Patient tolerated treatment well Patient left: in chair;with call bell/phone within reach;with nursing/sitter in room Nurse Communication: Mobility status;Precautions         Time: [id number] PT Time Calculation (min) (ACUTE ONLY): 18 min   Charges:   PT Evaluation $Initial PT Evaluation Tier I: 1 Procedure     PT G CodesMelford Aase 04/06/2015, 10:10 AM Elwyn Reach, [place]

## 2015-04-06 NOTE — Care Management Note (Signed)
Case Management Note  Patient Details  Name: Clarence Davis MRN: [id number] Date of Birth: 08/13/1960  Subjective/Objective:   54 y.o. M admitted 04/05/2015 for posterior Cervical Fusion C3-7. PT evaluation recommending no PT follow-up.                  Action/Plan: Anticipate discharge home 04/07/2015. No further CM needs but will be available should additional discharge needs arise.   Expected Discharge Date:                  Expected Discharge Plan:  Home/Self Care  In-House Referral:     Discharge planning Services  CM Consult  Post Acute Care Choice:    Choice offered to:     DME Arranged:    DME Agency:     HH Arranged:    HH Agency:     Status of Service:  In process, will continue to follow  Medicare Important Message Given:    Date Medicare IM Given:    Medicare IM give by:    Date Additional Medicare IM Given:    Additional Medicare Important Message give by:     If discussed at [place] of Stay Meetings, dates discussed:    Additional Comments:  Delrae Sawyers, RN 04/06/2015, 4:41 PM

## 2015-04-07 MED ORDER — OXYCODONE HCL 5 MG PO TABS
10.0000 mg | ORAL_TABLET | ORAL | Status: DC | PRN
  Administered 2015-04-07 – 2015-04-10 (×14): 10 mg via ORAL
  Filled 2015-04-07 (×14): qty 2

## 2015-04-07 NOTE — Progress Notes (Signed)
Subjective: Patient reports still quite sore  Objective: Vital signs in last 24 hours: Temp:  [98.6 F (37 C)-99.5 F (37.5 C)] 98.6 F (37 C) (01/01 0524) Pulse Rate:  [104-116] 115 (01/01 0524) Resp:  [12-19] 14 (01/01 0727) BP: (90-124)/(59-91) 124/69 mmHg (01/01 0524) SpO2:  [90 %-97 %] 96 % (01/01 0727)  Intake/Output from previous day: 12/31 0701 - 01/01 0700 In: 3290.8 [P.O.:924; I.V.:2366.8] Out: 1500 [Urine:1500] Intake/Output this shift: Total I/O In: 840 [P.O.:840] Out: -   Physical Exam: Up in chair, wearing collar.  Sore in neck.  Lab Results: No results for input(s): WBC, HGB, HCT, PLT in the last 72 hours. BMET No results for input(s): NA, K, CL, CO2, GLUCOSE, BUN, CREATININE, CALCIUM in the last 72 hours.  Studies/Results: Dg Cervical Spine 1 View  04/05/2015  CLINICAL DATA:  Initial encounter for C3-C7 posterior fusion EXAM: CERVICAL SPINE 1 VIEW COMPARISON:  CT scan from 01/22/2015. FINDINGS: Cross-table lateral portable view of the cervical spine obtained at 1845 hours is labeled 1. Mid and lower cervical spine obscured by the patient's shoulders. Posterior fusion hardware overlies the C3 lateral masses. Endotracheal tube is incompletely visualized. Extensive surgical apparatus and monitoring equipment overlies the face and neck. IMPRESSION: Intraoperative localization. Electronically Signed   By: Misty Stanley M.D.   On: 04/05/2015 19:10   Dg Cervical Spine 2-3 Views  04/05/2015  CLINICAL DATA:  Cervical spinal fusion at C3-C7.  Initial encounter. EXAM: CERVICAL SPINE - 2-3 VIEW COMPARISON:  None. FINDINGS: Cervical spinal fusion hardware is noted. This is not well characterized on the lateral view due to limitations in positioning. The osseous structures are difficult to fully assess. IMPRESSION: Cervical spinal fusion hardware noted. Electronically Signed   By: Garald Balding M.D.   On: 04/05/2015 21:27    Assessment/Plan: D/C PCA.  Change to po pain  meds.    LOS: 2 days    Peggyann Shoals, MD 04/07/2015, 11:34 AM

## 2015-04-08 NOTE — Care Management Note (Signed)
Case Management Note  Patient Details  Name: Clarence Davis MRN: [id number] Date of Birth: 1960/06/16  Subjective/Objective:                    Action/Plan:  PT : no PT follow up no DME. Initial UR completed  Expected Discharge Date:                  Expected Discharge Plan:  Home/Self Care  In-House Referral:     Discharge planning Services  CM Consult  Post Acute Care Choice:    Choice offered to:     DME Arranged:    DME Agency:     HH Arranged:    HH Agency:     Status of Service:  In process, will continue to follow  Medicare Important Message Given:    Date Medicare IM Given:    Medicare IM give by:    Date Additional Medicare IM Given:    Additional Medicare Important Message give by:     If discussed at [place] of Stay Meetings, dates discussed:    Additional Comments:  Marilu Favre, RN 04/08/2015, 11:07 AM

## 2015-04-08 NOTE — Progress Notes (Signed)
Subjective: Patient reports "I've been bleeding all night"  Objective: Vital signs in last 24 hours: Temp:  [98.4 F (36.9 C)-99.4 F (37.4 C)] 98.6 F (37 C) (01/02 0646) Pulse Rate:  [105-116] 105 (01/02 0646) Resp:  [15-16] 16 (01/02 0646) BP: (117-122)/(60-66) 122/66 mmHg (01/02 0646) SpO2:  [92 %-96 %] 96 % (01/02 0646)  Intake/Output from previous day: 01/01 0701 - 01/02 0700 In: 3363 [P.O.:2740; I.V.:623] Out: 1 [Urine:1] Intake/Output this shift:    Physical Exam: Dressing recently changed, now dry.  No upper extremity complaints.  Lab Results: No results for input(s): WBC, HGB, HCT, PLT in the last 72 hours. BMET No results for input(s): NA, K, CL, CO2, GLUCOSE, BUN, CREATININE, CALCIUM in the last 72 hours.  Studies/Results: No results found.  Assessment/Plan: Patient concerned about wound drainage.  Will hold off on discharge and if doing well, D/C in AM, per Dr. Joya Salm.    LOS: 3 days    Peggyann Shoals, MD 04/08/2015, 12:32 PM

## 2015-04-08 NOTE — Progress Notes (Signed)
Patient ID: Clarence Davis, male   DOB: 1960/09/10, 55 y.o.   MRN: [id number] Came by to see hi. Not in his room or in the floor. Left a mesagge

## 2015-04-08 NOTE — Progress Notes (Signed)
OT Cancellation Note  Patient Details Name: Damyan Cost MRN: [id number] DOB: 04-29-1960   Cancelled Treatment:    Reason Eval/Treat Not Completed: OT screened, no needs identified, will sign off. Pt able to recall cervical precautions from prior surgery. Reports he will have 24/7 assist upon return home. No acute OT needs identified; signing off at this time. Thank you for this referral.   Binnie Kand M.S., OTR/L Pager: [phone]  04/08/2015, 10:24 AM

## 2015-04-09 ENCOUNTER — Encounter (HOSPITAL_COMMUNITY): Payer: Self-pay | Admitting: Neurosurgery

## 2015-04-09 NOTE — Progress Notes (Signed)
10mg oxy ir administered for c/o pain 

## 2015-04-09 NOTE — Progress Notes (Signed)
Patient ID: Clarence Davis, male   DOB: 08/05/1960, 55 y.o.   MRN: [id number] Still having incisional pain,. Discharge in am

## 2015-04-09 NOTE — Progress Notes (Signed)
2 po percocet administered for c/o pain

## 2015-04-09 NOTE — Progress Notes (Signed)
10mg po oxy ir administered for c/o pain 

## 2015-04-10 NOTE — Discharge Summary (Signed)
Physician Discharge Summary  Patient ID: Clarence Davis MRN: [id number] DOB/AGE: 04/28/1960 55 y.o.  Admit date: 04/05/2015 Discharge date: 04/10/2015  Admission Diagnoses:cervical pseudoarthrosis  Discharge Diagnoses:  Active Problems:   Cervical pseudoarthrosis [facility])   Discharged Condition: ambulating  Hospital Course: surgery  Consults: none  Significant Diagnostic Studies: myelo  Treatments: posterior cervical fusion  Discharge Exam:no weakness  Disposition: home     Medication List    ASK your doctor about these medications        citalopram 20 MG tablet  Commonly known as:  CELEXA  Take 30 mg by mouth daily.     cyclobenzaprine 10 MG tablet  Commonly known as:  FLEXERIL  Take 10 mg by mouth 3 (three) times daily as needed for muscle spasms.     meloxicam 15 MG tablet  Commonly known as:  MOBIC  Take 15 mg by mouth daily.     multivitamin with minerals Tabs tablet  Take 1 tablet by mouth daily.     omeprazole 40 MG capsule  Commonly known as:  PRILOSEC  Take 40 mg by mouth daily.     Oxycodone HCl 10 MG Tabs  Take 10 mg by mouth every 6 (six) hours as needed (severe pain).         Signed: Floyce Stakes 04/10/2015, 11:53 AM

## 2015-04-10 NOTE — Discharge Summary (Signed)
Physician Discharge Summary  Patient ID: Clarence Davis MRN: [id number] DOB/AGE: [birth year] 55 y.o.  Admit date: 04/05/2015 Discharge date: 04/10/2015  Admission Diagnoses:cervical pseudoarthrosis  Discharge Diagnoses:  Active Problems:   Cervical pseudoarthrosis ([place])   Discharged Condition:incisional pain  Hospital Course: surgery  Consults: none  Significant Diagnostic Studies:mtlo  Treatments: cervical posterior fusion  Discharge Exam: Blood pressure 128/95, pulse 106, temperature 98.2 F (36.8 C), temperature source Oral, resp. rate 17, height 5\' 10"  (1.778 m), weight 85.231 kg (187 lb 14.4 oz), SpO2 96 %. No weakness except for CTS  Disposition: home     Medication List    ASK your doctor about these medications        citalopram 20 MG tablet  Commonly known as:  CELEXA  Take 30 mg by mouth daily.     cyclobenzaprine 10 MG tablet  Commonly known as:  FLEXERIL  Take 10 mg by mouth 3 (three) times daily as needed for muscle spasms.     meloxicam 15 MG tablet  Commonly known as:  MOBIC  Take 15 mg by mouth daily.     multivitamin with minerals Tabs tablet  Take 1 tablet by mouth daily.     omeprazole 40 MG capsule  Commonly known as:  PRILOSEC  Take 40 mg by mouth daily.     Oxycodone HCl 10 MG Tabs  Take 10 mg by mouth every 6 (six) hours as needed (severe pain).         Signed: Floyce Stakes 04/10/2015, 11:50 AM

## 2015-04-10 NOTE — Progress Notes (Signed)
Discussed discharge summary with patient. Reviewed all medications with patient. Patient ready for discharge.  

## 2016-01-10 ENCOUNTER — Other Ambulatory Visit: Payer: Self-pay | Admitting: Neurosurgery

## 2016-01-10 DIAGNOSIS — M5416 Radiculopathy, lumbar region: Secondary | ICD-10-CM

## 2016-01-28 ENCOUNTER — Ambulatory Visit
Admission: RE | Admit: 2016-01-28 | Discharge: 2016-01-28 | Disposition: A | Discharge status: Home / Self Care | Payer: Medicare Other | Source: Ambulatory Visit | Attending: Neurosurgery | Admitting: Neurosurgery

## 2016-01-28 DIAGNOSIS — M5416 Radiculopathy, lumbar region: Secondary | ICD-10-CM

## 2016-01-28 MED ORDER — MEPERIDINE HCL 100 MG/ML IJ SOLN: 100.0000 mg | Freq: Once | INTRAMUSCULAR | Status: DC

## 2016-01-28 MED ORDER — IOPAMIDOL (ISOVUE-M 200) INJECTION 41%
15.0000 mL | Freq: Once | INTRAMUSCULAR | Status: AC
  Administered 2016-01-28: 15 mL via INTRATHECAL

## 2016-01-28 MED ORDER — ONDANSETRON HCL 4 MG/2ML IJ SOLN: 4.0000 mg | Freq: Once | INTRAMUSCULAR | Status: DC

## 2016-01-28 MED ORDER — DIAZEPAM 5 MG PO TABS
10.0000 mg | ORAL_TABLET | Freq: Once | ORAL | Status: AC
  Administered 2016-01-28: 10 mg via ORAL

## 2016-01-28 NOTE — Discharge Instructions (Signed)
Myelogram Discharge Instructions  1. Go home and rest quietly for the next 24 hours.  It is important to lie flat for the next 24 hours.  Get up only to go to the restroom.  You may lie in the bed or on a couch on your back, your stomach, your left side or your right side.  You may have one pillow under your head.  You may have pillows between your knees while you are on your side or under your knees while you are on your back.  2. DO NOT drive today.  Recline the seat as far back as it will go, while still wearing your seat belt, on the way home.  3. You may get up to go to the bathroom as needed.  You may sit up for 10 minutes to eat.  You may resume your normal diet and medications unless otherwise indicated.  Drink lots of extra fluids today and tomorrow.  4. The incidence of headache, nausea, or vomiting is about 5% (one in 20 patients).  If you develop a headache, lie flat and drink plenty of fluids until the headache goes away.  Caffeinated beverages may be helpful.  If you develop severe nausea and vomiting or a headache that does not go away with flat bed rest, call [phone].  5. You may resume normal activities after your 24 hours of bed rest is over; however, do not exert yourself strongly or do any heavy lifting tomorrow. If when you get up you have a headache when standing, go back to bed and force fluids for another 24 hours.  6. Call your physician for a follow-up appointment.  The results of your myelogram will be sent directly to your physician by the following day.  7. If you have any questions or if complications develop after you arrive home, please call [phone].  Discharge instructions have been explained to the patient.  The patient, or the person responsible for the patient, fully understands these instructions.       [place], AFTER 1:00 PM.

## 2016-01-30 ENCOUNTER — Telehealth: Payer: Self-pay

## 2016-01-30 NOTE — Telephone Encounter (Signed)
Spoke with patient after his myelogram here 01/28/16.  He says he is doing just fine and denies any headache.  jkl

## 2016-02-03 ENCOUNTER — Other Ambulatory Visit: Payer: Self-pay | Admitting: Neurosurgery

## 2016-02-19 ENCOUNTER — Encounter (HOSPITAL_COMMUNITY): Payer: Self-pay

## 2016-02-19 ENCOUNTER — Encounter (HOSPITAL_COMMUNITY)
Admission: RE | Admit: 2016-02-19 | Discharge: 2016-02-19 | Disposition: A | Discharge status: Home / Self Care | Payer: Medicare Other | Source: Ambulatory Visit | Attending: Neurosurgery | Admitting: Neurosurgery

## 2016-02-19 ENCOUNTER — Other Ambulatory Visit: Payer: Self-pay

## 2016-02-19 DIAGNOSIS — M4802 Spinal stenosis, cervical region: Secondary | ICD-10-CM | POA: Insufficient documentation

## 2016-02-19 DIAGNOSIS — M1990 Unspecified osteoarthritis, unspecified site: Secondary | ICD-10-CM | POA: Diagnosis not present

## 2016-02-19 DIAGNOSIS — M549 Dorsalgia, unspecified: Secondary | ICD-10-CM | POA: Diagnosis not present

## 2016-02-19 DIAGNOSIS — Z01812 Encounter for preprocedural laboratory examination: Secondary | ICD-10-CM | POA: Diagnosis not present

## 2016-02-19 DIAGNOSIS — F419 Anxiety disorder, unspecified: Secondary | ICD-10-CM | POA: Diagnosis not present

## 2016-02-19 DIAGNOSIS — F329 Major depressive disorder, single episode, unspecified: Secondary | ICD-10-CM | POA: Insufficient documentation

## 2016-02-19 DIAGNOSIS — G5603 Carpal tunnel syndrome, bilateral upper limbs: Secondary | ICD-10-CM | POA: Insufficient documentation

## 2016-02-19 DIAGNOSIS — G8929 Other chronic pain: Secondary | ICD-10-CM | POA: Diagnosis not present

## 2016-02-19 DIAGNOSIS — S129XXA Fracture of neck, unspecified, initial encounter: Secondary | ICD-10-CM | POA: Diagnosis not present

## 2016-02-19 DIAGNOSIS — B1920 Unspecified viral hepatitis C without hepatic coma: Secondary | ICD-10-CM | POA: Insufficient documentation

## 2016-02-19 DIAGNOSIS — K219 Gastro-esophageal reflux disease without esophagitis: Secondary | ICD-10-CM | POA: Diagnosis not present

## 2016-02-19 LAB — CBC
HEMATOCRIT: 45 % (ref 39.0–52.0)
HEMOGLOBIN: 15.6 g/dL (ref 13.0–17.0)
MCH: 32.8 pg (ref 26.0–34.0)
MCHC: 34.7 g/dL (ref 30.0–36.0)
MCV: 94.5 fL (ref 78.0–100.0)
Platelets: 148 10*3/uL — ABNORMAL LOW (ref 150–400)
RBC: 4.76 MIL/uL (ref 4.22–5.81)
RDW: 13.1 % (ref 11.5–15.5)
WBC: 4.7 10*3/uL (ref 4.0–10.5)

## 2016-02-19 LAB — SURGICAL PCR SCREEN
MRSA, PCR: NEGATIVE
STAPHYLOCOCCUS AUREUS: NEGATIVE

## 2016-02-19 LAB — COMPREHENSIVE METABOLIC PANEL
ALBUMIN: 4.2 g/dL (ref 3.5–5.0)
ALK PHOS: 65 U/L (ref 38–126)
ALT: 16 U/L — ABNORMAL LOW (ref 17–63)
ANION GAP: 8 (ref 5–15)
AST: 19 U/L (ref 15–41)
BILIRUBIN TOTAL: 0.7 mg/dL (ref 0.3–1.2)
BUN: 9 mg/dL (ref 6–20)
CO2: 29 mmol/L (ref 22–32)
Calcium: 9.4 mg/dL (ref 8.9–10.3)
Chloride: 100 mmol/L — ABNORMAL LOW (ref 101–111)
Creatinine, Ser: 1.07 mg/dL (ref 0.61–1.24)
GFR calc Af Amer: >60 mL/min (ref >60)
GFR calc non Af Amer: >60 mL/min (ref >60)
GLUCOSE: 97 mg/dL (ref 65–99)
POTASSIUM: 4 mmol/L (ref 3.5–5.1)
SODIUM: 137 mmol/L (ref 135–145)
TOTAL PROTEIN: 6.9 g/dL (ref 6.5–8.1)

## 2016-02-19 LAB — TYPE AND SCREEN
ABO/RH(D): A POS
Antibody Screen: NEGATIVE

## 2016-02-19 NOTE — Progress Notes (Signed)
   02/19/16 1259  OBSTRUCTIVE SLEEP APNEA  Have you ever been diagnosed with sleep apnea through a sleep study? No  Do you snore loudly (loud enough to be heard through closed doors)?  1  Do you often feel tired, fatigued, or sleepy during the daytime (such as falling asleep during driving or talking to someone)? 0  Has anyone observed you stop breathing during your sleep? 1  Do you have, or are you being treated for high blood pressure? 0  BMI more than 35 kg/m2? 0  Age > 50 (1-yes) 1  Neck circumference greater than:Male 16 inches or larger, Male 17inches or larger? 1  Male Gender (Yes=1) 1  Obstructive Sleep Apnea Score 5  Score 5 or greater  Results sent to PCP

## 2016-02-19 NOTE — Progress Notes (Signed)
PCP - Lawerence Marya Fossa Cardiologist - denies  Chest x-ray - not needed EKG - 02/19/16 Stress Test - about 6 years ago in [place], requesting ECHO - 6 years ago in [place] requesting Cardiac Cath - denies  Sending to anesthesia for review of requested records and cardiac history     Patient denies shortness of breath, fever, cough and chest pain at PAT appointment

## 2016-02-19 NOTE — Pre-Procedure Instructions (Signed)
Clarence Davis  02/19/2016      [facility] - [place], [place] - [address] AT [address] [place] [address] Phone: [phone] Fax: [phone]    Your procedure is scheduled on November 21  Report to [place] at [place].M.  Call this number if you have problems the morning of surgery:  [phone]   Remember:  Do not eat food or drink liquids after midnight.   Take these medicines the morning of surgery with A SIP OF WATER cyclobenzaprine (FLEXERIL)  If needed, omeprazole (PRILOSEC), Oxycodone HCl if needed  7 days prior to surgery STOP taking any  MOBIC, Aspirin, Aleve, Naproxen, Ibuprofen, Motrin, Advil, Goody's, BC's, all herbal medications, fish oil, and all vitamins    Do not wear jewelry.  Do not wear lotions, powders, or colgone, or deoderant.  Men may shave face and neck.  Do not bring valuables to the hospital.  [facility] is not responsible for any belongings or valuables.  Contacts, dentures or bridgework may not be worn into surgery.  Leave your suitcase in the car.  After surgery it may be brought to your room.  For patients admitted to the hospital, discharge time will be determined by your treatment team.  Patients discharged the day of surgery will not be allowed to drive home.    Special instructions:   [place]- Preparing For Surgery  Before surgery, you can play an important role. Because skin is not sterile, your skin needs to be as free of germs as possible. You can reduce the number of germs on your skin by washing with CHG (chlorahexidine gluconate) Soap before surgery.  CHG is an antiseptic cleaner which kills germs and bonds with the skin to continue killing germs even after washing.  Please do not use if you have an allergy to CHG or antibacterial soaps. If your skin becomes reddened/irritated stop using the CHG.  Do not shave (including legs and underarms) for at  least 48 hours prior to first CHG shower. It is OK to shave your face.  Please follow these instructions carefully.   1. Shower the NIGHT BEFORE SURGERY and the MORNING OF SURGERY with CHG.   2. If you chose to wash your hair, wash your hair first as usual with your normal shampoo.  3. After you shampoo, rinse your hair and body thoroughly to remove the shampoo.  4. Use CHG as you would any other liquid soap. You can apply CHG directly to the skin and wash gently with a scrungie or a clean washcloth.   5. Apply the CHG Soap to your body ONLY FROM THE NECK DOWN.  Do not use on open wounds or open sores. Avoid contact with your eyes, ears, mouth and genitals (private parts). Wash genitals (private parts) with your normal soap.  6. Wash thoroughly, paying special attention to the area where your surgery will be performed.  7. Thoroughly rinse your body with warm water from the neck down.  8. DO NOT shower/wash with your normal soap after using and rinsing off the CHG Soap.  9. Pat yourself dry with a CLEAN TOWEL.   10. Wear CLEAN PAJAMAS   11. Place CLEAN SHEETS on your bed the night of your first shower and DO NOT SLEEP WITH PETS.    Day of Surgery: Do not apply any deodorants/lotions. Please wear clean clothes to the hospital/surgery center.  Please read over the following fact sheets that you were given. Pain Booklet, Coughing and Deep Breathing, MRSA Information and Surgical Site Infection Prevention

## 2016-02-20 NOTE — Progress Notes (Signed)
Anesthesia Chart Review:  Pt is a 55 year old male scheduled for L3-4 PLIF with augmentation on 02/25/2016 with Leeroy Cha, MD.   PMH includes:  Hepatitis C (treated), PVCs, hx alcohol abuse (last 2012), GERD. Current smoker. BMI 27. S/p posterior cervical fusion 04/05/15. S/p cervical decompression/discectomy fusion 12/15/13. S/p PLIF 12/23/12.   Medications include: Prilosec  EKG 02/19/16: NSR.   Holter monitor 03/21/12 (in correspondence in media tab 12/26/12): 1. NSR.  2. Occasional PVCs.  3. Occasional PACs.  4. V couplet x1   Nuclear stress test 03/18/12 (in correspondence in media tab 12/26/12): - small to moderate inferior posterior partially reversible defect which may represent varying attenuation artifact versus some degree of ischemia which cannot totally be excluded  Echo 03/16/12 (in correspondence in media tab 12/26/12): 1. EF 55-60%. Normal LV size wall thickness, with normal systolic and diastolic function. 2. Mildly dilated LA. 3. Normal aortic valve, without any evidence of aortic stenosis or insufficiency. 4. Trace mitral and tricuspid valve regurgitation.  Pt has tolerated several spinal surgeries in recent past without issue.   If no changes, I anticipate pt can proceed with surgery as scheduled.   Willeen Cass, FNP-BC [facility] Short Stay Surgical Center/Anesthesiology Phone: [phone] 02/20/2016 2:33 PM

## 2016-02-25 ENCOUNTER — Inpatient Hospital Stay (HOSPITAL_COMMUNITY)
Admission: RE | Admit: 2016-02-25 | Discharge: 2016-02-26 | DRG: 455 | Disposition: A | Discharge status: Home / Self Care | Payer: Medicare Other | Source: Ambulatory Visit | Attending: Neurosurgery | Admitting: Neurosurgery

## 2016-02-25 ENCOUNTER — Inpatient Hospital Stay (HOSPITAL_COMMUNITY): Payer: Medicare Other

## 2016-02-25 ENCOUNTER — Encounter (HOSPITAL_COMMUNITY): Payer: Self-pay | Admitting: *Deleted

## 2016-02-25 ENCOUNTER — Inpatient Hospital Stay (HOSPITAL_COMMUNITY): Payer: Medicare Other | Admitting: Certified Registered Nurse Anesthetist

## 2016-02-25 ENCOUNTER — Inpatient Hospital Stay (HOSPITAL_COMMUNITY): Payer: Medicare Other | Admitting: Emergency Medicine

## 2016-02-25 ENCOUNTER — Encounter (HOSPITAL_COMMUNITY)
Admission: RE | Disposition: A | Discharge status: Home / Self Care | Payer: Self-pay | Source: Ambulatory Visit | Attending: Neurosurgery

## 2016-02-25 DIAGNOSIS — M5116 Intervertebral disc disorders with radiculopathy, lumbar region: Secondary | ICD-10-CM | POA: Diagnosis present

## 2016-02-25 DIAGNOSIS — K219 Gastro-esophageal reflux disease without esophagitis: Secondary | ICD-10-CM | POA: Diagnosis present

## 2016-02-25 DIAGNOSIS — Z981 Arthrodesis status: Secondary | ICD-10-CM

## 2016-02-25 DIAGNOSIS — Z79899 Other long term (current) drug therapy: Secondary | ICD-10-CM | POA: Diagnosis not present

## 2016-02-25 DIAGNOSIS — M4316 Spondylolisthesis, lumbar region: Secondary | ICD-10-CM | POA: Diagnosis present

## 2016-02-25 DIAGNOSIS — M48061 Spinal stenosis, lumbar region without neurogenic claudication: Secondary | ICD-10-CM | POA: Diagnosis present

## 2016-02-25 DIAGNOSIS — M5136 Other intervertebral disc degeneration, lumbar region: Secondary | ICD-10-CM

## 2016-02-25 DIAGNOSIS — F418 Other specified anxiety disorders: Secondary | ICD-10-CM | POA: Diagnosis present

## 2016-02-25 DIAGNOSIS — F17210 Nicotine dependence, cigarettes, uncomplicated: Secondary | ICD-10-CM | POA: Diagnosis present

## 2016-02-25 DIAGNOSIS — Z419 Encounter for procedure for purposes other than remedying health state, unspecified: Secondary | ICD-10-CM

## 2016-02-25 DIAGNOSIS — R262 Difficulty in walking, not elsewhere classified: Secondary | ICD-10-CM

## 2016-02-25 DIAGNOSIS — M51369 Other intervertebral disc degeneration, lumbar region without mention of lumbar back pain or lower extremity pain: Secondary | ICD-10-CM

## 2016-02-25 HISTORY — DX: Other intervertebral disc degeneration, lumbar region without mention of lumbar back pain or lower extremity pain: M51.369

## 2016-02-25 SURGERY — POSTERIOR LUMBAR FUSION 1 LEVEL
Anesthesia: General | Site: Back

## 2016-02-25 MED ORDER — ONDANSETRON HCL 4 MG/2ML IJ SOLN: 4.0000 mg | Freq: Four times a day (QID) | INTRAMUSCULAR | Status: DC | PRN

## 2016-02-25 MED ORDER — LACTATED RINGERS IV SOLN
INTRAVENOUS | Status: DC
  Administered 2016-02-25: 11:00:00 via INTRAVENOUS

## 2016-02-25 MED ORDER — ARTIFICIAL TEARS OP OINT
TOPICAL_OINTMENT | OPHTHALMIC | Status: AC
  Filled 2016-02-25: qty 3.5

## 2016-02-25 MED ORDER — MEPERIDINE HCL 25 MG/ML IJ SOLN: 6.2500 mg | INTRAMUSCULAR | Status: DC | PRN

## 2016-02-25 MED ORDER — GELATIN ABSORBABLE MT POWD
OROMUCOSAL | Status: DC | PRN
  Administered 2016-02-25: 5 mL via TOPICAL

## 2016-02-25 MED ORDER — MORPHINE SULFATE 2 MG/ML IV SOLN
INTRAVENOUS | Status: DC
  Administered 2016-02-25: 23:00:00 via INTRAVENOUS
  Administered 2016-02-26 (×3): 2 mg via INTRAVENOUS
  Filled 2016-02-25: qty 25

## 2016-02-25 MED ORDER — CYCLOBENZAPRINE HCL 10 MG PO TABS
ORAL_TABLET | ORAL | Status: AC
  Filled 2016-02-25: qty 1

## 2016-02-25 MED ORDER — SUGAMMADEX SODIUM 200 MG/2ML IV SOLN
INTRAVENOUS | Status: DC | PRN
  Administered 2016-02-25: 200 mg via INTRAVENOUS

## 2016-02-25 MED ORDER — CHLORHEXIDINE GLUCONATE CLOTH 2 % EX PADS: 6.0000 | MEDICATED_PAD | Freq: Once | CUTANEOUS | Status: DC

## 2016-02-25 MED ORDER — BUPIVACAINE LIPOSOME 1.3 % IJ SUSP
INTRAMUSCULAR | Status: DC | PRN
  Administered 2016-02-25: 20 mL

## 2016-02-25 MED ORDER — ZOLPIDEM TARTRATE 5 MG PO TABS: 5.0000 mg | ORAL_TABLET | Freq: Every evening | ORAL | Status: DC | PRN

## 2016-02-25 MED ORDER — NALOXONE HCL 0.4 MG/ML IJ SOLN: 0.4000 mg | INTRAMUSCULAR | Status: DC | PRN

## 2016-02-25 MED ORDER — BUPIVACAINE LIPOSOME 1.3 % IJ SUSP
20.0000 mL | INTRAMUSCULAR | Status: DC
  Filled 2016-02-25: qty 20

## 2016-02-25 MED ORDER — ACETAMINOPHEN 325 MG PO TABS
650.0000 mg | ORAL_TABLET | ORAL | Status: DC | PRN
Start: 2016-02-25 — End: 2016-02-26

## 2016-02-25 MED ORDER — MIDAZOLAM HCL 5 MG/5ML IJ SOLN
INTRAMUSCULAR | Status: DC | PRN
  Administered 2016-02-25: 2 mg via INTRAVENOUS

## 2016-02-25 MED ORDER — ONDANSETRON HCL 4 MG/2ML IJ SOLN
INTRAMUSCULAR | Status: DC | PRN
  Administered 2016-02-25: 4 mg via INTRAVENOUS

## 2016-02-25 MED ORDER — THROMBIN 20000 UNITS EX SOLR
CUTANEOUS | Status: AC
  Filled 2016-02-25: qty 20000

## 2016-02-25 MED ORDER — CEFAZOLIN IN D5W 1 GM/50ML IV SOLN
1.0000 g | Freq: Three times a day (TID) | INTRAVENOUS | Status: AC
  Administered 2016-02-26 (×2): 1 g via INTRAVENOUS
  Filled 2016-02-25 (×3): qty 50

## 2016-02-25 MED ORDER — CEFAZOLIN SODIUM-DEXTROSE 2-3 GM-% IV SOLR
INTRAVENOUS | Status: DC | PRN
  Administered 2016-02-25: 2 g via INTRAVENOUS

## 2016-02-25 MED ORDER — PHENOL 1.4 % MT LIQD: 1.0000 | OROMUCOSAL | Status: DC | PRN

## 2016-02-25 MED ORDER — ROCURONIUM BROMIDE 100 MG/10ML IV SOLN
INTRAVENOUS | Status: DC | PRN
  Administered 2016-02-25: 60 mg via INTRAVENOUS
  Administered 2016-02-25 (×2): 10 mg via INTRAVENOUS

## 2016-02-25 MED ORDER — BUPIVACAINE HCL (PF) 0.5 % IJ SOLN
INTRAMUSCULAR | Status: AC
  Filled 2016-02-25: qty 30

## 2016-02-25 MED ORDER — PROPOFOL 10 MG/ML IV BOLUS
INTRAVENOUS | Status: AC
  Filled 2016-02-25: qty 20

## 2016-02-25 MED ORDER — DIPHENHYDRAMINE HCL 50 MG/ML IJ SOLN: 12.5000 mg | Freq: Four times a day (QID) | INTRAMUSCULAR | Status: DC | PRN

## 2016-02-25 MED ORDER — MIDAZOLAM HCL 2 MG/2ML IJ SOLN
INTRAMUSCULAR | Status: AC
  Filled 2016-02-25: qty 2

## 2016-02-25 MED ORDER — FENTANYL CITRATE (PF) 100 MCG/2ML IJ SOLN
INTRAMUSCULAR | Status: AC
  Administered 2016-02-25: 100 ug
  Filled 2016-02-25: qty 2

## 2016-02-25 MED ORDER — THROMBIN 5000 UNITS EX SOLR
CUTANEOUS | Status: AC
  Filled 2016-02-25: qty 5000

## 2016-02-25 MED ORDER — ONDANSETRON HCL 4 MG/2ML IJ SOLN: 4.0000 mg | INTRAMUSCULAR | Status: DC | PRN

## 2016-02-25 MED ORDER — DIPHENHYDRAMINE HCL 12.5 MG/5ML PO ELIX: 12.5000 mg | ORAL_SOLUTION | Freq: Four times a day (QID) | ORAL | Status: DC | PRN

## 2016-02-25 MED ORDER — SODIUM CHLORIDE 0.9% FLUSH: 3.0000 mL | INTRAVENOUS | Status: DC | PRN

## 2016-02-25 MED ORDER — SODIUM CHLORIDE 0.9% FLUSH
3.0000 mL | Freq: Two times a day (BID) | INTRAVENOUS | Status: DC
  Administered 2016-02-26: 3 mL via INTRAVENOUS

## 2016-02-25 MED ORDER — THROMBIN 20000 UNITS EX SOLR
CUTANEOUS | Status: DC | PRN
  Administered 2016-02-25: 20 mL via TOPICAL

## 2016-02-25 MED ORDER — PHENYLEPHRINE HCL 10 MG/ML IJ SOLN
INTRAMUSCULAR | Status: DC | PRN
  Administered 2016-02-25 (×3): 80 ug via INTRAVENOUS

## 2016-02-25 MED ORDER — FENTANYL CITRATE (PF) 100 MCG/2ML IJ SOLN
INTRAMUSCULAR | Status: AC
  Filled 2016-02-25: qty 2

## 2016-02-25 MED ORDER — SODIUM CHLORIDE 0.9 % IV SOLN
INTRAVENOUS | Status: DC | PRN
  Administered 2016-02-25: 21:00:00 via INTRAVENOUS

## 2016-02-25 MED ORDER — HYDROMORPHONE HCL 1 MG/ML IJ SOLN
0.2500 mg | INTRAMUSCULAR | Status: DC | PRN
  Administered 2016-02-25: 0.5 mg via INTRAVENOUS

## 2016-02-25 MED ORDER — SODIUM CHLORIDE 0.9 % IV SOLN: 250.0000 mL | INTRAVENOUS | Status: DC

## 2016-02-25 MED ORDER — PROMETHAZINE HCL 25 MG/ML IJ SOLN: 6.2500 mg | INTRAMUSCULAR | Status: DC | PRN

## 2016-02-25 MED ORDER — ACETAMINOPHEN 650 MG RE SUPP: 650.0000 mg | RECTAL | Status: DC | PRN

## 2016-02-25 MED ORDER — FENTANYL CITRATE (PF) 100 MCG/2ML IJ SOLN
INTRAMUSCULAR | Status: DC | PRN
  Administered 2016-02-25 (×4): 50 ug via INTRAVENOUS
  Administered 2016-02-25: 100 ug via INTRAVENOUS

## 2016-02-25 MED ORDER — PANTOPRAZOLE SODIUM 40 MG PO TBEC
80.0000 mg | DELAYED_RELEASE_TABLET | Freq: Every day | ORAL | Status: DC
  Administered 2016-02-26: 80 mg via ORAL
  Filled 2016-02-25: qty 2

## 2016-02-25 MED ORDER — CYCLOBENZAPRINE HCL 10 MG PO TABS
10.0000 mg | ORAL_TABLET | Freq: Three times a day (TID) | ORAL | Status: DC | PRN
  Administered 2016-02-25 – 2016-02-26 (×2): 10 mg via ORAL
  Filled 2016-02-25: qty 1

## 2016-02-25 MED ORDER — SUGAMMADEX SODIUM 200 MG/2ML IV SOLN
INTRAVENOUS | Status: AC
  Filled 2016-02-25: qty 2

## 2016-02-25 MED ORDER — PHENYLEPHRINE HCL 10 MG/ML IJ SOLN
INTRAVENOUS | Status: DC | PRN
  Administered 2016-02-25: 50 ug/min via INTRAVENOUS

## 2016-02-25 MED ORDER — PROPOFOL 10 MG/ML IV BOLUS
INTRAVENOUS | Status: DC | PRN
  Administered 2016-02-25: 130 mg via INTRAVENOUS

## 2016-02-25 MED ORDER — ONDANSETRON HCL 4 MG/2ML IJ SOLN
INTRAMUSCULAR | Status: AC
  Filled 2016-02-25: qty 2

## 2016-02-25 MED ORDER — OXYCODONE HCL 5 MG PO TABS
ORAL_TABLET | ORAL | Status: AC
  Filled 2016-02-25: qty 2

## 2016-02-25 MED ORDER — SODIUM CHLORIDE 0.9 % IV SOLN
INTRAVENOUS | Status: DC
  Administered 2016-02-26: via INTRAVENOUS

## 2016-02-25 MED ORDER — LIDOCAINE HCL (CARDIAC) 20 MG/ML IV SOLN
INTRAVENOUS | Status: DC | PRN
  Administered 2016-02-25: 60 mg via INTRAVENOUS

## 2016-02-25 MED ORDER — HYDROMORPHONE HCL 1 MG/ML IJ SOLN
INTRAMUSCULAR | Status: AC
  Filled 2016-02-25: qty 0.5

## 2016-02-25 MED ORDER — FENTANYL CITRATE (PF) 100 MCG/2ML IJ SOLN
INTRAMUSCULAR | Status: AC
  Administered 2016-02-25: 50 ug
  Filled 2016-02-25: qty 2

## 2016-02-25 MED ORDER — SENNA 8.6 MG PO TABS
1.0000 | ORAL_TABLET | Freq: Two times a day (BID) | ORAL | Status: DC
  Administered 2016-02-25 – 2016-02-26 (×2): 8.6 mg via ORAL
  Filled 2016-02-25 (×2): qty 1

## 2016-02-25 MED ORDER — MORPHINE SULFATE 2 MG/ML IV SOLN
INTRAVENOUS | Status: AC
  Filled 2016-02-25: qty 25

## 2016-02-25 MED ORDER — 0.9 % SODIUM CHLORIDE (POUR BTL) OPTIME
TOPICAL | Status: DC | PRN
  Administered 2016-02-25: 1000 mL

## 2016-02-25 MED ORDER — BACITRACIN ZINC 500 UNIT/GM EX OINT
TOPICAL_OINTMENT | CUTANEOUS | Status: AC
  Filled 2016-02-25: qty 28.35

## 2016-02-25 MED ORDER — SODIUM CHLORIDE 0.9% FLUSH: 9.0000 mL | INTRAVENOUS | Status: DC | PRN

## 2016-02-25 MED ORDER — CEFAZOLIN SODIUM-DEXTROSE 2-4 GM/100ML-% IV SOLN
2.0000 g | INTRAVENOUS | Status: DC
  Filled 2016-02-25: qty 100

## 2016-02-25 MED ORDER — PHENYLEPHRINE HCL 10 MG/ML IJ SOLN: INTRAVENOUS | Status: DC | PRN

## 2016-02-25 MED ORDER — MENTHOL 3 MG MT LOZG: 1.0000 | LOZENGE | OROMUCOSAL | Status: DC | PRN

## 2016-02-25 MED ORDER — OXYCODONE-ACETAMINOPHEN 5-325 MG PO TABS
1.0000 | ORAL_TABLET | ORAL | Status: DC | PRN
  Administered 2016-02-25 – 2016-02-26 (×4): 2 via ORAL
  Filled 2016-02-25 (×4): qty 2

## 2016-02-25 MED ORDER — CITALOPRAM HYDROBROMIDE 20 MG PO TABS
20.0000 mg | ORAL_TABLET | Freq: Every day | ORAL | Status: DC
Start: 2016-02-25 — End: 2016-02-26
  Administered 2016-02-25: 20 mg via ORAL
  Filled 2016-02-25 (×2): qty 1
  Filled 2016-02-25: qty 2

## 2016-02-25 MED ORDER — OXYCODONE HCL 5 MG PO TABS
10.0000 mg | ORAL_TABLET | Freq: Once | ORAL | Status: AC
  Administered 2016-02-25: 10 mg via ORAL

## 2016-02-25 MED ORDER — LACTATED RINGERS IV SOLN
INTRAVENOUS | Status: DC | PRN
  Administered 2016-02-25 (×3): via INTRAVENOUS

## 2016-02-25 SURGICAL SUPPLY — 72 items
BENZOIN TINCTURE PRP APPL 2/3 (GAUZE/BANDAGES/DRESSINGS) ×3 IMPLANT
BLADE CLIPPER SURG (BLADE) IMPLANT
BUR ACORN 6.0 (BURR) ×2 IMPLANT
BUR ACORN 6.0MM (BURR) ×1
BUR MATCHSTICK NEURO 3.0 LAGG (BURR) ×3 IMPLANT
CANISTER SUCT 3000ML PPV (MISCELLANEOUS) ×3 IMPLANT
CAP REVERE LOCKING (Cap) ×6 IMPLANT
CARTRIDGE OIL MAESTRO DRILL (MISCELLANEOUS) ×1 IMPLANT
CLAMP CONNECTOR 6.5-6.5MM (Clamp) ×6 IMPLANT
CLOSURE WOUND 1/2 X4 (GAUZE/BANDAGES/DRESSINGS) ×1
CONT SPEC 4OZ CLIKSEAL STRL BL (MISCELLANEOUS) ×3 IMPLANT
COVER BACK TABLE 60X90IN (DRAPES) ×3 IMPLANT
DIFFUSER DRILL AIR PNEUMATIC (MISCELLANEOUS) ×3 IMPLANT
DRAPE C-ARM 42X72 X-RAY (DRAPES) ×6 IMPLANT
DRAPE LAPAROTOMY 100X72X124 (DRAPES) ×3 IMPLANT
DRAPE POUCH INSTRU U-SHP 10X18 (DRAPES) ×3 IMPLANT
DRSG OPSITE POSTOP 4X8 (GAUZE/BANDAGES/DRESSINGS) ×3 IMPLANT
DRSG PAD ABDOMINAL 8X10 ST (GAUZE/BANDAGES/DRESSINGS) IMPLANT
DURAPREP 26ML APPLICATOR (WOUND CARE) ×3 IMPLANT
ELECT BLADE 6.5 EXT (BLADE) ×6 IMPLANT
ELECT REM PT RETURN 9FT ADLT (ELECTROSURGICAL) ×3
ELECTRODE REM PT RTRN 9FT ADLT (ELECTROSURGICAL) ×1 IMPLANT
EVACUATOR 1/8 PVC DRAIN (DRAIN) IMPLANT
GAUZE SPONGE 4X4 12PLY STRL (GAUZE/BANDAGES/DRESSINGS) ×3 IMPLANT
GAUZE SPONGE 4X4 16PLY XRAY LF (GAUZE/BANDAGES/DRESSINGS) ×6 IMPLANT
GLOVE BIO SURGEON STRL SZ 6.5 (GLOVE) ×2 IMPLANT
GLOVE BIO SURGEON STRL SZ7 (GLOVE) ×3 IMPLANT
GLOVE BIO SURGEONS STRL SZ 6.5 (GLOVE) ×1
GLOVE BIOGEL M 8.0 STRL (GLOVE) ×3 IMPLANT
GLOVE BIOGEL PI IND STRL 7.0 (GLOVE) ×1 IMPLANT
GLOVE BIOGEL PI IND STRL 7.5 (GLOVE) ×1 IMPLANT
GLOVE BIOGEL PI INDICATOR 7.0 (GLOVE) ×2
GLOVE BIOGEL PI INDICATOR 7.5 (GLOVE) ×2
GLOVE EXAM NITRILE LRG STRL (GLOVE) IMPLANT
GLOVE EXAM NITRILE XL STR (GLOVE) IMPLANT
GLOVE EXAM NITRILE XS STR PU (GLOVE) IMPLANT
GOWN STRL REUS W/ TWL LRG LVL3 (GOWN DISPOSABLE) ×3 IMPLANT
GOWN STRL REUS W/ TWL XL LVL3 (GOWN DISPOSABLE) IMPLANT
GOWN STRL REUS W/TWL 2XL LVL3 (GOWN DISPOSABLE) IMPLANT
GOWN STRL REUS W/TWL LRG LVL3 (GOWN DISPOSABLE) ×6
GOWN STRL REUS W/TWL XL LVL3 (GOWN DISPOSABLE)
KIT BASIN OR (CUSTOM PROCEDURE TRAY) ×3 IMPLANT
KIT INFUSE SMALL (Orthopedic Implant) ×3 IMPLANT
KIT ROOM TURNOVER OR (KITS) ×3 IMPLANT
NEEDLE HYPO 18GX1.5 BLUNT FILL (NEEDLE) IMPLANT
NEEDLE HYPO 21X1.5 SAFETY (NEEDLE) IMPLANT
NEEDLE HYPO 25X1 1.5 SAFETY (NEEDLE) IMPLANT
NS IRRIG 1000ML POUR BTL (IV SOLUTION) ×3 IMPLANT
OIL CARTRIDGE MAESTRO DRILL (MISCELLANEOUS) ×3
PACK LAMINECTOMY NEURO (CUSTOM PROCEDURE TRAY) ×3 IMPLANT
PAD ARMBOARD 7.5X6 YLW CONV (MISCELLANEOUS) ×9 IMPLANT
PATTIES SURGICAL .5 X1 (DISPOSABLE) ×3 IMPLANT
PATTIES SURGICAL .5 X3 (DISPOSABLE) IMPLANT
PATTIES SURGICAL 1X1 (DISPOSABLE) ×3 IMPLANT
PENCIL BUTTON HOLSTER BLD 10FT (ELECTRODE) ×3 IMPLANT
ROD STRAIGHT REVERE 6.35 65MM (Rod) ×6 IMPLANT
SCREW REVERE 5.5X45 (Screw) ×6 IMPLANT
SPACER RISE 8X22 8-14MM-10 (Neuro Prosthesis/Implant) ×6 IMPLANT
SPONGE LAP 4X18 X RAY DECT (DISPOSABLE) IMPLANT
SPONGE NEURO XRAY DETECT 1X3 (DISPOSABLE) IMPLANT
SPONGE SURGIFOAM ABS GEL 100 (HEMOSTASIS) ×3 IMPLANT
STRIP CLOSURE SKIN 1/2X4 (GAUZE/BANDAGES/DRESSINGS) ×2 IMPLANT
SUT VIC AB 1 CT1 18XBRD ANBCTR (SUTURE) ×2 IMPLANT
SUT VIC AB 1 CT1 8-18 (SUTURE) ×4
SUT VIC AB 2-0 CP2 18 (SUTURE) ×6 IMPLANT
SUT VIC AB 3-0 SH 8-18 (SUTURE) ×3 IMPLANT
SYR 5ML LL (SYRINGE) IMPLANT
SYRINGE 20CC LL (MISCELLANEOUS) ×3 IMPLANT
TOWEL OR 17X24 6PK STRL BLUE (TOWEL DISPOSABLE) ×3 IMPLANT
TOWEL OR 17X26 10 PK STRL BLUE (TOWEL DISPOSABLE) ×3 IMPLANT
TRAY FOLEY W/METER SILVER 16FR (SET/KITS/TRAYS/PACK) ×3 IMPLANT
WATER STERILE IRR 1000ML POUR (IV SOLUTION) ×3 IMPLANT

## 2016-02-25 NOTE — H&P (Deleted)
  The note originally documented on this encounter has been moved the the encounter in which it belongs.  

## 2016-02-25 NOTE — Transfer of Care (Signed)
Immediate Anesthesia Transfer of Care Note  Patient: Clarence Davis  Procedure(s) Performed: Procedure(s) with comments: L3-4 POSTERIOR LUMBAR INTERBODY FUSION WITH AUGMENTATION FROM L4-5 (N/A) - L3-4 POSTERIOR LUMBAR INTERBODY FUSION WITH AUGMENTATION FROM L4-5  Patient Location: PACU  Anesthesia Type:General  Level of Consciousness: awake, alert  and oriented  Airway & Oxygen Therapy: Patient connected to face mask oxygen  Post-op Assessment: Report given to RN, Post -op Vital signs reviewed and stable and Patient moving all extremities X 4  Post vital signs: Reviewed and stable  Last Vitals:  Vitals:   02/25/16 1015  BP: 122/87  Pulse: 94  Resp: 18  Temp: 37 C    Last Pain:  Vitals:   02/25/16 1737  TempSrc:   PainSc: 8          Complications: No apparent anesthesia complications

## 2016-02-25 NOTE — Anesthesia Postprocedure Evaluation (Signed)
Anesthesia Post Note  Patient: Clarence Davis  Procedure(s) Performed: Procedure(s) (LRB): L3-4 POSTERIOR LUMBAR INTERBODY FUSION WITH AUGMENTATION FROM L4-5 (N/A)  Patient location during evaluation: PACU Anesthesia Type: General Level of consciousness: awake and alert Pain management: pain level controlled Vital Signs Assessment: post-procedure vital signs reviewed and stable Respiratory status: spontaneous breathing, nonlabored ventilation and respiratory function stable Cardiovascular status: blood pressure returned to baseline and stable Postop Assessment: no signs of nausea or vomiting Anesthetic complications: no    Last Vitals:  Vitals:   02/25/16 2300 02/25/16 2310  BP: 96/77 100/80  Pulse:  (!) 105  Resp: 20 15  Temp: 36.7 C     Last Pain:  Vitals:   02/25/16 2300  TempSrc:   PainSc: 5                  Adriella Essex,W. EDMOND

## 2016-02-25 NOTE — Anesthesia Procedure Notes (Signed)
Procedure Name: Intubation Date/Time: 02/25/2016 6:41 PM Performed by: Valetta Fuller Pre-anesthesia Checklist: Patient identified, Emergency Drugs available, Suction available and Patient being monitored Patient Re-evaluated:Patient Re-evaluated prior to inductionOxygen Delivery Method: Circle system utilized Preoxygenation: Pre-oxygenation with 100% oxygen Intubation Type: IV induction Ventilation: Mask ventilation without difficulty Laryngoscope Size: Miller and 3 Grade View: Grade I Tube type: Oral Tube size: 7.5 mm Number of attempts: 1 Airway Equipment and Method: Stylet Placement Confirmation: ETT inserted through vocal cords under direct vision,  positive ETCO2 and breath sounds checked- equal and bilateral Secured at: 21 cm Tube secured with: Tape Dental Injury: Teeth and Oropharynx as per pre-operative assessment  Comments: Intubation by R. Melburn Hake, Immunologist

## 2016-02-25 NOTE — H&P (Signed)
NAMEDARRIO, SPHAR              ACCOUNT NO.:  [id number]  MEDICAL RECORD NO.:  [id number]  LOCATION:  SDS                          FACILITY:  [facility]  PHYSICIAN:  Leeroy Cha, M.D.   DATE OF BIRTH:  12/02/1960  DATE OF ADMISSION:  02/19/2016 DATE OF DISCHARGE:  02/19/2016                             HISTORY & PHYSICAL   HISTORY OF PRESENT ILLNESS:  Mr. Squyres is a gentleman, who has been seen in my office and has multiple cervical lumbar procedures.  The last time he came to see me several weeks ago, he was having back pain with radiation to both lower extremities associated with weakness and difficulty sitting and standing.  The myelogram showed that the area where he had surgery at the level of lumbar 4-5 is solid and he has spondylolisthesis with degenerative disk disease and stenosis at L3-L4. He wants to proceed with surgery.  PAST MEDICAL HISTORY:  Anterior cervical diskectomy, lumbar fusion.  FAMILY HISTORY:  Unremarkable.  REVIEW OF SYSTEMS:  Positive for neck pain.  He also complained of numbness in both hands, pain with walking.  SOCIAL HISTORY:  He still smokes cigarettes.  PHYSICAL EXAMINATION:  HEAD, EAR, NOSE, AND THROAT:  Normal. NECK:  He has a scar with some decrease of mobility. CARDIOVASCULAR:  Clear. LUNGS:  Some mild rhonchi bilaterally. ABDOMEN:  Normal. EXTREMITIES:  Normal. NEUROLOGIC:  He is oriented x3 with normal cranial nerves.  Clinically, he has some weakness of the iliopsoas and quadriceps with the femoral stretch maneuver positive bilaterally.  He has some sensory changes, which involve mostly the L3 and L4 nerve roots.  The myelogram showed the area where he had surgery lumbar 4-5 is solid, but at the level of 3- 4, he has foraminal narrowing with spondylolisthesis.  RECOMMENDATION:  Adjacent degenerative disk disease at the lower lumbar 3-4 with a prior fusion at the lumbar 4-5.  The patient is being admitted for surgery and the  procedure will be a bilateral L3 laminectomy, most likely diskectomy with interbody cages and augmentation of the fusion from L4 down to L3.  Also, we are going to be doing a posterolateral arthrodesis.  He and his family are aware of the risks and benefits including the possibility of infection, CSF leak, worsening of the pain, and need of further surgery.          ______________________________ Leeroy Cha, M.D.     EB/MEDQ  D:  02/24/2016  T:  02/25/2016  Job:  [id number]

## 2016-02-25 NOTE — Anesthesia Preprocedure Evaluation (Addendum)
Anesthesia Evaluation  Patient identified by MRN, date of birth, ID band Patient awake    Reviewed: Allergy & Precautions, NPO status , Patient's Chart, lab work & pertinent test results  Airway Mallampati: II  TM Distance: >3 FB Neck ROM: Full    Dental no notable dental hx. (+) Teeth Intact   Pulmonary Current Smoker, former smoker,    Pulmonary exam normal breath sounds clear to auscultation       Cardiovascular Normal cardiovascular exam Rhythm:Regular Rate:Normal  Nuclear stress test 03/18/12 (in correspondence in media tab 12/26/12): - small to moderate inferior posterior partially reversible defect which may represent varying attenuation artifact versus some degree of ischemia which cannot totally be excluded  Echo 03/16/12 (in correspondence in media tab 12/26/12): 1. EF 55-60%. Normal LV size wall thickness, with normal systolic and diastolic function. 2. Mildly dilated LA. 3. Normal aortic valve, without any evidence of aortic stenosis or insufficiency. 4. Trace mitral and tricuspid valve regurgitation.   Neuro/Psych  Headaches, PSYCHIATRIC DISORDERS Anxiety Depression  Neuromuscular disease    GI/Hepatic GERD  Medicated and Controlled,(+) Hepatitis -, C  Endo/Other  negative endocrine ROS  Renal/GU negative Renal ROS  negative genitourinary   Musculoskeletal  (+) Arthritis , Cervical radiculopathy   Abdominal   Peds  Hematology negative hematology ROS (+)   Anesthesia Other Findings   Reproductive/Obstetrics                            Anesthesia Physical  Anesthesia Plan  ASA: II  Anesthesia Plan: General   Post-op Pain Management:    Induction: Intravenous  Airway Management Planned: Oral ETT  Additional Equipment:   Intra-op Plan:   Post-operative Plan: Extubation in OR  Informed Consent: I have reviewed the patients History and Physical, chart, labs and discussed  the procedure including the risks, benefits and alternatives for the proposed anesthesia with the patient or authorized representative who has indicated his/her understanding and acceptance.   Dental advisory given  Plan Discussed with: CRNA  Anesthesia Plan Comments:        Anesthesia Quick Evaluation

## 2016-02-25 NOTE — Progress Notes (Signed)
Patient arrived to unit via PACU staff. Vitals taken and stable; PCA pump already set up. Continue to monitor.

## 2016-02-26 ENCOUNTER — Encounter (HOSPITAL_COMMUNITY): Payer: Self-pay | Admitting: *Deleted

## 2016-02-26 MED FILL — Heparin Sodium (Porcine) Inj 1000 Unit/ML: INTRAMUSCULAR | Qty: 30 | Status: AC

## 2016-02-26 MED FILL — Sodium Chloride IV Soln 0.9%: INTRAVENOUS | Qty: 2000 | Status: AC

## 2016-02-26 NOTE — Progress Notes (Signed)
Pt discharged at this time taking all personal belongings. Discharge instructions with prescriptions provided with verbal understanding. Pt will follow up with MD per dc summary. No noted distress.

## 2016-02-26 NOTE — Clinical Social Work Note (Signed)
CSW consulted for New SNF. PT is not recommending any follow up. CSW will update RNCM. CSW is signing off as no further needs identified.   Darden Dates, MSW, LCSW Clinical Social Worker [phone]

## 2016-02-26 NOTE — Progress Notes (Signed)
OT NOTE  OT called [employer] and alerted to pending aspen brace order in patients chart. RN Laquitta made aware of pending brace arrival   Clarence Davis   OTR/L Pager: [phone] Office: [phone] .

## 2016-02-26 NOTE — Progress Notes (Signed)
RN entered room to give pt pain meds and noticed pt drain system full of air although emptied just over one hour prior; upon assessment of site, dressing partially off and saturated as well as bed pad. Drain immediately fills with air when charged with very little drainage.  On call notified 30, Ditty MD returned paged [phone] with orders to d/c drain and redress with honeycomb.  Continue to monitor patient.

## 2016-02-26 NOTE — Progress Notes (Signed)
Posterior drain d/c'd, back redressed. Foley d/c'd. Not able to get OOB as pt does not have lumbar brace at this time. Continue to monitor.

## 2016-02-26 NOTE — Evaluation (Signed)
Occupational Therapy Evaluation Patient Details Name: Clarence Davis MRN: [id number] DOB: 04/07/1960 Today's Date: 02/26/2016    History of Present Illness L3-4 posterolateral arthrodesis with autograft adn BMP  Past Medical History:  Diagnosis Date  . Alcoholic ([place])    quit drinking many yrs ago per pt  . Anxiety   . Arthritis    "hands" (04/05/2015)  . Bilateral carpal tunnel syndrome   . Chronic back pain    "all over"  . Chronic diarrhea   . Depression    takes Citalopram daily  . Dry skin   . Dysrhythmia    PVC's   . GERD (gastroesophageal reflux disease)    takes Omeprazole daily  . Head injury    early 62's  . Headache    "weekly" (04/05/2015)  . Hepatitis C    "treated w/RX; don't have it anymore" (04/05/2015)      Clinical Impression   Patient evaluated by Occupational Therapy with no further acute OT needs identified. All education has been completed and the patient has no further questions. See below for any follow-up Occupational Therapy or equipment needs. OT to sign off. Thank you for referral.      Follow Up Recommendations  No OT follow up    Equipment Recommendations  None recommended by OT    Recommendations for Other Services       Precautions / Restrictions Precautions Precautions: Back Precaution Comments: handout provided and reviewed in detail Required Braces or Orthoses: Other Brace/Splint (pending arrival of aspen)      Mobility Bed Mobility Overal bed mobility: Modified Independent             General bed mobility comments: pt with HOB 30 degrees   Transfers Overall transfer level: Modified independent                    Balance                                            ADL Overall ADL's : Modified independent       Back handout provided and reviewed adls in detail. Pt educated on: clothing between brace, never sleep in brace, set an alarm at night for medication, avoid sitting  for long periods of time, correct bed positioning for sleeping, correct sequence for bed mobility, avoiding lifting more than 5 pounds and never wash directly over incision. All education is complete and patient indicates understanding.                                   General ADL Comments: able to don socks and pants this session will have wife (A) if required. Pt with previous brace confirmed by [employer] and patient . pt with new brace due to weight loss     Vision     Perception     Praxis      Pertinent Vitals/Pain Pain Assessment: 0-10 Pain Score: 4  Pain Location: back  Pain Descriptors / Indicators: Operative site guarding Pain Intervention(s): Repositioned;Premedicated before session;Monitored during session;Limited activity within patient's tolerance;Other (comment) (has PCA)     Hand Dominance Right   Extremity/Trunk Assessment Upper Extremity Assessment Upper Extremity Assessment: Overall WFL for tasks assessed   Lower Extremity Assessment Lower Extremity Assessment: Defer to PT evaluation  Cervical / Trunk Assessment Cervical / Trunk Assessment: Other exceptions (s/p surg )   Communication Communication Communication: No difficulties   Cognition Arousal/Alertness: Awake/alert Behavior During Therapy: WFL for tasks assessed/performed Overall Cognitive Status: Within Functional Limits for tasks assessed                     General Comments       Exercises       Shoulder Instructions      Home Living Family/patient expects to be discharged to:: Private residence Living Arrangements: Spouse/significant other Available Help at Discharge: Family;Available 24 hours/day Type of Home: House Home Access: Stairs to enter [employer] of Steps: 4 Entrance Stairs-Rails: Right Home Layout: One level     Bathroom Shower/Tub: Occupational psychologist: Standard     Home Equipment: Cane - single point;Other  (comment) (back brace)   Additional Comments: back entrance used- 3 steps with rail with pavers before the steps      Prior Functioning/Environment Level of Independence: Independent                 OT Problem List:     OT Treatment/Interventions:      OT Goals(Current goals can be found in the care plan section)    OT Frequency:     Barriers to D/C:            Co-evaluation              End of Session Equipment Utilized During Treatment: Gait belt Nurse Communication: Mobility status;Precautions  Activity Tolerance: Patient tolerated treatment well Patient left: Other (comment) (in hall with PT Santiago Glad)   [id number] OT Time Calculation (min): 15 min Charges:  OT General Charges $OT Visit: 1 Procedure OT Evaluation $OT Eval Moderate Complexity: 1 Procedure G-Codes:    Parke Poisson B Mar 23, 2016, 10:03 AM    Jeri Modena   OTR/L PagerIP:[phone] Office: [phone] .

## 2016-02-26 NOTE — Progress Notes (Signed)
Patient ID: Clarence Davis, male   DOB: [birth year], 55 y.o.   MRN: [id number] oob ambulating. Decrease drainage. Dc this pm?

## 2016-02-26 NOTE — Progress Notes (Signed)
Pt ambulated standby assist with cane in the hallway. Gait steady. No noted distress. Will continue to monitor.

## 2016-02-26 NOTE — Op Note (Signed)
Clarence Davis, Clarence Davis              ACCOUNT NO.:  [id number]  MEDICAL RECORD NO.:  [id number]  LOCATION:  PERIO                        FACILITY:  [facility]  PHYSICIAN:  Leeroy Cha, M.D.   DATE OF BIRTH:  06/11/1960  DATE OF PROCEDURE:  02/25/2016 DATE OF DISCHARGE:                              OPERATIVE REPORT   PREOPERATIVE DIAGNOSES:  L3-L4 degenerative disk disease, spondylolisthesis, chronic upper lumbar radiculopathy, status post fusion at L4-5.  POSTOPERATIVE DIAGNOSES:  L3-L4 degenerative disk disease, spondylolisthesis, chronic upper lumbar radiculopathy, status post fusion at L4-5.  PROCEDURES:  L3 bilateral laminectomy and facetectomy, lysis of adhesion, bilateral diskectomy more than normal to introduce two cages, expandable.  Pedicle screws at L3, when tensioned from L4-L3 with hooks. Posterolateral arthrodesis with autograft and BMP.  Cell Saver.  C-arm.  SURGEON:  Leeroy Cha, M.D.  ASSISTANT:  Dr. Cyndy Freeze.  CLINICAL HISTORY:  Clarence Davis is a gentleman, who in the past underwent fusion at L4-5.  The patient did well, but lately, he had been complaining of back pain radiation to both legs associated with weakness of the quadriceps and the iliopsoas.  Conservative treatment did not help.  Myelogram showed that he has a degenerative disk disease at the level of L3-L4 with abnormal movement.  Foraminal stenosis.  Surgery was advised and he knew the risks and benefits.  DESCRIPTION OF PROCEDURE:  The patient was taken to the OR and after intubation, he was positioned in a prone manner.  The back was prepped with DuraPrep.  Drapes were applied.  Midline incision following the previous one was made and muscles were retracted all the way laterally. Indeed, we were able to find the pedicles at L4.  We went all the way superolaterally until we found the lateral aspect of the L3 facet.  From then on, we removed the spinous process of L3, the lamina.  The patient had  quite a bit of adhesion.  Lysis was accomplished.  Bilateral L3 facetectomy was done.  Then, we approached the disk first in the right side and then in the left side.  Retraction was made and total diskectomy more than normal to introduce two expandable cages.  The cages were filled up with BMP and autograft.  Having done this, reposition was to be made, but at the end, both cages were in normal position midline.  Then, using the C-arm first in AP view and then in laterally, we made a hole in the pedicle of L3.  Prior to insertion of the pedicle screws, we feel all four quadrants just to be sure that we were surrounded by bone.  Two new set screws of 5.5 x 45 were inserted. Then, the crosslink between L4-L5 was removed and two hooks right at the previous rods were inserted.  Then, using two different new rods in U shape, they were inserted from the rods all the way down to the L3 pedicle screws.  They were secured in place with Capps.  Lateral lumbar spine x-ray, AP showed good position of the screws and the cages. During the procedure, the patient had quite a bit of bleeding and it turned around, then the patient was taking strong anti-inflammatory. Nevertheless, hemostasis  was accomplished at the end.  A large Hemovac was left into the area. Prior to closure, we removed the lateral aspect of the periosteum of L3- L4, and a mix of BMP and autograft were used for arthrodesis.  The wound was closed with different layer of Vicryl and the skin with staple.  A Hemovac was left in the operative site.          ______________________________ Leeroy Cha, M.D.     EB/MEDQ  D:  02/25/2016  T:  02/26/2016  Job:  [id number]

## 2016-02-26 NOTE — Progress Notes (Signed)
Pt reported IV removed when he was repositioning in bed. Catheter in place. No bleeding. Dry dressing applied. MD paged.

## 2016-02-26 NOTE — Discharge Summary (Signed)
Physician Discharge Summary  Patient ID: Wayden Linstrom MRN: [id number] DOB/AGE: 10/27/1960 55 y.o.  Admit date: 02/25/2016 Discharge date: 02/26/2016  Admission Diagnoses:lumbar 3-4 spondylolisthesis, stenosis  Discharge Diagnoses:  Active Problems:   Lumbar adjacent segment disease with spondylolisthesis   Discharged Condition: no pain, no weakness. ambulating  Hospital Course: surgery  Consults: none  Significant Diagnostic Studies: myelogtram  Treatments: decompression and fusion l3-4  Discharge Exam:    Ambulating, no weakness. Wants to go home Blood pressure 96/70, pulse (!) 113, temperature 100 F (37.8 C), temperature source Oral, resp. rate 20, height 5\' 10"  (1.778 m), weight 87.6 kg (193 lb 3.2 oz), SpO2 92 %. Wound dry.  Disposition: 01-Home or Self Care      Signed: Floyce Stakes 02/26/2016, 4:36 PM

## 2016-02-26 NOTE — Evaluation (Signed)
Physical Therapy Evaluation & discharge Patient Details Name: Clarence Davis MRN: [id number] DOB: August 01, 1960 Today's Date: 02/26/2016   History of Present Illness  L3-4 posterolateral arthrodesis with autograft and BMP  Clinical Impression  Pt moving at MOD I to S level with good knowledge of back precautions.  Pt will have support from wife at home.  Discussed use of brace and need to wear when sitting up.  No further acute PT needs identified and will sign off.    Follow Up Recommendations No PT follow up    Equipment Recommendations  None recommended by PT    Recommendations for Other Services       Precautions / Restrictions Precautions Precautions: Fall Precaution Comments: pt able to recall precautions from earlier session with OT Required Braces or Orthoses: Other Brace/Splint Other Brace/Splint: pending arrival of [place].  MD office okayed pt working with therapy without, but to return to bed. Restrictions Weight Bearing Restrictions: No      Mobility  Bed Mobility Overal bed mobility: Modified Independent             General bed mobility comments: bed flat with sit > L sidelying > supine  Transfers Overall transfer level: Modified independent                  Ambulation/Gait Ambulation/Gait assistance: Supervision;Modified independent (Device/Increase time) Ambulation Distance (Feet): 275 Feet Assistive device:  (IV pole)       General Gait Details: cues to maintain no twisting precaution when ambulating and distracted. guarded gait pattern  Stairs Stairs: Yes Stairs assistance: Supervision Stair Management: One rail Right;Alternating pattern;Step to pattern;Forwards;Backwards Number of Stairs: 3 General stair comments: Step to pattern with 1 rail with ascent going forwards.  With descent, he did alternating pattern and backwards due to IV   Wheelchair Mobility    Modified Rankin (Stroke Patients Only)       Balance Overall balance  assessment: No apparent balance deficits (not formally assessed)                                           Pertinent Vitals/Pain Pain Assessment: 0-10 Pain Score: 8  Pain Location: back as gait progressed Pain Descriptors / Indicators: Operative site guarding Pain Intervention(s): Monitored during session;Repositioned;Limited activity within patient's tolerance;Other (comment);PCA encouraged    Home Living Family/patient expects to be discharged to:: Private residence Living Arrangements: Spouse/significant other Available Help at Discharge: Family;Available 24 hours/day Type of Home: House Home Access: Stairs to enter Entrance Stairs-Rails: Right Entrance Stairs-Number of Steps: 4 Home Layout: One level Home Equipment: Cane - single point;Other (comment) (back brace) Additional Comments: back entrance used- 3 steps with rail with pavers before the steps    Prior Function Level of Independence: Independent               Hand Dominance   Dominant Hand: Right    Extremity/Trunk Assessment   Upper Extremity Assessment: Defer to OT evaluation           Lower Extremity Assessment: Overall WFL for tasks assessed      Cervical / Trunk Assessment: Other exceptions (s/p surg )  Communication   Communication: No difficulties  Cognition Arousal/Alertness: Awake/alert Behavior During Therapy: WFL for tasks assessed/performed Overall Cognitive Status: Within Functional Limits for tasks assessed  General Comments      Exercises     Assessment/Plan    PT Assessment Patent does not need any further PT services  PT Problem List            PT Treatment Interventions      PT Goals (Current goals can be found in the Care Plan section)  Acute Rehab PT Goals PT Goal Formulation: All assessment and education complete, DC therapy    Frequency     Barriers to discharge        Co-evaluation                End of Session   Activity Tolerance: Patient tolerated treatment well Patient left: in bed           Time: [id number] PT Time Calculation (min) (ACUTE ONLY): 15 min   Charges:   PT Evaluation $PT Eval Low Complexity: 1 Procedure     PT G Codes:        Corretta Munce LUBECK 02/26/2016, 10:32 AM

## 2016-03-02 ENCOUNTER — Encounter (HOSPITAL_COMMUNITY): Payer: Self-pay | Admitting: Neurosurgery

## 2016-04-29 DIAGNOSIS — M546 Pain in thoracic spine: Secondary | ICD-10-CM | POA: Diagnosis not present

## 2016-04-29 DIAGNOSIS — M150 Primary generalized (osteo)arthritis: Secondary | ICD-10-CM | POA: Diagnosis not present

## 2016-06-26 NOTE — Progress Notes (Deleted)
Office Visit Note  Patient: Clarence Davis             Date of Birth: 03/31/1961           MRN: [id number]             PCP: Lillard Anes, MD Referring: Lillard Anes,* Visit Date: 06/30/2016 Occupation: @GUAROCC @    Subjective:  No chief complaint on file.   History of Present Illness: Clarence Davis is a 56 y.o. male ***   Activities of Daily Living:  Patient reports morning stiffness for *** {minute/hour:19697}.   Patient {ACTIONS;DENIES/REPORTS:21021675::"Denies"} nocturnal pain.  Difficulty dressing/grooming: {ACTIONS;DENIES/REPORTS:21021675::"Denies"} Difficulty climbing stairs: {ACTIONS;DENIES/REPORTS:21021675::"Denies"} Difficulty getting out of chair: {ACTIONS;DENIES/REPORTS:21021675::"Denies"} Difficulty using hands for taps, buttons, cutlery, and/or writing: {ACTIONS;DENIES/REPORTS:21021675::"Denies"}   No Rheumatology ROS completed.   PMFS History:  Patient Active Problem List   Diagnosis Date Noted  . Lumbar adjacent segment disease with spondylolisthesis 02/25/2016  . Cervical pseudoarthrosis ([place]) 04/05/2015  . Cervical stenosis of spinal canal 12/15/2013    Past Medical History:  Diagnosis Date  . Alcoholic ([place])    quit drinking many yrs ago per pt  . Anxiety   . Arthritis    "hands" (04/05/2015)  . Bilateral carpal tunnel syndrome   . Chronic back pain    "all over"  . Chronic diarrhea   . Depression    takes Citalopram daily  . Dry skin   . Dysrhythmia    PVC's   . GERD (gastroesophageal reflux disease)    takes Omeprazole daily  . Head injury    early 61's  . Headache    "weekly" (04/05/2015)  . Hepatitis C    "treated w/RX; don't have it anymore" (04/05/2015)    Family History  Problem Relation Age of Onset  . Heart disease Sister    Past Surgical History:  Procedure Laterality Date  . ANTERIOR CERVICAL DECOMP/DISCECTOMY FUSION N/A 12/15/2013   Procedure: CERVICAL FOUR TO FIVE, CERVICAL FIVE TO SIX, CERVICAL SIX  TO SEVEN CERVICAL DECOMPRESSION/DISCECTOMY FUSION 3 LEVELS;  Surgeon: Floyce Stakes, MD;  Location: [facility];  Service: Neurosurgery;  Laterality: N/A;  C4-5 C5-6 C6-7 Anterior cervical decompression/diskectomy/fusion  . BACK SURGERY    . CLOSED REDUCTION SHOULDER DISLOCATION Left 1975   patient denies  . ESOPHAGOGASTRODUODENOSCOPY    . HERNIA REPAIR    . INGUINAL HERNIA REPAIR Left X 3   "  . POSTERIOR CERVICAL FUSION/FORAMINOTOMY  04/05/2015  . POSTERIOR CERVICAL FUSION/FORAMINOTOMY N/A 04/05/2015   Procedure: Cervical three-four, Cervical four-five, Cervical five-six, Cervical six-seven Posterior cervical fusion with lateral mass fixation;  Surgeon: Leeroy Cha, MD;  Location: [facility];  Service: Neurosurgery;  Laterality: N/A;  C3-4 C4-5 C5-6 C6-7 Posterior cervical fusion with lateral mass fixation  . UMBILICAL HERNIA REPAIR    . WISDOM TOOTH EXTRACTION     Social History   Social History Narrative  . No narrative on file     Objective: Vital Signs: There were no vitals taken for this visit.   Physical Exam   Musculoskeletal Exam: ***  CDAI Exam: No CDAI exam completed.    Investigation: No additional findings.   Imaging: No results found.  Speciality Comments: No specialty comments available.    Procedures:  No procedures performed Allergies: Tramadol   Assessment / Plan:     Visit Diagnoses: Cervical stenosis of spinal canal  Polyarthralgia  Spondylosis of lumbar region without myelopathy or radiculopathy  History of depression  Benign prostatic hyperplasia with urinary  hesitancy    Orders: No orders of the defined types were placed in this encounter.  No orders of the defined types were placed in this encounter.   Face-to-face time spent with patient was *** minutes. 50% of time was spent in counseling and coordination of care.  Follow-Up Instructions: No Follow-up on file.   Bo Merino, MD  Note - This record has  been created using Editor, commissioning.  Chart creation errors have been sought, but may not always  have been located. Such creation errors do not reflect on  the standard of medical care.

## 2016-06-30 ENCOUNTER — Ambulatory Visit: Payer: Self-pay | Admitting: Rheumatology

## 2016-07-01 DIAGNOSIS — M5416 Radiculopathy, lumbar region: Secondary | ICD-10-CM | POA: Diagnosis not present

## 2016-07-01 DIAGNOSIS — M5412 Radiculopathy, cervical region: Secondary | ICD-10-CM | POA: Diagnosis not present

## 2016-08-03 ENCOUNTER — Ambulatory Visit: Payer: Self-pay | Admitting: Rheumatology

## 2016-08-19 DIAGNOSIS — R5382 Chronic fatigue, unspecified: Secondary | ICD-10-CM | POA: Diagnosis not present

## 2016-08-19 DIAGNOSIS — M4807 Spinal stenosis, lumbosacral region: Secondary | ICD-10-CM | POA: Diagnosis not present

## 2016-08-19 DIAGNOSIS — N401 Enlarged prostate with lower urinary tract symptoms: Secondary | ICD-10-CM | POA: Diagnosis not present

## 2016-11-03 DIAGNOSIS — M5003 Cervical disc disorder with myelopathy, cervicothoracic region: Secondary | ICD-10-CM | POA: Diagnosis not present

## 2016-11-11 ENCOUNTER — Other Ambulatory Visit: Payer: Self-pay | Admitting: Physician Assistant

## 2016-11-11 DIAGNOSIS — M5412 Radiculopathy, cervical region: Secondary | ICD-10-CM

## 2016-11-11 DIAGNOSIS — T1590XA Foreign body on external eye, part unspecified, unspecified eye, initial encounter: Secondary | ICD-10-CM

## 2016-11-23 ENCOUNTER — Ambulatory Visit
Admission: RE | Admit: 2016-11-23 | Discharge: 2016-11-23 | Disposition: A | Discharge status: Home / Self Care | Payer: Medicare HMO | Source: Ambulatory Visit | Attending: Physician Assistant | Admitting: Physician Assistant

## 2016-11-23 DIAGNOSIS — M47812 Spondylosis without myelopathy or radiculopathy, cervical region: Secondary | ICD-10-CM | POA: Diagnosis not present

## 2016-11-23 DIAGNOSIS — M5412 Radiculopathy, cervical region: Secondary | ICD-10-CM

## 2016-11-23 DIAGNOSIS — M4322 Fusion of spine, cervical region: Secondary | ICD-10-CM | POA: Diagnosis not present

## 2016-11-23 DIAGNOSIS — Z01818 Encounter for other preprocedural examination: Secondary | ICD-10-CM | POA: Diagnosis not present

## 2016-11-23 DIAGNOSIS — T1590XA Foreign body on external eye, part unspecified, unspecified eye, initial encounter: Secondary | ICD-10-CM

## 2016-11-23 MED ORDER — GADOBENATE DIMEGLUMINE 529 MG/ML IV SOLN
20.0000 mL | Freq: Once | INTRAVENOUS | Status: AC | PRN
  Administered 2016-11-23: 20 mL via INTRAVENOUS

## 2016-11-25 DIAGNOSIS — M5414 Radiculopathy, thoracic region: Secondary | ICD-10-CM | POA: Diagnosis not present

## 2016-12-02 DIAGNOSIS — M5414 Radiculopathy, thoracic region: Secondary | ICD-10-CM | POA: Diagnosis not present

## 2016-12-02 DIAGNOSIS — M5124 Other intervertebral disc displacement, thoracic region: Secondary | ICD-10-CM | POA: Diagnosis not present

## 2017-01-07 DIAGNOSIS — M5414 Radiculopathy, thoracic region: Secondary | ICD-10-CM | POA: Diagnosis not present

## 2017-01-11 DIAGNOSIS — M5414 Radiculopathy, thoracic region: Secondary | ICD-10-CM | POA: Diagnosis not present

## 2017-01-19 DIAGNOSIS — M546 Pain in thoracic spine: Secondary | ICD-10-CM | POA: Diagnosis not present

## 2017-01-26 DIAGNOSIS — M4804 Spinal stenosis, thoracic region: Secondary | ICD-10-CM | POA: Diagnosis not present

## 2017-01-26 DIAGNOSIS — J441 Chronic obstructive pulmonary disease with (acute) exacerbation: Secondary | ICD-10-CM | POA: Diagnosis not present

## 2017-02-28 DIAGNOSIS — S3991XA Unspecified injury of abdomen, initial encounter: Secondary | ICD-10-CM | POA: Diagnosis not present

## 2017-02-28 DIAGNOSIS — I700 Atherosclerosis of aorta: Secondary | ICD-10-CM | POA: Diagnosis not present

## 2017-02-28 DIAGNOSIS — S2232XA Fracture of one rib, left side, initial encounter for closed fracture: Secondary | ICD-10-CM | POA: Diagnosis not present

## 2017-02-28 DIAGNOSIS — K7460 Unspecified cirrhosis of liver: Secondary | ICD-10-CM | POA: Diagnosis not present

## 2017-02-28 DIAGNOSIS — F17210 Nicotine dependence, cigarettes, uncomplicated: Secondary | ICD-10-CM | POA: Diagnosis not present

## 2017-02-28 DIAGNOSIS — S2242XA Multiple fractures of ribs, left side, initial encounter for closed fracture: Secondary | ICD-10-CM | POA: Diagnosis not present

## 2017-03-05 DIAGNOSIS — M545 Low back pain: Secondary | ICD-10-CM | POA: Diagnosis not present

## 2017-03-05 DIAGNOSIS — M5416 Radiculopathy, lumbar region: Secondary | ICD-10-CM | POA: Diagnosis not present

## 2017-03-05 DIAGNOSIS — G8929 Other chronic pain: Secondary | ICD-10-CM | POA: Diagnosis not present

## 2017-04-08 DIAGNOSIS — M5416 Radiculopathy, lumbar region: Secondary | ICD-10-CM | POA: Diagnosis not present

## 2017-04-08 DIAGNOSIS — Z720 Tobacco use: Secondary | ICD-10-CM | POA: Diagnosis not present

## 2017-04-08 DIAGNOSIS — M5136 Other intervertebral disc degeneration, lumbar region: Secondary | ICD-10-CM | POA: Diagnosis not present

## 2017-04-08 DIAGNOSIS — M549 Dorsalgia, unspecified: Secondary | ICD-10-CM | POA: Diagnosis not present

## 2017-04-08 DIAGNOSIS — M961 Postlaminectomy syndrome, not elsewhere classified: Secondary | ICD-10-CM | POA: Diagnosis not present

## 2017-04-08 DIAGNOSIS — F1120 Opioid dependence, uncomplicated: Secondary | ICD-10-CM | POA: Diagnosis not present

## 2017-04-08 DIAGNOSIS — Z981 Arthrodesis status: Secondary | ICD-10-CM | POA: Diagnosis not present

## 2017-04-08 DIAGNOSIS — G894 Chronic pain syndrome: Secondary | ICD-10-CM | POA: Diagnosis not present

## 2017-04-19 DIAGNOSIS — G8929 Other chronic pain: Secondary | ICD-10-CM | POA: Diagnosis not present

## 2017-04-19 DIAGNOSIS — M546 Pain in thoracic spine: Secondary | ICD-10-CM | POA: Diagnosis not present

## 2017-04-19 DIAGNOSIS — F17210 Nicotine dependence, cigarettes, uncomplicated: Secondary | ICD-10-CM | POA: Diagnosis not present

## 2017-04-19 DIAGNOSIS — M549 Dorsalgia, unspecified: Secondary | ICD-10-CM | POA: Diagnosis not present

## 2017-04-19 DIAGNOSIS — K219 Gastro-esophageal reflux disease without esophagitis: Secondary | ICD-10-CM | POA: Diagnosis not present

## 2017-04-19 DIAGNOSIS — R079 Chest pain, unspecified: Secondary | ICD-10-CM | POA: Diagnosis not present

## 2017-04-28 DIAGNOSIS — M5414 Radiculopathy, thoracic region: Secondary | ICD-10-CM | POA: Diagnosis not present

## 2017-04-28 DIAGNOSIS — R030 Elevated blood-pressure reading, without diagnosis of hypertension: Secondary | ICD-10-CM | POA: Diagnosis not present

## 2017-04-28 DIAGNOSIS — M546 Pain in thoracic spine: Secondary | ICD-10-CM | POA: Diagnosis not present

## 2017-04-28 DIAGNOSIS — Z6826 Body mass index (BMI) 26.0-26.9, adult: Secondary | ICD-10-CM | POA: Diagnosis not present

## 2017-06-07 DIAGNOSIS — M130 Polyarthritis, unspecified: Secondary | ICD-10-CM | POA: Diagnosis not present

## 2017-06-07 DIAGNOSIS — M25561 Pain in right knee: Secondary | ICD-10-CM | POA: Diagnosis not present

## 2017-06-08 DIAGNOSIS — Z981 Arthrodesis status: Secondary | ICD-10-CM | POA: Diagnosis not present

## 2017-06-08 DIAGNOSIS — G894 Chronic pain syndrome: Secondary | ICD-10-CM | POA: Diagnosis not present

## 2017-06-08 DIAGNOSIS — M5416 Radiculopathy, lumbar region: Secondary | ICD-10-CM | POA: Diagnosis not present

## 2017-06-08 DIAGNOSIS — Z720 Tobacco use: Secondary | ICD-10-CM | POA: Diagnosis not present

## 2017-06-08 DIAGNOSIS — M961 Postlaminectomy syndrome, not elsewhere classified: Secondary | ICD-10-CM | POA: Diagnosis not present

## 2017-06-08 DIAGNOSIS — M5136 Other intervertebral disc degeneration, lumbar region: Secondary | ICD-10-CM | POA: Diagnosis not present

## 2017-06-08 DIAGNOSIS — M549 Dorsalgia, unspecified: Secondary | ICD-10-CM | POA: Diagnosis not present

## 2017-07-06 DIAGNOSIS — G894 Chronic pain syndrome: Secondary | ICD-10-CM | POA: Diagnosis not present

## 2017-07-06 DIAGNOSIS — M549 Dorsalgia, unspecified: Secondary | ICD-10-CM | POA: Diagnosis not present

## 2017-07-06 DIAGNOSIS — M5136 Other intervertebral disc degeneration, lumbar region: Secondary | ICD-10-CM | POA: Diagnosis not present

## 2017-07-06 DIAGNOSIS — Z720 Tobacco use: Secondary | ICD-10-CM | POA: Diagnosis not present

## 2017-07-06 DIAGNOSIS — M961 Postlaminectomy syndrome, not elsewhere classified: Secondary | ICD-10-CM | POA: Diagnosis not present

## 2017-07-06 DIAGNOSIS — M5416 Radiculopathy, lumbar region: Secondary | ICD-10-CM | POA: Diagnosis not present

## 2017-07-06 DIAGNOSIS — Z981 Arthrodesis status: Secondary | ICD-10-CM | POA: Diagnosis not present

## 2017-08-06 DIAGNOSIS — M549 Dorsalgia, unspecified: Secondary | ICD-10-CM | POA: Diagnosis not present

## 2017-08-06 DIAGNOSIS — G894 Chronic pain syndrome: Secondary | ICD-10-CM | POA: Diagnosis not present

## 2017-08-06 DIAGNOSIS — M5136 Other intervertebral disc degeneration, lumbar region: Secondary | ICD-10-CM | POA: Diagnosis not present

## 2017-08-06 DIAGNOSIS — Z79891 Long term (current) use of opiate analgesic: Secondary | ICD-10-CM | POA: Diagnosis not present

## 2017-08-06 DIAGNOSIS — Z720 Tobacco use: Secondary | ICD-10-CM | POA: Diagnosis not present

## 2017-08-06 DIAGNOSIS — M961 Postlaminectomy syndrome, not elsewhere classified: Secondary | ICD-10-CM | POA: Diagnosis not present

## 2017-08-06 DIAGNOSIS — Z981 Arthrodesis status: Secondary | ICD-10-CM | POA: Diagnosis not present

## 2017-08-06 DIAGNOSIS — M5416 Radiculopathy, lumbar region: Secondary | ICD-10-CM | POA: Diagnosis not present

## 2017-09-06 DIAGNOSIS — Z79891 Long term (current) use of opiate analgesic: Secondary | ICD-10-CM | POA: Diagnosis not present

## 2017-09-06 DIAGNOSIS — M5416 Radiculopathy, lumbar region: Secondary | ICD-10-CM | POA: Diagnosis not present

## 2017-09-06 DIAGNOSIS — G894 Chronic pain syndrome: Secondary | ICD-10-CM | POA: Diagnosis not present

## 2017-09-06 DIAGNOSIS — Z720 Tobacco use: Secondary | ICD-10-CM | POA: Diagnosis not present

## 2017-09-06 DIAGNOSIS — M549 Dorsalgia, unspecified: Secondary | ICD-10-CM | POA: Diagnosis not present

## 2017-09-06 DIAGNOSIS — Z981 Arthrodesis status: Secondary | ICD-10-CM | POA: Diagnosis not present

## 2017-09-06 DIAGNOSIS — M5136 Other intervertebral disc degeneration, lumbar region: Secondary | ICD-10-CM | POA: Diagnosis not present

## 2017-09-06 DIAGNOSIS — M961 Postlaminectomy syndrome, not elsewhere classified: Secondary | ICD-10-CM | POA: Diagnosis not present

## 2017-10-06 DIAGNOSIS — G894 Chronic pain syndrome: Secondary | ICD-10-CM | POA: Diagnosis not present

## 2017-10-06 DIAGNOSIS — M5416 Radiculopathy, lumbar region: Secondary | ICD-10-CM | POA: Diagnosis not present

## 2017-10-06 DIAGNOSIS — M549 Dorsalgia, unspecified: Secondary | ICD-10-CM | POA: Diagnosis not present

## 2017-10-06 DIAGNOSIS — Z720 Tobacco use: Secondary | ICD-10-CM | POA: Diagnosis not present

## 2017-10-06 DIAGNOSIS — M5136 Other intervertebral disc degeneration, lumbar region: Secondary | ICD-10-CM | POA: Diagnosis not present

## 2017-10-06 DIAGNOSIS — Z79891 Long term (current) use of opiate analgesic: Secondary | ICD-10-CM | POA: Diagnosis not present

## 2017-10-06 DIAGNOSIS — M961 Postlaminectomy syndrome, not elsewhere classified: Secondary | ICD-10-CM | POA: Diagnosis not present

## 2017-10-06 DIAGNOSIS — Z981 Arthrodesis status: Secondary | ICD-10-CM | POA: Diagnosis not present

## 2017-11-09 DIAGNOSIS — Z79891 Long term (current) use of opiate analgesic: Secondary | ICD-10-CM | POA: Diagnosis not present

## 2017-11-09 DIAGNOSIS — G894 Chronic pain syndrome: Secondary | ICD-10-CM | POA: Diagnosis not present

## 2017-11-09 DIAGNOSIS — M5416 Radiculopathy, lumbar region: Secondary | ICD-10-CM | POA: Diagnosis not present

## 2017-11-09 DIAGNOSIS — M961 Postlaminectomy syndrome, not elsewhere classified: Secondary | ICD-10-CM | POA: Diagnosis not present

## 2017-11-09 DIAGNOSIS — M5136 Other intervertebral disc degeneration, lumbar region: Secondary | ICD-10-CM | POA: Diagnosis not present

## 2017-11-09 DIAGNOSIS — M549 Dorsalgia, unspecified: Secondary | ICD-10-CM | POA: Diagnosis not present

## 2017-11-09 DIAGNOSIS — Z720 Tobacco use: Secondary | ICD-10-CM | POA: Diagnosis not present

## 2017-11-09 DIAGNOSIS — Z981 Arthrodesis status: Secondary | ICD-10-CM | POA: Diagnosis not present

## 2017-12-14 DIAGNOSIS — Z720 Tobacco use: Secondary | ICD-10-CM | POA: Diagnosis not present

## 2017-12-14 DIAGNOSIS — M5416 Radiculopathy, lumbar region: Secondary | ICD-10-CM | POA: Diagnosis not present

## 2017-12-14 DIAGNOSIS — M5136 Other intervertebral disc degeneration, lumbar region: Secondary | ICD-10-CM | POA: Diagnosis not present

## 2017-12-14 DIAGNOSIS — M961 Postlaminectomy syndrome, not elsewhere classified: Secondary | ICD-10-CM | POA: Diagnosis not present

## 2017-12-14 DIAGNOSIS — Z79891 Long term (current) use of opiate analgesic: Secondary | ICD-10-CM | POA: Diagnosis not present

## 2017-12-14 DIAGNOSIS — M549 Dorsalgia, unspecified: Secondary | ICD-10-CM | POA: Diagnosis not present

## 2017-12-14 DIAGNOSIS — G894 Chronic pain syndrome: Secondary | ICD-10-CM | POA: Diagnosis not present

## 2017-12-14 DIAGNOSIS — Z981 Arthrodesis status: Secondary | ICD-10-CM | POA: Diagnosis not present

## 2017-12-22 DIAGNOSIS — M4804 Spinal stenosis, thoracic region: Secondary | ICD-10-CM | POA: Diagnosis not present

## 2017-12-22 DIAGNOSIS — E782 Mixed hyperlipidemia: Secondary | ICD-10-CM | POA: Diagnosis not present

## 2017-12-22 DIAGNOSIS — Z23 Encounter for immunization: Secondary | ICD-10-CM | POA: Diagnosis not present

## 2017-12-22 DIAGNOSIS — N401 Enlarged prostate with lower urinary tract symptoms: Secondary | ICD-10-CM | POA: Diagnosis not present

## 2017-12-22 DIAGNOSIS — M545 Low back pain: Secondary | ICD-10-CM | POA: Diagnosis not present

## 2017-12-22 DIAGNOSIS — K210 Gastro-esophageal reflux disease with esophagitis: Secondary | ICD-10-CM | POA: Diagnosis not present

## 2017-12-22 DIAGNOSIS — J432 Centrilobular emphysema: Secondary | ICD-10-CM | POA: Diagnosis not present

## 2017-12-22 DIAGNOSIS — F17203 Nicotine dependence unspecified, with withdrawal: Secondary | ICD-10-CM | POA: Diagnosis not present

## 2017-12-22 DIAGNOSIS — Z6824 Body mass index (BMI) 24.0-24.9, adult: Secondary | ICD-10-CM | POA: Diagnosis not present

## 2018-01-07 DIAGNOSIS — G894 Chronic pain syndrome: Secondary | ICD-10-CM | POA: Diagnosis not present

## 2018-01-07 DIAGNOSIS — Z981 Arthrodesis status: Secondary | ICD-10-CM | POA: Diagnosis not present

## 2018-01-07 DIAGNOSIS — M5136 Other intervertebral disc degeneration, lumbar region: Secondary | ICD-10-CM | POA: Diagnosis not present

## 2018-01-07 DIAGNOSIS — Z79891 Long term (current) use of opiate analgesic: Secondary | ICD-10-CM | POA: Diagnosis not present

## 2018-01-07 DIAGNOSIS — M961 Postlaminectomy syndrome, not elsewhere classified: Secondary | ICD-10-CM | POA: Diagnosis not present

## 2018-01-07 DIAGNOSIS — M170 Bilateral primary osteoarthritis of knee: Secondary | ICD-10-CM | POA: Diagnosis not present

## 2018-01-07 DIAGNOSIS — M5416 Radiculopathy, lumbar region: Secondary | ICD-10-CM | POA: Diagnosis not present

## 2018-01-07 DIAGNOSIS — Z720 Tobacco use: Secondary | ICD-10-CM | POA: Diagnosis not present

## 2018-01-25 DIAGNOSIS — G8929 Other chronic pain: Secondary | ICD-10-CM | POA: Diagnosis not present

## 2018-01-25 DIAGNOSIS — M545 Low back pain: Secondary | ICD-10-CM | POA: Diagnosis not present

## 2018-02-04 DIAGNOSIS — M170 Bilateral primary osteoarthritis of knee: Secondary | ICD-10-CM | POA: Diagnosis not present

## 2018-02-04 DIAGNOSIS — Z981 Arthrodesis status: Secondary | ICD-10-CM | POA: Diagnosis not present

## 2018-02-04 DIAGNOSIS — M961 Postlaminectomy syndrome, not elsewhere classified: Secondary | ICD-10-CM | POA: Diagnosis not present

## 2018-02-04 DIAGNOSIS — Z79891 Long term (current) use of opiate analgesic: Secondary | ICD-10-CM | POA: Diagnosis not present

## 2018-02-04 DIAGNOSIS — G894 Chronic pain syndrome: Secondary | ICD-10-CM | POA: Diagnosis not present

## 2018-02-04 DIAGNOSIS — M5416 Radiculopathy, lumbar region: Secondary | ICD-10-CM | POA: Diagnosis not present

## 2018-02-04 DIAGNOSIS — M5136 Other intervertebral disc degeneration, lumbar region: Secondary | ICD-10-CM | POA: Diagnosis not present

## 2018-02-04 DIAGNOSIS — Z720 Tobacco use: Secondary | ICD-10-CM | POA: Diagnosis not present

## 2018-02-08 ENCOUNTER — Other Ambulatory Visit: Payer: Self-pay | Admitting: Neurosurgery

## 2018-02-08 DIAGNOSIS — M961 Postlaminectomy syndrome, not elsewhere classified: Secondary | ICD-10-CM

## 2018-02-08 DIAGNOSIS — M5136 Other intervertebral disc degeneration, lumbar region: Secondary | ICD-10-CM

## 2018-02-23 ENCOUNTER — Ambulatory Visit
Admission: RE | Admit: 2018-02-23 | Discharge: 2018-02-23 | Disposition: A | Discharge status: Home / Self Care | Payer: Medicare HMO | Source: Ambulatory Visit | Attending: Neurosurgery | Admitting: Neurosurgery

## 2018-02-23 DIAGNOSIS — M5136 Other intervertebral disc degeneration, lumbar region: Secondary | ICD-10-CM

## 2018-02-23 DIAGNOSIS — M47816 Spondylosis without myelopathy or radiculopathy, lumbar region: Secondary | ICD-10-CM | POA: Diagnosis not present

## 2018-02-23 DIAGNOSIS — M961 Postlaminectomy syndrome, not elsewhere classified: Secondary | ICD-10-CM

## 2018-02-23 DIAGNOSIS — M4316 Spondylolisthesis, lumbar region: Secondary | ICD-10-CM | POA: Diagnosis not present

## 2018-02-23 MED ORDER — GADOBENATE DIMEGLUMINE 529 MG/ML IV SOLN
19.0000 mL | Freq: Once | INTRAVENOUS | Status: AC | PRN
  Administered 2018-02-23: 19 mL via INTRAVENOUS

## 2018-03-08 DIAGNOSIS — Z79891 Long term (current) use of opiate analgesic: Secondary | ICD-10-CM | POA: Diagnosis not present

## 2018-03-08 DIAGNOSIS — Z981 Arthrodesis status: Secondary | ICD-10-CM | POA: Diagnosis not present

## 2018-03-08 DIAGNOSIS — M5136 Other intervertebral disc degeneration, lumbar region: Secondary | ICD-10-CM | POA: Diagnosis not present

## 2018-03-08 DIAGNOSIS — M170 Bilateral primary osteoarthritis of knee: Secondary | ICD-10-CM | POA: Diagnosis not present

## 2018-03-08 DIAGNOSIS — M25512 Pain in left shoulder: Secondary | ICD-10-CM | POA: Diagnosis not present

## 2018-03-08 DIAGNOSIS — M5416 Radiculopathy, lumbar region: Secondary | ICD-10-CM | POA: Diagnosis not present

## 2018-03-08 DIAGNOSIS — Z720 Tobacco use: Secondary | ICD-10-CM | POA: Diagnosis not present

## 2018-03-08 DIAGNOSIS — M961 Postlaminectomy syndrome, not elsewhere classified: Secondary | ICD-10-CM | POA: Diagnosis not present

## 2018-03-08 DIAGNOSIS — G894 Chronic pain syndrome: Secondary | ICD-10-CM | POA: Diagnosis not present

## 2018-04-04 DIAGNOSIS — M25511 Pain in right shoulder: Secondary | ICD-10-CM | POA: Diagnosis not present

## 2018-04-04 DIAGNOSIS — M19012 Primary osteoarthritis, left shoulder: Secondary | ICD-10-CM | POA: Diagnosis not present

## 2018-04-04 DIAGNOSIS — M5136 Other intervertebral disc degeneration, lumbar region: Secondary | ICD-10-CM | POA: Diagnosis not present

## 2018-04-14 DIAGNOSIS — R51 Headache: Secondary | ICD-10-CM | POA: Diagnosis not present

## 2018-04-25 DIAGNOSIS — R42 Dizziness and giddiness: Secondary | ICD-10-CM | POA: Diagnosis not present

## 2018-04-25 DIAGNOSIS — J329 Chronic sinusitis, unspecified: Secondary | ICD-10-CM | POA: Diagnosis not present

## 2018-04-25 DIAGNOSIS — R51 Headache: Secondary | ICD-10-CM | POA: Diagnosis not present

## 2018-05-05 DIAGNOSIS — M5136 Other intervertebral disc degeneration, lumbar region: Secondary | ICD-10-CM | POA: Diagnosis not present

## 2018-05-05 DIAGNOSIS — M5416 Radiculopathy, lumbar region: Secondary | ICD-10-CM | POA: Diagnosis not present

## 2018-05-05 DIAGNOSIS — G894 Chronic pain syndrome: Secondary | ICD-10-CM | POA: Diagnosis not present

## 2018-05-05 DIAGNOSIS — Z981 Arthrodesis status: Secondary | ICD-10-CM | POA: Diagnosis not present

## 2018-05-05 DIAGNOSIS — M19012 Primary osteoarthritis, left shoulder: Secondary | ICD-10-CM | POA: Diagnosis not present

## 2018-05-05 DIAGNOSIS — Z720 Tobacco use: Secondary | ICD-10-CM | POA: Diagnosis not present

## 2018-05-05 DIAGNOSIS — M170 Bilateral primary osteoarthritis of knee: Secondary | ICD-10-CM | POA: Diagnosis not present

## 2018-05-05 DIAGNOSIS — Z79891 Long term (current) use of opiate analgesic: Secondary | ICD-10-CM | POA: Diagnosis not present

## 2018-05-05 DIAGNOSIS — M961 Postlaminectomy syndrome, not elsewhere classified: Secondary | ICD-10-CM | POA: Diagnosis not present

## 2018-05-06 DIAGNOSIS — H9319 Tinnitus, unspecified ear: Secondary | ICD-10-CM | POA: Diagnosis not present

## 2018-05-06 DIAGNOSIS — R51 Headache: Secondary | ICD-10-CM | POA: Diagnosis not present

## 2018-05-06 DIAGNOSIS — J341 Cyst and mucocele of nose and nasal sinus: Secondary | ICD-10-CM | POA: Diagnosis not present

## 2018-05-06 DIAGNOSIS — J342 Deviated nasal septum: Secondary | ICD-10-CM | POA: Diagnosis not present

## 2018-05-23 DIAGNOSIS — R42 Dizziness and giddiness: Secondary | ICD-10-CM | POA: Diagnosis not present

## 2018-05-23 DIAGNOSIS — R51 Headache: Secondary | ICD-10-CM | POA: Diagnosis not present

## 2018-06-07 ENCOUNTER — Encounter: Payer: Self-pay | Admitting: Neurology

## 2018-06-08 DIAGNOSIS — M5136 Other intervertebral disc degeneration, lumbar region: Secondary | ICD-10-CM | POA: Diagnosis not present

## 2018-06-08 DIAGNOSIS — Z981 Arthrodesis status: Secondary | ICD-10-CM | POA: Diagnosis not present

## 2018-06-08 DIAGNOSIS — M961 Postlaminectomy syndrome, not elsewhere classified: Secondary | ICD-10-CM | POA: Diagnosis not present

## 2018-06-08 DIAGNOSIS — M170 Bilateral primary osteoarthritis of knee: Secondary | ICD-10-CM | POA: Diagnosis not present

## 2018-06-08 DIAGNOSIS — Z720 Tobacco use: Secondary | ICD-10-CM | POA: Diagnosis not present

## 2018-06-08 DIAGNOSIS — G894 Chronic pain syndrome: Secondary | ICD-10-CM | POA: Diagnosis not present

## 2018-06-08 DIAGNOSIS — M19012 Primary osteoarthritis, left shoulder: Secondary | ICD-10-CM | POA: Diagnosis not present

## 2018-06-08 DIAGNOSIS — M5416 Radiculopathy, lumbar region: Secondary | ICD-10-CM | POA: Diagnosis not present

## 2018-06-08 DIAGNOSIS — Z79891 Long term (current) use of opiate analgesic: Secondary | ICD-10-CM | POA: Diagnosis not present

## 2018-06-14 IMAGING — CR DG MYELOGRAPHY LUMBAR INJ LUMBOSACRAL
14 of 18 series · 14 of 18 positions shown · non-contrast
Comparison: Total myelogram 01/22/2015.

CLINICAL DATA: Low back pain. BILATERAL leg pain. Previous lumbar
fusion.
TECHNIQUE: Contiguous axial images were obtained through the Lumbar spine after
the intrathecal infusion of infusion. Coronal and sagittal
reconstructions were obtained of the axial image sets.

[vasc adipose (1 of 11)]
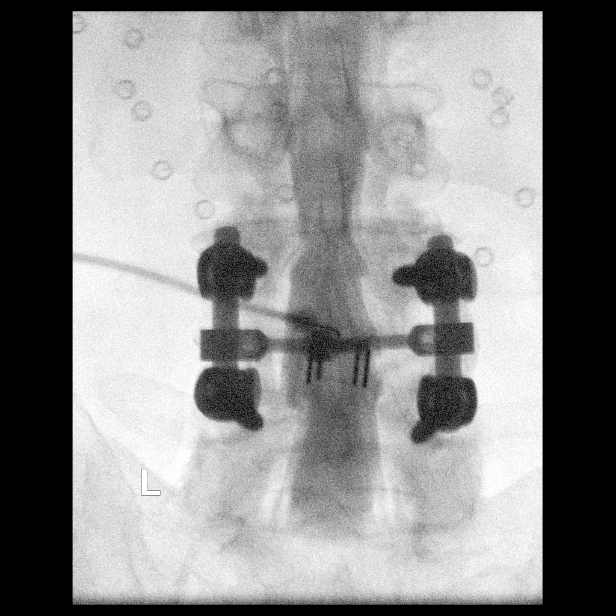

[w lumbar spine ap]
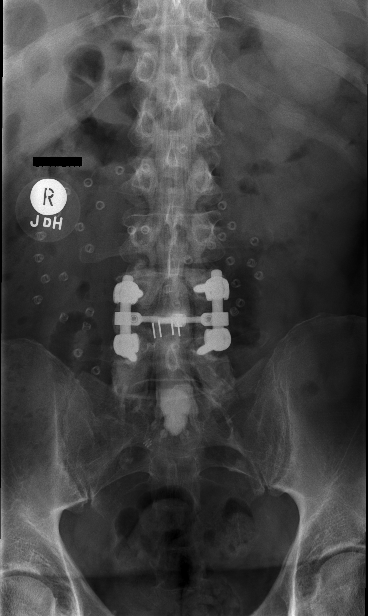

[vasc adipose (2 of 11)]
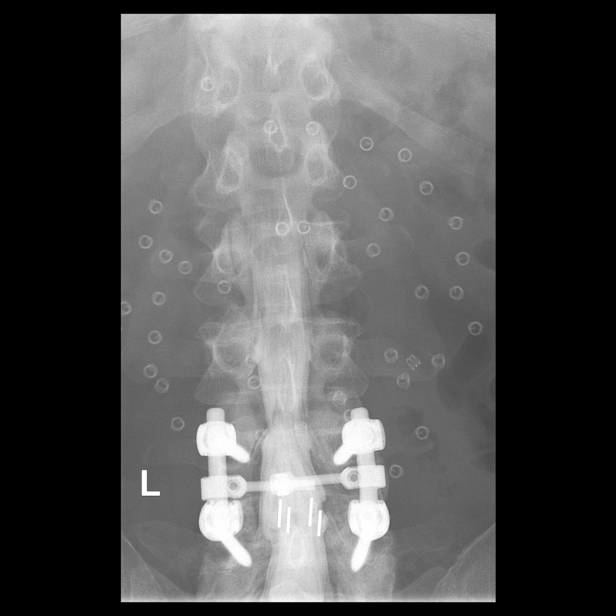

[w lumbar spine flexion]
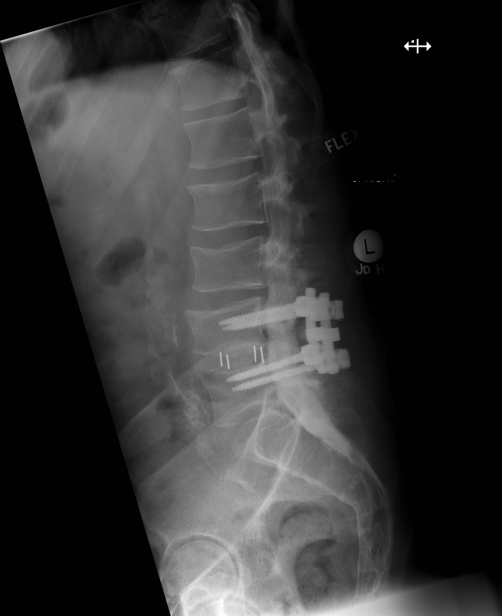

[vasc adipose (3 of 11)]
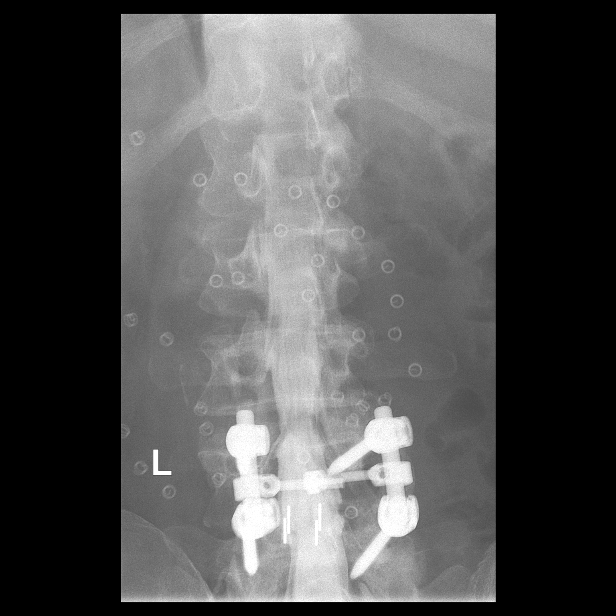

[w lumbar spine extension]
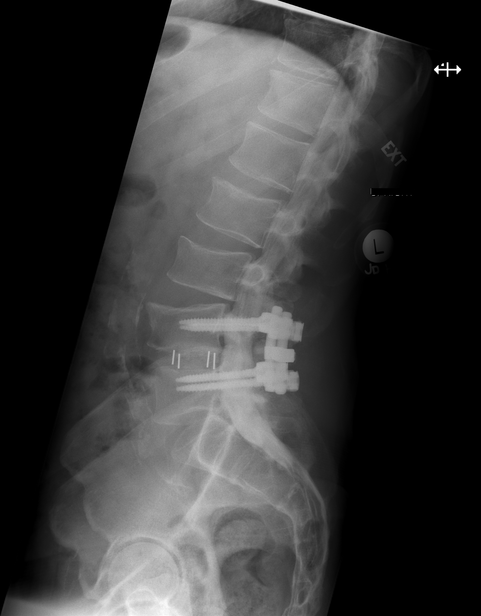

[vasc adipose (4 of 11)]
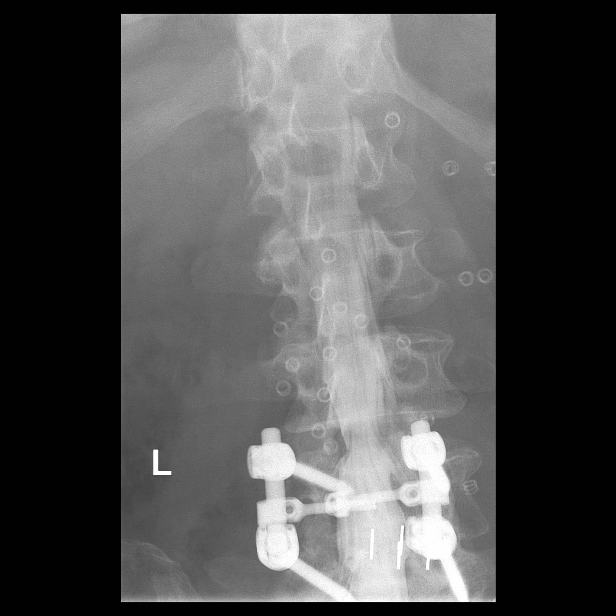

[vasc adipose (5 of 11)]
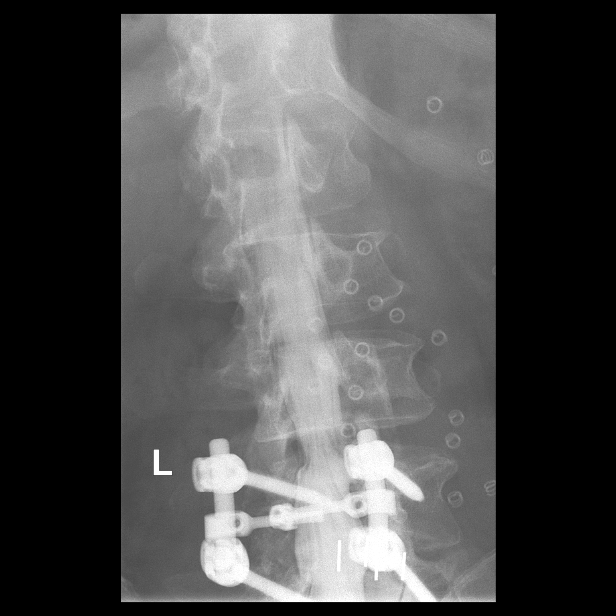

[vasc adipose (6 of 11)]
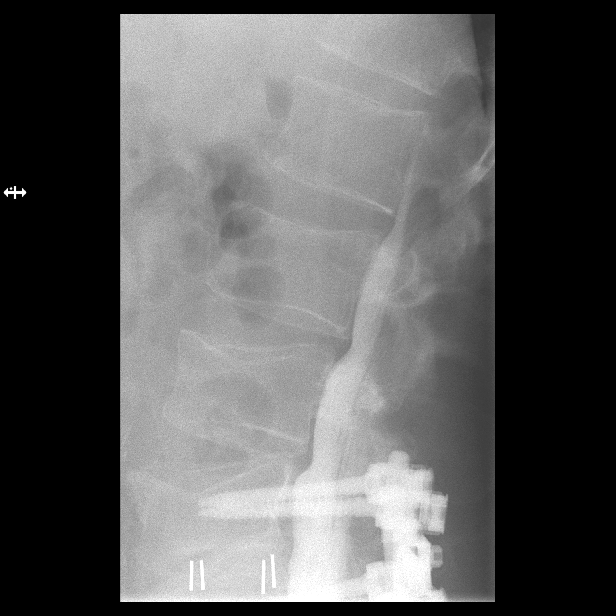

[vasc adipose (7 of 11)]
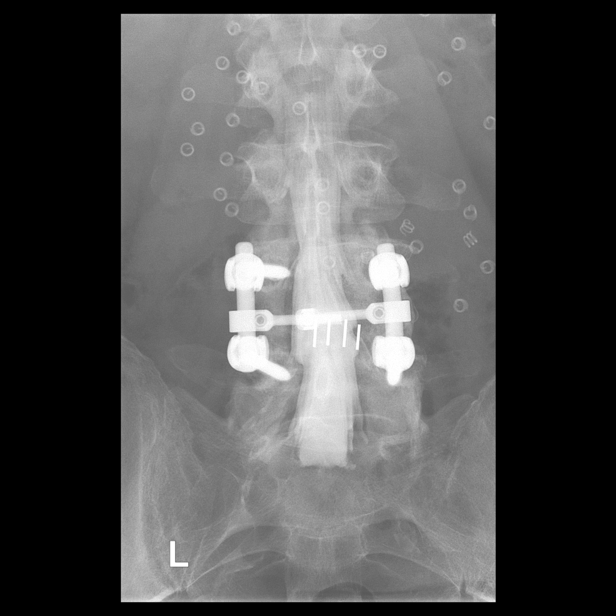

[vasc adipose (8 of 11)]
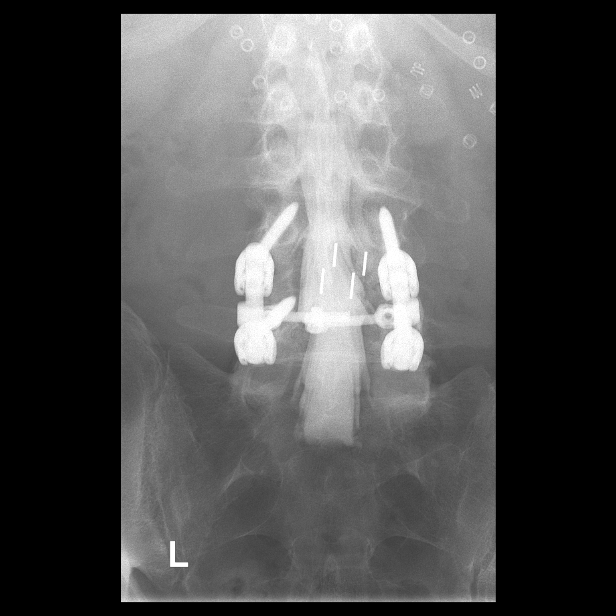

[vasc adipose (9 of 11)]
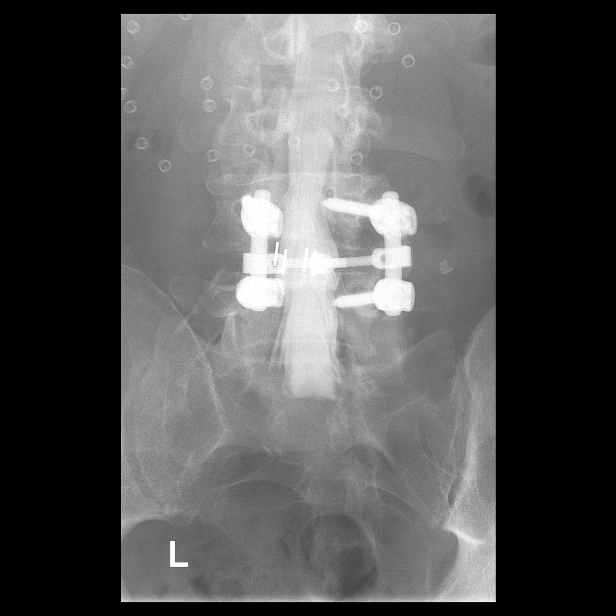

[vasc adipose (10 of 11)]
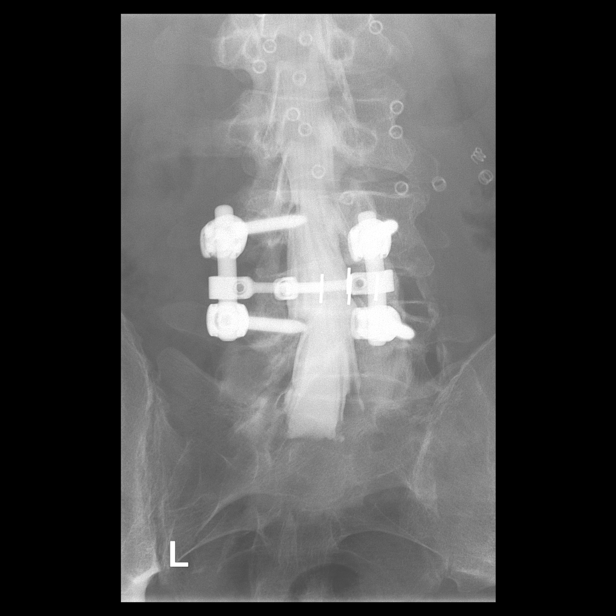

[vasc adipose (11 of 11)]
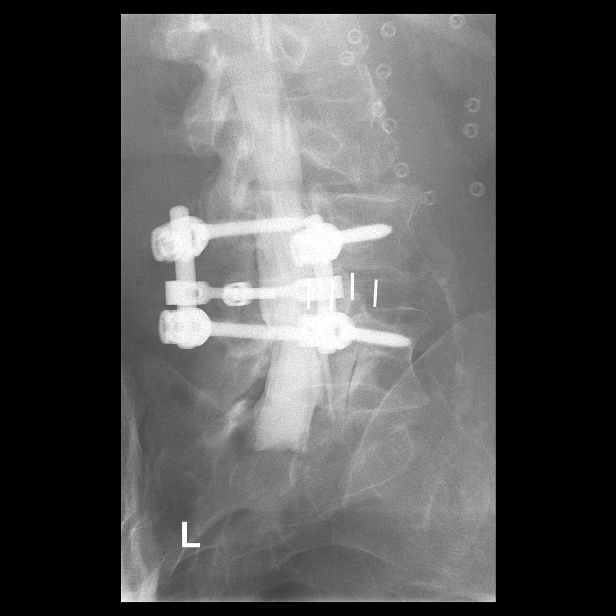

[14 of 18 positions shown; findings below may reference images not displayed]

EXAM:
LUMBAR MYELOGRAM

FLUOROSCOPY TIME:  34 seconds corresponding to a Dose Area Product
of 222.09?microGy*m2.17 spot films.

PROCEDURE:
After thorough discussion of risks and benefits of the procedure
including bleeding, infection, injury to nerves, blood vessels,
adjacent structures as well as headache and CSF leak, written and
oral informed consent was obtained. Consent was obtained by Dr. Letsu
Kotaro. Time out form was completed.

Patient was positioned prone on the fluoroscopy table. Local
anesthesia was provided with 1% lidocaine without epinephrine after
prepped and draped in the usual sterile fashion. Puncture was
performed at L4-5 using a 3 1/2 inch 22-gauge spinal needle via
midline approach. Using a single pass through the dura, the needle
was placed within the thecal sac, with return of clear CSF. 15 mL of
Isovue-M 200 was injected into the thecal sac, with normal
opacification of the nerve roots and cauda equina consistent with
free flow within the subarachnoid space.

I personally performed the lumbar puncture and administered the
intrathecal contrast. I also personally supervised acquisition of
the myelogram images.
FINDINGS: LUMBAR MYELOGRAM FINDINGS:

Good opacification lumbar subarachnoid space. Previous L4-5 PLIF.
Solid fusion. No hardware failure or loosening.

Moderate stenosis at L3-4 representing adjacent segment disease. 4
mm retrolisthesis at L3-4 with patient prone, in conjunction with
posterior element hypertrophy and bulging disc material, combine to
result in BILATERAL L4 nerve root cut off.

Annular bulging at L2-L3 does not result in significant stenosis.

With patient standing, there is mild dynamic instability. In upright
neutral, L3-4 retrolisthesis measures 5 mm. In extension it measures
4 mm. In flexion, it measures 2 mm.

There is no movement at L4-5 through flexion extension.

CT LUMBAR MYELOGRAM FINDINGS:

Segmentation: Normal.

Alignment: With the patient supine for CT, anterolisthesis at L3-4
measures 3 mm. Otherwise anatomic alignment.

Vertebrae: No worrisome osseous lesion.Minor superior endplate
deformities at L2, L3, and L4, indeterminate etiology, likely old
trauma.

Conus medullaris: Normal in size and location.

Paraspinal tissues: No evidence for hydronephrosis or paravertebral
mass. Advanced aortic atherosclerosis without aneurysmal dilatation.

Disc levels:

L1-L2:  Normal.

L2-L3:  Annular bulge.  No impingement.

L3-L4: 3 mm retrolisthesis. There is facet arthropathy and
ligamentum flavum hypertrophy. Annular bulging is noted centrally,
extending to both foramina. Mild central canal stenosis. BILATERAL
L4, and possibly BILATERAL L3 nerve root impingement.

L4-L5:  Solid fusion.  No impingement.

L5-S1:  Unremarkable.
IMPRESSION: LUMBAR MYELOGRAM IMPRESSION:

Solid L4-5 fusion.

Mild adjacent segment disease at L3-4, with mild dynamic instability
affecting both L4 nerve roots.

CT LUMBAR MYELOGRAM IMPRESSION:

Solid L4-5 arthrodesis.

Adjacent segment disease L3-4 consists of 3 mm retrolisthesis,
posterior element hypertrophy, and annular bulging. BILATERAL L4,
and possibly BILATERAL L3 nerve root impingement are observed.

Aortic atherosclerosis.

## 2018-07-06 DIAGNOSIS — Z0000 Encounter for general adult medical examination without abnormal findings: Secondary | ICD-10-CM | POA: Diagnosis not present

## 2018-07-11 DIAGNOSIS — M170 Bilateral primary osteoarthritis of knee: Secondary | ICD-10-CM | POA: Diagnosis not present

## 2018-07-11 DIAGNOSIS — M5136 Other intervertebral disc degeneration, lumbar region: Secondary | ICD-10-CM | POA: Diagnosis not present

## 2018-07-11 DIAGNOSIS — Z981 Arthrodesis status: Secondary | ICD-10-CM | POA: Diagnosis not present

## 2018-07-11 DIAGNOSIS — Z79891 Long term (current) use of opiate analgesic: Secondary | ICD-10-CM | POA: Diagnosis not present

## 2018-07-11 DIAGNOSIS — Z720 Tobacco use: Secondary | ICD-10-CM | POA: Diagnosis not present

## 2018-07-11 DIAGNOSIS — M5416 Radiculopathy, lumbar region: Secondary | ICD-10-CM | POA: Diagnosis not present

## 2018-07-11 DIAGNOSIS — G894 Chronic pain syndrome: Secondary | ICD-10-CM | POA: Diagnosis not present

## 2018-07-11 DIAGNOSIS — M961 Postlaminectomy syndrome, not elsewhere classified: Secondary | ICD-10-CM | POA: Diagnosis not present

## 2018-07-11 DIAGNOSIS — M19012 Primary osteoarthritis, left shoulder: Secondary | ICD-10-CM | POA: Diagnosis not present

## 2018-08-11 ENCOUNTER — Ambulatory Visit: Payer: Medicare HMO | Admitting: Neurology

## 2018-08-11 DIAGNOSIS — Z79891 Long term (current) use of opiate analgesic: Secondary | ICD-10-CM | POA: Diagnosis not present

## 2018-08-11 DIAGNOSIS — M5136 Other intervertebral disc degeneration, lumbar region: Secondary | ICD-10-CM | POA: Diagnosis not present

## 2018-08-11 DIAGNOSIS — M961 Postlaminectomy syndrome, not elsewhere classified: Secondary | ICD-10-CM | POA: Diagnosis not present

## 2018-08-11 DIAGNOSIS — M170 Bilateral primary osteoarthritis of knee: Secondary | ICD-10-CM | POA: Diagnosis not present

## 2018-08-11 DIAGNOSIS — G894 Chronic pain syndrome: Secondary | ICD-10-CM | POA: Diagnosis not present

## 2018-08-11 DIAGNOSIS — M19012 Primary osteoarthritis, left shoulder: Secondary | ICD-10-CM | POA: Diagnosis not present

## 2018-08-11 DIAGNOSIS — Z720 Tobacco use: Secondary | ICD-10-CM | POA: Diagnosis not present

## 2018-08-11 DIAGNOSIS — Z981 Arthrodesis status: Secondary | ICD-10-CM | POA: Diagnosis not present

## 2018-08-11 DIAGNOSIS — M5416 Radiculopathy, lumbar region: Secondary | ICD-10-CM | POA: Diagnosis not present

## 2018-08-11 NOTE — Progress Notes (Signed)
Virtual Visit via Video Note The purpose of this virtual visit is to provide medical care while limiting exposure to the novel coronavirus.    Consent was obtained for video visit:  Yes Answered questions that patient had about telehealth interaction:  Yes I discussed the limitations, risks, security and privacy concerns of performing an evaluation and management service by telemedicine. I also discussed with the patient that there may be a patient responsible charge related to this service. The patient expressed understanding and agreed to proceed.  Pt location: Home Physician Location: Home Name of referring provider:  Lillard Anes,* I connected with Clarence Davis at patients initiation/request on 08/12/2018 at  9:00 AM EDT by video enabled telemedicine application and verified that I am speaking with the correct person using two identifiers. Pt MRN:  [id number] Pt DOB:  11/25/1960 Video Participants:  Clarence Davis   History of Present Illness:  Clarence Davis is a 58 year old man with chronic back pain, depression, anxiety and history of C4-C7 ACDF and L4-5 fusion and alcoholism in remission for many years who presents for headache and recurrent dizziness.  History supplemented by referring provider note.  In January, he began experiencing dizziness and headache.  It was gradual onset.  No preceding viral illness or head trauma.  The dizziness was described as spinning sensation associated with darkening of vision.  There was no associated nausea, vomiting, slurred speech or unilateral numbness or weakness.  It mostly is positional, occurring when getting up or moving, however he states it sometimes may occur spontaneously.  It lasts 30 to 60 seconds and occurs a couple of times a day.  He would often need to grab the counter to keep his balance.  He notes bilateral ringing in his ears but denies hearing loss or aural fullness.  Headaches occur alone or sometimes in conjunction  with the dizzy spells.  They are moderate to severe throbbing or pressure-like ache of moderate to severe intensity.  He notes some photosensitivity when he goes outside in the sun.  There is no associated nausea, vomiting, phonophobia, visual disturbance or unilateral numbness or weakness.  It typically lasts 30 to 60 minutes and occurs 2 to 4 times a week.  No specific triggers.  Heating pad may help.  He does have chronic neck pain with past C4-C7 ACDF.  He had a CT of the brain performed on 04/25/18 which as per report was unremarkable except for some paranasal sinus disease and retention cyst in left maxillary sinus.  He was evaluated by ENT with negative workup.    He denies prior history of headaches or vertigo.  Current NSAIDs:  ASA Current analgesics:  Hydrocodone (back pain) Current muscle relaxant:  Flexeril 10mg  Current antidepressant:  Citalopram 20mg  Antihistamine:  Meclizine  Past analgesics:  Tylenol, Excedrin  Caffeine:  2-3 cups of coffee daily Sleep hygiene:  varies  Past Medical History: Past Medical History:  Diagnosis Date   Alcoholic ([place])    quit drinking many yrs ago per pt   Anxiety    Arthritis    "hands" (04/05/2015)   Bilateral carpal tunnel syndrome    Chronic back pain    "all over"   Chronic diarrhea    Depression    takes Citalopram daily   Dry skin    Dysrhythmia    PVC's    GERD (gastroesophageal reflux disease)    takes Omeprazole daily   Head injury    early 90's   Headache    "  weekly" (04/05/2015)   Hepatitis C    "treated w/RX; don't have it anymore" (04/05/2015)    Medications: Outpatient Encounter Medications as of 08/12/2018  Medication Sig   citalopram (CELEXA) 20 MG tablet Take 20 mg by mouth at bedtime.    cyclobenzaprine (FLEXERIL) 10 MG tablet Take 10 mg by mouth 3 (three) times daily as needed for muscle spasms.    Multiple Vitamin (MULTIVITAMIN WITH MINERALS) TABS tablet Take 1 tablet by mouth daily.    omeprazole (PRILOSEC) 40 MG capsule Take 40 mg by mouth daily.   Oxycodone HCl 10 MG TABS Take 10 mg by mouth every 6 (six) hours as needed (severe pain).    No facility-administered encounter medications on file as of 08/12/2018.     Allergies: Allergies  Allergen Reactions   Tramadol Rash and Other (See Comments)    dizzy    Family History: Family History  Problem Relation Age of Onset   Heart disease Sister     Social History: Social History   Socioeconomic History   Marital status: Significant Other    Spouse name: Not on file   Number of children: Not on file   Years of education: Not on file   Highest education level: Not on file  Occupational History   Not on file  Social Needs   Financial resource strain: Not on file   Food insecurity:    Worry: Not on file    Inability: Not on file   Transportation needs:    Medical: Not on file    Non-medical: Not on file  Tobacco Use   Smoking status: Current Some Day Smoker    Packs/day: 0.15    Years: 28.00    Pack years: 4.20    Types: E-cigarettes, Cigarettes    Last attempt to quit: 02/25/2015    Years since quitting: 3.4   Smokeless tobacco: Never Used  Substance and Sexual Activity   Alcohol use: Yes    Comment: 04/05/2015 "recovering alcoholic; last drink 16/06/8464"   Drug use: No   Sexual activity: Not Currently  Lifestyle   Physical activity:    Days per week: Not on file    Minutes per session: Not on file   Stress: Not on file  Relationships   Social connections:    Talks on phone: Not on file    Gets together: Not on file    Attends religious service: Not on file    Active member of club or organization: Not on file    Attends meetings of clubs or organizations: Not on file    Relationship status: Not on file   Intimate partner violence:    Fear of current or ex partner: Not on file    Emotionally abused: Not on file    Physically abused: Not on file    Forced sexual  activity: Not on file  Other Topics Concern   Not on file  Social History Narrative   Not on file   Observations/Objective:   Height 5' 10.5" (1.791 m), weight 193 lb (87.5 kg). Alert and oriented.  Speech fluent and not dysarthric.  Language intact.  Eyes orthophoric on primary gaze and move in all directions.  Face symmetric.  Assessment and Plan:   1.  Vertigo. Semiology sounds consistent with a peripheral vertigo such as BPPV.  MRI of brain does not demonstrate any secondary intracranial etiology for dizziness. 2.  Headache, possibly tension-type headache, not intractable, possibly aggravated by chronic neck pain.  Again,  MRI of brain does not reveal any secondary etiology for headache. 3.  Tinnitus.  MRI did not reveal secondary etiology.  Evaluated by ENT.  1.  Start nortriptyline 10mg  at bedtime.  We can increase dose to 25mg  at bedtime in 4 weeks if needed. 2.  Advised to discontinue coffee as frequent use of caffeine may aggravate both headache and tinnitus. 3.  Limit use of pain relievers to no more than 2 days out of week to prevent risk of rebound or medication-overuse headache. 4.  Keep headache diary 5.  At this time, he defers referral to vestibular rehab 6.  Follow up in 4 months.  Follow Up Instructions:    -I discussed the assessment and treatment plan with the patient. The patient was provided an opportunity to ask questions and all were answered. The patient agreed with the plan and demonstrated an understanding of the instructions.   The patient was advised to call back or seek an in-person evaluation if the symptoms worsen or if the condition fails to improve as anticipated.   Dudley Major, DO

## 2018-08-12 ENCOUNTER — Other Ambulatory Visit: Payer: Self-pay

## 2018-08-12 ENCOUNTER — Telehealth (INDEPENDENT_AMBULATORY_CARE_PROVIDER_SITE_OTHER): Payer: Medicare HMO | Admitting: Neurology

## 2018-08-12 ENCOUNTER — Encounter: Payer: Self-pay | Admitting: Neurology

## 2018-08-12 VITALS — Ht 70.5 in | Wt 193.0 lb

## 2018-08-12 DIAGNOSIS — G44229 Chronic tension-type headache, not intractable: Secondary | ICD-10-CM

## 2018-08-12 DIAGNOSIS — H81399 Other peripheral vertigo, unspecified ear: Secondary | ICD-10-CM

## 2018-08-12 DIAGNOSIS — H9313 Tinnitus, bilateral: Secondary | ICD-10-CM

## 2018-08-12 MED ORDER — NORTRIPTYLINE HCL 10 MG PO CAPS: 10.0000 mg | ORAL_CAPSULE | Freq: Every day | ORAL | 3 refills | Status: DC

## 2018-08-12 NOTE — Progress Notes (Signed)
Faxed request to [facility] med record department for MRI brain report to f# [phone]

## 2018-09-08 DIAGNOSIS — M5136 Other intervertebral disc degeneration, lumbar region: Secondary | ICD-10-CM | POA: Diagnosis not present

## 2018-09-08 DIAGNOSIS — Z720 Tobacco use: Secondary | ICD-10-CM | POA: Diagnosis not present

## 2018-09-08 DIAGNOSIS — M19012 Primary osteoarthritis, left shoulder: Secondary | ICD-10-CM | POA: Diagnosis not present

## 2018-09-08 DIAGNOSIS — G894 Chronic pain syndrome: Secondary | ICD-10-CM | POA: Diagnosis not present

## 2018-09-08 DIAGNOSIS — M961 Postlaminectomy syndrome, not elsewhere classified: Secondary | ICD-10-CM | POA: Diagnosis not present

## 2018-09-08 DIAGNOSIS — M170 Bilateral primary osteoarthritis of knee: Secondary | ICD-10-CM | POA: Diagnosis not present

## 2018-09-08 DIAGNOSIS — M5416 Radiculopathy, lumbar region: Secondary | ICD-10-CM | POA: Diagnosis not present

## 2018-09-08 DIAGNOSIS — Z981 Arthrodesis status: Secondary | ICD-10-CM | POA: Diagnosis not present

## 2018-09-08 DIAGNOSIS — Z79891 Long term (current) use of opiate analgesic: Secondary | ICD-10-CM | POA: Diagnosis not present

## 2018-09-22 DIAGNOSIS — J432 Centrilobular emphysema: Secondary | ICD-10-CM | POA: Diagnosis not present

## 2018-09-22 DIAGNOSIS — N401 Enlarged prostate with lower urinary tract symptoms: Secondary | ICD-10-CM | POA: Diagnosis not present

## 2018-09-22 DIAGNOSIS — Z6824 Body mass index (BMI) 24.0-24.9, adult: Secondary | ICD-10-CM | POA: Diagnosis not present

## 2018-09-22 DIAGNOSIS — E782 Mixed hyperlipidemia: Secondary | ICD-10-CM | POA: Diagnosis not present

## 2018-09-22 DIAGNOSIS — M5003 Cervical disc disorder with myelopathy, cervicothoracic region: Secondary | ICD-10-CM | POA: Diagnosis not present

## 2018-09-22 DIAGNOSIS — F17203 Nicotine dependence unspecified, with withdrawal: Secondary | ICD-10-CM | POA: Diagnosis not present

## 2018-10-06 DIAGNOSIS — Z79891 Long term (current) use of opiate analgesic: Secondary | ICD-10-CM | POA: Diagnosis not present

## 2018-10-06 DIAGNOSIS — G894 Chronic pain syndrome: Secondary | ICD-10-CM | POA: Diagnosis not present

## 2018-10-06 DIAGNOSIS — M19012 Primary osteoarthritis, left shoulder: Secondary | ICD-10-CM | POA: Diagnosis not present

## 2018-10-06 DIAGNOSIS — M170 Bilateral primary osteoarthritis of knee: Secondary | ICD-10-CM | POA: Diagnosis not present

## 2018-10-06 DIAGNOSIS — Z981 Arthrodesis status: Secondary | ICD-10-CM | POA: Diagnosis not present

## 2018-10-06 DIAGNOSIS — M5136 Other intervertebral disc degeneration, lumbar region: Secondary | ICD-10-CM | POA: Diagnosis not present

## 2018-10-06 DIAGNOSIS — M961 Postlaminectomy syndrome, not elsewhere classified: Secondary | ICD-10-CM | POA: Diagnosis not present

## 2018-10-06 DIAGNOSIS — Z1389 Encounter for screening for other disorder: Secondary | ICD-10-CM | POA: Diagnosis not present

## 2018-10-06 DIAGNOSIS — M5416 Radiculopathy, lumbar region: Secondary | ICD-10-CM | POA: Diagnosis not present

## 2018-11-10 DIAGNOSIS — M961 Postlaminectomy syndrome, not elsewhere classified: Secondary | ICD-10-CM | POA: Diagnosis not present

## 2018-11-10 DIAGNOSIS — M5416 Radiculopathy, lumbar region: Secondary | ICD-10-CM | POA: Diagnosis not present

## 2018-11-10 DIAGNOSIS — Z981 Arthrodesis status: Secondary | ICD-10-CM | POA: Diagnosis not present

## 2018-11-10 DIAGNOSIS — Z79891 Long term (current) use of opiate analgesic: Secondary | ICD-10-CM | POA: Diagnosis not present

## 2018-11-10 DIAGNOSIS — M4724 Other spondylosis with radiculopathy, thoracic region: Secondary | ICD-10-CM | POA: Diagnosis not present

## 2018-11-10 DIAGNOSIS — Z1389 Encounter for screening for other disorder: Secondary | ICD-10-CM | POA: Diagnosis not present

## 2018-11-10 DIAGNOSIS — G894 Chronic pain syndrome: Secondary | ICD-10-CM | POA: Diagnosis not present

## 2018-11-10 DIAGNOSIS — M19012 Primary osteoarthritis, left shoulder: Secondary | ICD-10-CM | POA: Diagnosis not present

## 2018-11-10 DIAGNOSIS — M5136 Other intervertebral disc degeneration, lumbar region: Secondary | ICD-10-CM | POA: Diagnosis not present

## 2018-12-08 DIAGNOSIS — G894 Chronic pain syndrome: Secondary | ICD-10-CM | POA: Diagnosis not present

## 2018-12-08 DIAGNOSIS — M961 Postlaminectomy syndrome, not elsewhere classified: Secondary | ICD-10-CM | POA: Diagnosis not present

## 2018-12-08 DIAGNOSIS — Z981 Arthrodesis status: Secondary | ICD-10-CM | POA: Diagnosis not present

## 2018-12-08 DIAGNOSIS — Z1389 Encounter for screening for other disorder: Secondary | ICD-10-CM | POA: Diagnosis not present

## 2018-12-08 DIAGNOSIS — Z79891 Long term (current) use of opiate analgesic: Secondary | ICD-10-CM | POA: Diagnosis not present

## 2018-12-08 DIAGNOSIS — M19012 Primary osteoarthritis, left shoulder: Secondary | ICD-10-CM | POA: Diagnosis not present

## 2018-12-08 DIAGNOSIS — M5136 Other intervertebral disc degeneration, lumbar region: Secondary | ICD-10-CM | POA: Diagnosis not present

## 2018-12-08 DIAGNOSIS — M4724 Other spondylosis with radiculopathy, thoracic region: Secondary | ICD-10-CM | POA: Diagnosis not present

## 2018-12-08 DIAGNOSIS — M5416 Radiculopathy, lumbar region: Secondary | ICD-10-CM | POA: Diagnosis not present

## 2018-12-10 ENCOUNTER — Other Ambulatory Visit: Payer: Self-pay | Admitting: Neurology

## 2018-12-13 NOTE — Progress Notes (Deleted)
NEUROLOGY FOLLOW UP OFFICE NOTE  Clarence Davis [id number]  HISTORY OF PRESENT ILLNESS: Clarence Davis is a 58 year old man with chronic back pain, depression, anxiety and history of C4-C7 ACDF and L4-5 fusion and alcoholism in remission for many years who follows up for vertigo and headache.  UPDATE: ***  Current NSAIDs:  ASA Current analgesics:  Hydrocodone (back pain) Current muscle relaxant:  Flexeril 10mg  Current antidepressant:  Nortriptyline 10mg ; citalopram 20mg  Antihistamine:  Meclizine  HISTORY: In January 2020, he began experiencing dizziness and headache.  It was gradual onset.  No preceding viral illness or head trauma.  The dizziness was described as spinning sensation associated with darkening of vision.  There was no associated nausea, vomiting, slurred speech or unilateral numbness or weakness.  It mostly is positional, occurring when getting up or moving, however he states it sometimes may occur spontaneously.  It lasts 30 to 60 seconds and occurs a couple of times a day.  He would often need to grab the counter to keep his balance.  He notes bilateral ringing in his ears but denies hearing loss or aural fullness.  Headaches occur alone or sometimes in conjunction with the dizzy spells.  They are moderate to severe throbbing or pressure-like ache of moderate to severe intensity.  He notes some photosensitivity when he goes outside in the sun.  There is no associated nausea, vomiting, phonophobia, visual disturbance or unilateral numbness or weakness.  It typically lasts 30 to 60 minutes and occurs 2 to 4 times a week.  No specific triggers.  Heating pad may help.  He does have chronic neck pain with past C4-C7 ACDF.  He had a CT of the brain performed on 04/25/18 which as per report was unremarkable except for some paranasal sinus disease and retention cyst in left maxillary sinus.  He was evaluated by ENT with negative workup.    He denies prior history of headaches or  vertigo.  Current NSAIDs:  ASA Current analgesics:  Hydrocodone (back pain) Current muscle relaxant:  Flexeril 10mg  Current antidepressant:  Citalopram 20mg  Antihistamine:  Meclizine  Past analgesics:  Tylenol, Excedrin  Caffeine:  2-3 cups of coffee daily Sleep hygiene:  varies  PAST MEDICAL HISTORY: Past Medical History:  Diagnosis Date  . Alcoholic ([place])    quit drinking many yrs ago per pt  . Anxiety   . Arthritis    "hands" (04/05/2015)  . Bilateral carpal tunnel syndrome   . Chronic back pain    "all over"  . Chronic diarrhea   . Depression    takes Citalopram daily  . Dry skin   . Dysrhythmia    PVC's   . GERD (gastroesophageal reflux disease)    takes Omeprazole daily  . Head injury    early 38's  . Headache    "weekly" (04/05/2015)  . Hepatitis C    "treated w/RX; don't have it anymore" (04/05/2015)    MEDICATIONS: Current Outpatient Medications on File Prior to Visit  Medication Sig Dispense Refill  . citalopram (CELEXA) 20 MG tablet Take 20 mg by mouth at bedtime.   3  . cyclobenzaprine (FLEXERIL) 10 MG tablet Take 10 mg by mouth 3 (three) times daily as needed for muscle spasms.   3  . lidocaine (LIDODERM) 5 %     . Multiple Vitamin (MULTIVITAMIN WITH MINERALS) TABS tablet Take 1 tablet by mouth daily.    . nortriptyline (PAMELOR) 10 MG capsule Take 1 capsule (10 mg total) by mouth  at bedtime. 30 capsule 3  . omeprazole (PRILOSEC) 40 MG capsule Take 40 mg by mouth daily.  6  . Oxycodone HCl 10 MG TABS Take 10 mg by mouth every 6 (six) hours as needed (severe pain).   0  . tamsulosin (FLOMAX) 0.4 MG CAPS capsule Take 0.4 mg by mouth at bedtime.     No current facility-administered medications on file prior to visit.     ALLERGIES: Allergies  Allergen Reactions  . Tramadol Rash and Other (See Comments)    dizzy    FAMILY HISTORY: Family History  Problem Relation Age of Onset  . Throat cancer Father   . Heart disease Sister     SOCIAL  HISTORY: Social History   Socioeconomic History  . Marital status: Married    Spouse name: Not on file  . Number of children: 2  . Years of education: Not on file  . Highest education level: 12th grade  Occupational History  . Occupation: disabled  Social Needs  . Financial resource strain: Not on file  . Food insecurity    Worry: Not on file    Inability: Not on file  . Transportation needs    Medical: Not on file    Non-medical: Not on file  Tobacco Use  . Smoking status: Current Some Day Smoker    Packs/day: 0.15    Years: 28.00    Pack years: 4.20    Types: E-cigarettes, Cigarettes    Last attempt to quit: 02/25/2015    Years since quitting: 3.8  . Smokeless tobacco: Never Used  Substance and Sexual Activity  . Alcohol use: Yes    Comment: 04/05/2015 "recovering alcoholic; last drink [id number]"  . Drug use: No  . Sexual activity: Not Currently  Lifestyle  . Physical activity    Days per week: Not on file    Minutes per session: Not on file  . Stress: Not on file  Relationships  . Social Herbalist on phone: Not on file    Gets together: Not on file    Attends religious service: Not on file    Active member of club or organization: Not on file    Attends meetings of clubs or organizations: Not on file    Relationship status: Not on file  . Intimate partner violence    Fear of current or ex partner: Not on file    Emotionally abused: Not on file    Physically abused: Not on file    Forced sexual activity: Not on file  Other Topics Concern  . Not on file  Social History Narrative   Patient is left-handed. He lives with his long time girlfriend Clarence Davis in a one level home home. He drinks 3 cups of coffee and 4 Coca-Colas a day. He tries to walk when able.    REVIEW OF SYSTEMS: Constitutional: No fevers, chills, or sweats, no generalized fatigue, change in appetite Eyes: No visual changes, double vision, eye pain Ear, nose and throat: No  hearing loss, ear pain, nasal congestion, sore throat Cardiovascular: No chest pain, palpitations Respiratory:  No shortness of breath at rest or with exertion, wheezes GastrointestinaI: No nausea, vomiting, diarrhea, abdominal pain, fecal incontinence Genitourinary:  No dysuria, urinary retention or frequency Musculoskeletal:  No neck pain, back pain Integumentary: No rash, pruritus, skin lesions Neurological: as above Psychiatric: No depression, insomnia, anxiety Endocrine: No palpitations, fatigue, diaphoresis, mood swings, change in appetite, change in weight, increased thirst Hematologic/Lymphatic:  No purpura, petechiae. Allergic/Immunologic: no itchy/runny eyes, nasal congestion, recent allergic reactions, rashes  PHYSICAL EXAM: *** General: No acute distress.  Patient appears well-groomed.   Head:  Normocephalic/atraumatic Eyes:  Fundi examined but not visualized Neck: supple, no paraspinal tenderness, full range of motion Heart:  Regular rate and rhythm Lungs:  Clear to auscultation bilaterally Back: No paraspinal tenderness Neurological Exam: alert and oriented to person, place, and time. Attention span and concentration intact, recent and remote memory intact, fund of knowledge intact.  Speech fluent and not dysarthric, language intact.  CN II-XII intact. Bulk and tone normal, muscle strength 5/5 throughout.  Sensation to light touch  intact.  Deep tendon reflexes 2+ throughout.  Finger to nose testing intact.  Gait normal, Romberg negative.  IMPRESSION: 1.  Peri;pheral vertigo.  No clear neurologic etiology. 2.  Tension-type headache, not intractable, possibly aggravated by chronic neck pain 3.  Tinnitus, unclear etiology  PLAN: 1.  For preventative management, *** 2.  For abortive therapy, *** 3.  Limit use of pain relievers to no more than 2 days out of week to prevent risk of rebound or medication-overuse headache. 4.  Keep headache diary 5.  Exercise, hydration,  caffeine cessation, sleep hygiene, monitor for and avoid triggers 6.  Consider:  magnesium citrate 400mg  daily, riboflavin 400mg  daily, and coenzyme Q10 100mg  three times daily 7. Follow up ***  Metta Clines, DO  CC: Reinaldo Meeker, MD

## 2018-12-14 ENCOUNTER — Ambulatory Visit: Payer: Medicare HMO | Admitting: Neurology

## 2018-12-14 NOTE — Telephone Encounter (Signed)
Requested Prescriptions   Pending Prescriptions Disp Refills  . nortriptyline (PAMELOR) 10 MG capsule [Pharmacy Med Name: NORTRIPTYLINE 10MG  CAPSULES] 30 capsule 3    Sig: TAKE 1 CAPSULE(10 MG) BY MOUTH AT BEDTIME   Rx last filled:TODAY  Pt last seen: 08/12/18   Follow up appt scheduled:NONE  RX CAN BE REFILLED DURING APPT

## 2018-12-27 DIAGNOSIS — M4804 Spinal stenosis, thoracic region: Secondary | ICD-10-CM | POA: Diagnosis not present

## 2018-12-27 DIAGNOSIS — J441 Chronic obstructive pulmonary disease with (acute) exacerbation: Secondary | ICD-10-CM | POA: Diagnosis not present

## 2018-12-27 DIAGNOSIS — M5003 Cervical disc disorder with myelopathy, cervicothoracic region: Secondary | ICD-10-CM | POA: Diagnosis not present

## 2018-12-27 DIAGNOSIS — E782 Mixed hyperlipidemia: Secondary | ICD-10-CM | POA: Diagnosis not present

## 2019-01-05 DIAGNOSIS — M5136 Other intervertebral disc degeneration, lumbar region: Secondary | ICD-10-CM | POA: Diagnosis not present

## 2019-01-05 DIAGNOSIS — M4724 Other spondylosis with radiculopathy, thoracic region: Secondary | ICD-10-CM | POA: Diagnosis not present

## 2019-01-05 DIAGNOSIS — Z981 Arthrodesis status: Secondary | ICD-10-CM | POA: Diagnosis not present

## 2019-01-05 DIAGNOSIS — M19012 Primary osteoarthritis, left shoulder: Secondary | ICD-10-CM | POA: Diagnosis not present

## 2019-01-05 DIAGNOSIS — R05 Cough: Secondary | ICD-10-CM | POA: Diagnosis not present

## 2019-01-05 DIAGNOSIS — M5416 Radiculopathy, lumbar region: Secondary | ICD-10-CM | POA: Diagnosis not present

## 2019-01-05 DIAGNOSIS — J441 Chronic obstructive pulmonary disease with (acute) exacerbation: Secondary | ICD-10-CM | POA: Diagnosis not present

## 2019-01-05 DIAGNOSIS — R0602 Shortness of breath: Secondary | ICD-10-CM | POA: Diagnosis not present

## 2019-01-05 DIAGNOSIS — Z1389 Encounter for screening for other disorder: Secondary | ICD-10-CM | POA: Diagnosis not present

## 2019-01-05 DIAGNOSIS — M961 Postlaminectomy syndrome, not elsewhere classified: Secondary | ICD-10-CM | POA: Diagnosis not present

## 2019-01-05 DIAGNOSIS — Z79891 Long term (current) use of opiate analgesic: Secondary | ICD-10-CM | POA: Diagnosis not present

## 2019-01-05 DIAGNOSIS — G894 Chronic pain syndrome: Secondary | ICD-10-CM | POA: Diagnosis not present

## 2019-01-25 ENCOUNTER — Other Ambulatory Visit: Payer: Self-pay | Admitting: Neurology

## 2019-01-26 DIAGNOSIS — Z23 Encounter for immunization: Secondary | ICD-10-CM | POA: Diagnosis not present

## 2019-01-26 DIAGNOSIS — F17208 Nicotine dependence, unspecified, with other nicotine-induced disorders: Secondary | ICD-10-CM | POA: Diagnosis not present

## 2019-01-26 DIAGNOSIS — Z6825 Body mass index (BMI) 25.0-25.9, adult: Secondary | ICD-10-CM | POA: Diagnosis not present

## 2019-01-26 DIAGNOSIS — M545 Low back pain: Secondary | ICD-10-CM | POA: Diagnosis not present

## 2019-01-26 DIAGNOSIS — Z1159 Encounter for screening for other viral diseases: Secondary | ICD-10-CM | POA: Diagnosis not present

## 2019-01-26 DIAGNOSIS — J441 Chronic obstructive pulmonary disease with (acute) exacerbation: Secondary | ICD-10-CM | POA: Diagnosis not present

## 2019-02-06 DIAGNOSIS — R05 Cough: Secondary | ICD-10-CM | POA: Diagnosis not present

## 2019-02-06 DIAGNOSIS — J441 Chronic obstructive pulmonary disease with (acute) exacerbation: Secondary | ICD-10-CM | POA: Diagnosis not present

## 2019-02-07 DIAGNOSIS — R05 Cough: Secondary | ICD-10-CM | POA: Diagnosis not present

## 2019-02-13 DIAGNOSIS — J441 Chronic obstructive pulmonary disease with (acute) exacerbation: Secondary | ICD-10-CM | POA: Diagnosis not present

## 2019-02-14 DIAGNOSIS — J441 Chronic obstructive pulmonary disease with (acute) exacerbation: Secondary | ICD-10-CM | POA: Diagnosis not present

## 2019-02-14 DIAGNOSIS — R05 Cough: Secondary | ICD-10-CM | POA: Diagnosis not present

## 2019-03-08 DIAGNOSIS — R300 Dysuria: Secondary | ICD-10-CM | POA: Diagnosis not present

## 2019-03-08 DIAGNOSIS — R519 Headache, unspecified: Secondary | ICD-10-CM | POA: Diagnosis not present

## 2019-04-21 DIAGNOSIS — J180 Bronchopneumonia, unspecified organism: Secondary | ICD-10-CM | POA: Diagnosis not present

## 2019-04-21 DIAGNOSIS — Z6825 Body mass index (BMI) 25.0-25.9, adult: Secondary | ICD-10-CM | POA: Diagnosis not present

## 2019-04-21 DIAGNOSIS — M545 Low back pain: Secondary | ICD-10-CM | POA: Diagnosis not present

## 2019-04-21 DIAGNOSIS — Z1159 Encounter for screening for other viral diseases: Secondary | ICD-10-CM | POA: Diagnosis not present

## 2019-05-08 DIAGNOSIS — J449 Chronic obstructive pulmonary disease, unspecified: Secondary | ICD-10-CM | POA: Insufficient documentation

## 2019-05-08 DIAGNOSIS — J4489 Other specified chronic obstructive pulmonary disease: Secondary | ICD-10-CM

## 2019-05-08 HISTORY — DX: Other specified chronic obstructive pulmonary disease: J44.89

## 2019-05-09 ENCOUNTER — Other Ambulatory Visit: Payer: Self-pay

## 2019-05-09 ENCOUNTER — Ambulatory Visit (INDEPENDENT_AMBULATORY_CARE_PROVIDER_SITE_OTHER): Payer: Medicare HMO | Admitting: Legal Medicine

## 2019-05-09 ENCOUNTER — Encounter: Payer: Self-pay | Admitting: Legal Medicine

## 2019-05-09 DIAGNOSIS — J449 Chronic obstructive pulmonary disease, unspecified: Secondary | ICD-10-CM

## 2019-05-09 DIAGNOSIS — S46012A Strain of muscle(s) and tendon(s) of the rotator cuff of left shoulder, initial encounter: Secondary | ICD-10-CM

## 2019-05-09 DIAGNOSIS — M75102 Unspecified rotator cuff tear or rupture of left shoulder, not specified as traumatic: Secondary | ICD-10-CM | POA: Insufficient documentation

## 2019-05-09 DIAGNOSIS — Z515 Encounter for palliative care: Secondary | ICD-10-CM | POA: Insufficient documentation

## 2019-05-09 HISTORY — DX: Unspecified rotator cuff tear or rupture of left shoulder, not specified as traumatic: M75.102

## 2019-05-09 MED ORDER — HYDROCODONE-ACETAMINOPHEN 5-325 MG PO TABS: 1.0000 | ORAL_TABLET | Freq: Three times a day (TID) | ORAL | 0 refills | Status: DC

## 2019-05-09 NOTE — Assessment & Plan Note (Signed)
Refer to dr. Alcide Clever.  I have been unable to help him with multiple inhalers.  Chest x-ray normal.

## 2019-05-09 NOTE — Progress Notes (Signed)
Acute Office Visit  Subjective:    Patient ID: Clarence Davis, male    DOB: [birth year], 59 y.o.   MRN: [id number]  Chief Complaint  Patient presents with  . COPD  . Shoulder Pain    HPI Patient is in today for persistent cough November.  Not productive , no fever. Used steroids and penicillin without help  Left shoulder pain: Patient ha severe shoulder pain for 6 months, he has seen ortho but did not want surgery, he goes to pain clinic, Paing has increased over last 2 weeks and he has little motion.  He wants referral to orthopedics.  Past Medical History:  Diagnosis Date  . Alcoholic ([place])    quit drinking many yrs ago per pt  . Anxiety   . Arthritis    "hands" (04/05/2015)  . Bilateral carpal tunnel syndrome   . Chronic back pain    "all over"  . Chronic diarrhea   . Depression    takes Citalopram daily  . Dry skin   . Dysrhythmia    PVC's   . GERD (gastroesophageal reflux disease)    takes Omeprazole daily  . Head injury    early 21's  . Headache    "weekly" (04/05/2015)  . Hepatitis C    "treated w/RX; don't have it anymore" (04/05/2015)    Past Surgical History:  Procedure Laterality Date  . ANTERIOR CERVICAL DECOMP/DISCECTOMY FUSION N/A 12/15/2013   Procedure: CERVICAL FOUR TO FIVE, CERVICAL FIVE TO SIX, CERVICAL SIX TO SEVEN CERVICAL DECOMPRESSION/DISCECTOMY FUSION 3 LEVELS;  Surgeon: Clarence Stakes, MD;  Location: [facility];  Service: Neurosurgery;  Laterality: N/A;  C4-5 C5-6 C6-7 Anterior cervical decompression/diskectomy/fusion  . BACK SURGERY    . CLOSED REDUCTION SHOULDER DISLOCATION Left 1975   patient denies  . ESOPHAGOGASTRODUODENOSCOPY    . HERNIA REPAIR    . INGUINAL HERNIA REPAIR Left X 3   "  . POSTERIOR CERVICAL FUSION/FORAMINOTOMY  04/05/2015  . POSTERIOR CERVICAL FUSION/FORAMINOTOMY N/A 04/05/2015   Procedure: Cervical three-four, Cervical four-five, Cervical five-six, Cervical six-seven Posterior cervical fusion with lateral mass  fixation;  Surgeon: Clarence Cha, MD;  Location: [facility];  Service: Neurosurgery;  Laterality: N/A;  C3-4 C4-5 C5-6 C6-7 Posterior cervical fusion with lateral mass fixation  . UMBILICAL HERNIA REPAIR    . WISDOM TOOTH EXTRACTION      Family History  Problem Relation Age of Onset  . Throat cancer Father   . Heart disease Sister     Social History   Socioeconomic History  . Marital status: Married    Spouse name: Not on file  . Number of children: 2  . Years of education: Not on file  . Highest education level: 12th grade  Occupational History  . Occupation: disabled  Tobacco Use  . Smoking status: Current Some Day Smoker    Packs/day: 0.15    Years: 28.00    Pack years: 4.20    Types: E-cigarettes, Cigarettes    Last attempt to quit: 02/25/2015    Years since quitting: 4.2  . Smokeless tobacco: Never Used  Substance and Sexual Activity  . Alcohol use: Never    Comment: 04/05/2015 "recovering alcoholic; last drink [id number]"  . Drug use: No  . Sexual activity: Not Currently  Other Topics Concern  . Not on file  Social History Narrative   Patient is left-handed. He lives with his long time girlfriend Clarence Davis in a one level home home. He drinks 3 cups of  coffee and 4 Coca-Colas a day. He tries to walk when able.   Social Determinants of Health   Financial Resource Strain:   . Difficulty of Paying Living Expenses: Not on file  Food Insecurity:   . Worried About Charity fundraiser in the Last Year: Not on file  . Ran Out of Food in the Last Year: Not on file  Transportation Needs:   . Lack of Transportation (Medical): Not on file  . Lack of Transportation (Non-Medical): Not on file  Physical Activity:   . Days of Exercise per Week: Not on file  . Minutes of Exercise per Session: Not on file  Stress:   . Feeling of Stress : Not on file  Social Connections:   . Frequency of Communication with Friends and Family: Not on file  . Frequency of Social  Gatherings with Friends and Family: Not on file  . Attends Religious Services: Not on file  . Active Member of Clubs or Organizations: Not on file  . Attends Archivist Meetings: Not on file  . Marital Status: Not on file  Intimate Partner Violence:   . Fear of Current or Ex-Partner: Not on file  . Emotionally Abused: Not on file  . Physically Abused: Not on file  . Sexually Abused: Not on file    Outpatient Medications Prior to Visit  Medication Sig Dispense Refill  . citalopram (CELEXA) 20 MG tablet Take 20 mg by mouth at bedtime.   3  . cyclobenzaprine (FLEXERIL) 10 MG tablet Take 10 mg by mouth 3 (three) times daily as needed for muscle spasms.   3  . lidocaine (LIDODERM) 5 %     . Multiple Vitamin (MULTIVITAMIN WITH MINERALS) TABS tablet Take 1 tablet by mouth daily.    . nortriptyline (PAMELOR) 10 MG capsule TAKE 1 CAPSULE(10 MG) BY MOUTH AT BEDTIME 30 capsule 3  . omeprazole (PRILOSEC) 40 MG capsule Take 40 mg by mouth daily.  6  . tamsulosin (FLOMAX) 0.4 MG CAPS capsule Take 0.4 mg by mouth at bedtime.    Marland Kitchen HYDROcodone-acetaminophen (NORCO/VICODIN) 5-325 MG tablet     . Oxycodone HCl 10 MG TABS Take 10 mg by mouth every 6 (six) hours as needed (severe pain).   0  . simvastatin (ZOCOR) 40 MG tablet      No facility-administered medications prior to visit.    Allergies  Allergen Reactions  . Tramadol Rash and Other (See Comments)    dizzy    Review of Systems  Constitutional: Negative.   HENT: Negative.   Respiratory: Positive for cough.   Cardiovascular: Negative.   Genitourinary: Negative.        Objective:    Physical Exam Constitutional:      Appearance: Normal appearance. He is normal weight.  Pulmonary:     Effort: Pulmonary effort is normal.     Breath sounds: Rhonchi present.  Musculoskeletal:     Right shoulder: Normal.     Left shoulder: Tenderness present. Decreased range of motion.  Neurological:     Mental Status: He is alert.      BP 130/80 (BP Location: Right Arm, Patient Position: Sitting)   Pulse 88   Temp 98.5 F (36.9 C) (Oral)   Resp 17   Ht 5\' 11"  (1.803 m)   Wt 182 lb (82.6 kg)   BMI 25.38 kg/m  Wt Readings from Last 3 Encounters:  05/09/19 182 lb (82.6 kg)  08/12/18 193 lb (87.5 kg)  02/25/16  193 lb 3.2 oz (87.6 kg)    Health Maintenance Due  Topic Date Due  . Hepatitis C Screening  [birth year]  . HIV Screening  09/25/1976  . TETANUS/TDAP  09/25/1980  . COLONOSCOPY  09/26/2011    There are no preventive care reminders to display for this patient.   No results found for: TSH Lab Results  Component Value Date   WBC 4.7 02/19/2016   HGB 15.6 02/19/2016   HCT 45.0 02/19/2016   MCV 94.5 02/19/2016   PLT 148 (L) 02/19/2016   Lab Results  Component Value Date   NA 137 02/19/2016   K 4.0 02/19/2016   CO2 29 02/19/2016   GLUCOSE 97 02/19/2016   BUN 9 02/19/2016   CREATININE 1.07 02/19/2016   BILITOT 0.7 02/19/2016   ALKPHOS 65 02/19/2016   AST 19 02/19/2016   ALT 16 (L) 02/19/2016   PROT 6.9 02/19/2016   ALBUMIN 4.2 02/19/2016   CALCIUM 9.4 02/19/2016   ANIONGAP 8 02/19/2016   No results found for: CHOL No results found for: HDL No results found for: LDLCALC No results found for: TRIG No results found for: CHOLHDL No results found for: HGBA1C     Assessment & Plan:   Problem List Items Addressed This Visit      Respiratory   Obstructive chronic bronchitis without exacerbation ([place]) (Chronic)    Refer to dr. Alcide Clever.  I have been unable to help him with multiple inhalers.  Chest x-ray normal.        Musculoskeletal and Integument   Left rotator cuff tear (Chronic)    Hydrocodone for pain.  Refer to Dr. Mariel Kansky for possible RCR.      Relevant Medications   HYDROcodone-acetaminophen (NORCO/VICODIN) 5-325 MG tablet     Other   Hospice care patient       Meds ordered this encounter  Medications  . HYDROcodone-acetaminophen (NORCO/VICODIN) 5-325 MG tablet     Sig: Take 1 tablet by mouth every 8 (eight) hours.    Dispense:  30 tablet    Refill:  0     Reinaldo Meeker, MD

## 2019-05-09 NOTE — Patient Instructions (Signed)

## 2019-05-09 NOTE — Assessment & Plan Note (Signed)
Hydrocodone for pain.  Refer to Dr. Mariel Kansky for possible RCR.

## 2019-05-09 NOTE — Progress Notes (Signed)
v

## 2019-05-12 ENCOUNTER — Other Ambulatory Visit: Payer: Self-pay | Admitting: Legal Medicine

## 2019-05-17 ENCOUNTER — Other Ambulatory Visit: Payer: Self-pay

## 2019-05-17 ENCOUNTER — Ambulatory Visit (INDEPENDENT_AMBULATORY_CARE_PROVIDER_SITE_OTHER): Payer: Medicare HMO

## 2019-05-17 ENCOUNTER — Ambulatory Visit (INDEPENDENT_AMBULATORY_CARE_PROVIDER_SITE_OTHER): Payer: Medicare HMO | Admitting: Orthopedic Surgery

## 2019-05-17 DIAGNOSIS — M25512 Pain in left shoulder: Secondary | ICD-10-CM

## 2019-05-20 ENCOUNTER — Encounter: Payer: Self-pay | Admitting: Orthopedic Surgery

## 2019-05-20 NOTE — Progress Notes (Signed)
Office Visit Note   Patient: Clarence Davis           Date of Birth: February 11, 1961           MRN: [id number] Visit Date: 05/17/2019 Requested by: Lillard Anes, MD [address],  [place] [id number] PCP: Lillard Anes, MD  Subjective: Chief Complaint  Patient presents with  . Shoulder Pain    HPI: Clarence Davis is a patient with left shoulder pain.  He reports a stabbing sharp pain in the shoulder which has been ongoing for several years.  Denies any history of injury.  He has been doing work his whole life which involves a lot of lifting.  He is left-hand dominant.  Does report neck pain as well as numbness and tingling which runs down from the neck into the hand.  The pain will wake him from sleep at night.  Had previous extensive cervical spine surgery in 2015 by Dr. Joya Salm.  Cervical spine MRI in 2018 does show C4-5 left-sided myelomalacia.  He states that holding his arm in close to the body helps.  But he is arm up overhead is not necessarily helpful.  He was in pain management but no longer is in pain management at this time.  He is waiting to be scheduled in the new pain clinic.              ROS: All systems reviewed are negative as they relate to the chief complaint within the history of present illness.  Patient denies  fevers or chills.   Assessment & Plan: Visit Diagnoses:  1. Left shoulder pain, unspecified chronicity     Plan: Impression is left shoulder pain with pretty compelling case that this is not a structural problem in the shoulder but is instead referred pain from the neck.  He did have myelomalacia of the cervical spine on the left-hand side in 2018 which does correlate with his sharp stabbing pain.  Radiographs shoulder are normal.  Exam of the shoulder is also fairly normal in terms of no loss of strength or loss of passive motion.  Plan MRI cervical spine evaluate left-sided C4-5 disc problem follow-up after that study and likely referral  to neurosurgery at that time.  Follow-Up Instructions: Return for after MRI.   Orders:  Orders Placed This Encounter  Procedures  . XR Shoulder Left  . MR Cervical Spine w/o contrast   No orders of the defined types were placed in this encounter.     Procedures: No procedures performed   Clinical Data: No additional findings.  Objective: Vital Signs: There were no vitals taken for this visit.  Physical Exam:   Constitutional: Patient appears well-developed HEENT:  Head: Normocephalic Eyes:EOM are normal Neck: Normal range of motion Cardiovascular: Normal rate Pulmonary/chest: Effort normal Neurologic: Patient is alert Skin: Skin is warm Psychiatric: Patient has normal mood and affect    Ortho Exam: Ortho exam demonstrates full active and passive range of motion of the right arm.  On the left-hand side he has good EPL FPL interosseous function with good biceps triceps and deltoid strength.  Has pretty good rotator cuff strength infraspinatus supraspinatus and subscap muscle testing.  No coarse grinding or crepitus with internal X rotation of that left arm.  Does have some paresthesias in the C5-6 distribution left versus right.  Neck range of motion predictably limited following his surgery with flexion extension and rotation.  No muscle atrophy in the left or right  arm.  Reflexes symmetric 1+ out of 4 bilateral biceps and triceps.  Specialty Comments:  No specialty comments available.  Imaging: No results found.   PMFS History: Patient Active Problem List   Diagnosis Date Noted  . Left rotator cuff tear 05/09/2019  . Hospice care patient 05/09/2019  . Obstructive chronic bronchitis without exacerbation ([place]) 05/08/2019  . Lumbar adjacent segment disease with spondylolisthesis 02/25/2016  . Cervical pseudoarthrosis ([place]) 04/05/2015  . Cervical stenosis of spinal canal 12/15/2013   Past Medical History:  Diagnosis Date  . Alcoholic ([place])    quit drinking  many yrs ago per pt  . Anxiety   . Arthritis    "hands" (04/05/2015)  . Bilateral carpal tunnel syndrome   . Chronic back pain    "all over"  . Chronic diarrhea   . Depression    takes Citalopram daily  . Dry skin   . Dysrhythmia    PVC's   . GERD (gastroesophageal reflux disease)    takes Omeprazole daily  . Head injury    early 24's  . Headache    "weekly" (04/05/2015)  . Hepatitis C    "treated w/RX; don't have it anymore" (04/05/2015)    Family History  Problem Relation Age of Onset  . Throat cancer Father   . Heart disease Sister     Past Surgical History:  Procedure Laterality Date  . ANTERIOR CERVICAL DECOMP/DISCECTOMY FUSION N/A 12/15/2013   Procedure: CERVICAL FOUR TO FIVE, CERVICAL FIVE TO SIX, CERVICAL SIX TO SEVEN CERVICAL DECOMPRESSION/DISCECTOMY FUSION 3 LEVELS;  Surgeon: Floyce Stakes, MD;  Location: [facility];  Service: Neurosurgery;  Laterality: N/A;  C4-5 C5-6 C6-7 Anterior cervical decompression/diskectomy/fusion  . BACK SURGERY    . CLOSED REDUCTION SHOULDER DISLOCATION Left 1975   patient denies  . ESOPHAGOGASTRODUODENOSCOPY    . HERNIA REPAIR    . INGUINAL HERNIA REPAIR Left X 3   "  . POSTERIOR CERVICAL FUSION/FORAMINOTOMY  04/05/2015  . POSTERIOR CERVICAL FUSION/FORAMINOTOMY N/A 04/05/2015   Procedure: Cervical three-four, Cervical four-five, Cervical five-six, Cervical six-seven Posterior cervical fusion with lateral mass fixation;  Surgeon: Leeroy Cha, MD;  Location: [facility];  Service: Neurosurgery;  Laterality: N/A;  C3-4 C4-5 C5-6 C6-7 Posterior cervical fusion with lateral mass fixation  . UMBILICAL HERNIA REPAIR    . WISDOM TOOTH EXTRACTION     Social History   Occupational History  . Occupation: disabled  Tobacco Use  . Smoking status: Current Some Day Smoker    Packs/day: 0.15    Years: 28.00    Pack years: 4.20    Types: E-cigarettes, Cigarettes    Last attempt to quit: 02/25/2015    Years since quitting: 4.2  .  Smokeless tobacco: Never Used  Substance and Sexual Activity  . Alcohol use: Never    Comment: 04/05/2015 "recovering alcoholic; last drink [id number]"  . Drug use: No  . Sexual activity: Not Currently

## 2019-05-22 DIAGNOSIS — N401 Enlarged prostate with lower urinary tract symptoms: Secondary | ICD-10-CM | POA: Insufficient documentation

## 2019-05-22 DIAGNOSIS — N138 Other obstructive and reflux uropathy: Secondary | ICD-10-CM

## 2019-05-22 DIAGNOSIS — M150 Primary generalized (osteo)arthritis: Secondary | ICD-10-CM

## 2019-05-22 HISTORY — DX: Primary generalized (osteo)arthritis: M15.0

## 2019-05-22 HISTORY — DX: Other obstructive and reflux uropathy: N13.8

## 2019-05-23 ENCOUNTER — Ambulatory Visit (INDEPENDENT_AMBULATORY_CARE_PROVIDER_SITE_OTHER): Payer: Medicare HMO | Admitting: Legal Medicine

## 2019-05-23 ENCOUNTER — Encounter: Payer: Self-pay | Admitting: Legal Medicine

## 2019-05-23 ENCOUNTER — Other Ambulatory Visit: Payer: Self-pay

## 2019-05-23 DIAGNOSIS — K4090 Unilateral inguinal hernia, without obstruction or gangrene, not specified as recurrent: Secondary | ICD-10-CM

## 2019-05-23 DIAGNOSIS — S46012A Strain of muscle(s) and tendon(s) of the rotator cuff of left shoulder, initial encounter: Secondary | ICD-10-CM

## 2019-05-23 HISTORY — DX: Unilateral inguinal hernia, without obstruction or gangrene, not specified as recurrent: K40.90

## 2019-05-23 MED ORDER — HYDROCODONE-ACETAMINOPHEN 5-325 MG PO TABS: 1.0000 | ORAL_TABLET | Freq: Three times a day (TID) | ORAL | 0 refills | Status: DC

## 2019-05-23 NOTE — Assessment & Plan Note (Signed)
AN INDIVIDUAL CARE PLAN was established and reinforced today.  The patient's status was assessed using clinical findings on exam, labs, and other diagnostic testing. Patient's success at meeting treatment goals based on disease specific evidence-bassed guidelines and found to be in good control. RECOMMENDATIONS include maintaining present medicines and treatment. 

## 2019-05-23 NOTE — Assessment & Plan Note (Addendum)
Large right inguina hernia present .  Active bowel sound.  No strangulation.  Needs referral to general surgery.  Refer to dr Amalia Hailey

## 2019-05-23 NOTE — Progress Notes (Signed)
Acute Office Visit  Subjective:    Patient ID: Clarence Davis, male    DOB: [birth year], 59 y.o.   MRN: [id number]  Chief Complaint  Patient presents with  . Hernia    HPI Patient is in today for inguinal hernia first noted one week ago.  No injury , increase pain when walking.  Does housework.  [facility].  [facility] is [facility].   Past Medical History:  Diagnosis Date  . Alcoholic ([place])    quit drinking many yrs ago per pt  . Anxiety   . Arthritis    "hands" (04/05/2015)  . Bilateral carpal tunnel syndrome   . Chronic back pain    "all over"  . Chronic diarrhea   . Depression    takes Citalopram daily  . Dry skin   . Dysrhythmia    PVC's   . GERD (gastroesophageal reflux disease)    takes Omeprazole daily  . Head injury    early 12's  . Headache    "weekly" (04/05/2015)  . Hepatitis C    "treated w/RX; don't have it anymore" (04/05/2015)    Past Surgical History:  Procedure Laterality Date  . ANTERIOR CERVICAL DECOMP/DISCECTOMY FUSION N/A 12/15/2013   Procedure: CERVICAL FOUR TO FIVE, CERVICAL FIVE TO SIX, CERVICAL SIX TO SEVEN CERVICAL DECOMPRESSION/DISCECTOMY FUSION 3 LEVELS;  Surgeon: Floyce Stakes, MD;  Location: [facility];  Service: Neurosurgery;  Laterality: N/A;  C4-5 C5-6 C6-7 Anterior cervical decompression/diskectomy/fusion  . BACK SURGERY    . CLOSED REDUCTION SHOULDER DISLOCATION Left 1975   patient denies  . ESOPHAGOGASTRODUODENOSCOPY    . HERNIA REPAIR    . INGUINAL HERNIA REPAIR Left X 3   "  . POSTERIOR CERVICAL FUSION/FORAMINOTOMY  04/05/2015  . POSTERIOR CERVICAL FUSION/FORAMINOTOMY N/A 04/05/2015   Procedure: Cervical three-four, Cervical four-five, Cervical five-six, Cervical six-seven Posterior cervical fusion with lateral mass fixation;  Surgeon: Leeroy Cha, MD;  Location: [facility];  Service: Neurosurgery;  Laterality: N/A;  C3-4 C4-5 C5-6 C6-7 Posterior  cervical fusion with lateral mass fixation  . UMBILICAL HERNIA REPAIR    . WISDOM TOOTH EXTRACTION      Family History  Problem Relation Age of Onset  . Throat cancer Father   . Heart disease Sister     Social History   Socioeconomic History  . Marital status: Married    Spouse name: Not on file  . Number of children: 2  . Years of education: Not on file  . Highest education level: 12th grade  Occupational History  . Occupation: disabled  Tobacco Use  . Smoking status: Current Some Day Smoker    Packs/day: 0.15    Years: 28.00    Pack years: 4.20    Types: E-cigarettes, Cigarettes    Last attempt to quit: 02/25/2015    Years since quitting: 4.2  . Smokeless tobacco: Never Used  Substance and Sexual Activity  . Alcohol use: Never    Comment: 04/05/2015 "recovering alcoholic; last drink [id number]"  . Drug use: No  . Sexual activity: Not Currently  Other Topics Concern  . Not on file  Social History Narrative   Patient is left-handed. He lives with his long time girlfriend [name] in a one level home home. He drinks 3 cups of coffee and 4 Coca-Colas a day. He tries to walk when able.   Social Determinants of Health   Financial Resource Strain:   .  Difficulty of Paying Living Expenses: Not on file  Food Insecurity:   . Worried About Charity fundraiser in the Last Year: Not on file  . Ran Out of Food in the Last Year: Not on file  Transportation Needs:   . Lack of Transportation (Medical): Not on file  . Lack of Transportation (Non-Medical): Not on file  Physical Activity:   . Days of Exercise per Week: Not on file  . Minutes of Exercise per Session: Not on file  Stress:   . Feeling of Stress : Not on file  Social Connections:   . Frequency of Communication with Friends and Family: Not on file  . Frequency of Social Gatherings with Friends and Family: Not on file  . Attends Religious Services: Not on file  . Active Member of Clubs or Organizations: Not on  file  . Attends Archivist Meetings: Not on file  . Marital Status: Not on file  Intimate Partner Violence:   . Fear of Current or Ex-Partner: Not on file  . Emotionally Abused: Not on file  . Physically Abused: Not on file  . Sexually Abused: Not on file    Outpatient Medications Prior to Visit  Medication Sig Dispense Refill  . citalopram (CELEXA) 20 MG tablet Take 1.5 tablets (30 mg total) by mouth daily. 135 tablet 2  . Multiple Vitamin (MULTIVITAMIN WITH MINERALS) TABS tablet Take 1 tablet by mouth daily.    Marland Kitchen omeprazole (PRILOSEC) 40 MG capsule Take 40 mg by mouth daily.  6  . simvastatin (ZOCOR) 40 MG tablet     . tamsulosin (FLOMAX) 0.4 MG CAPS capsule Take 0.4 mg by mouth at bedtime.    Marland Kitchen tiotropium (SPIRIVA) 18 MCG inhalation capsule Place 18 mcg into inhaler and inhale daily.    . cyclobenzaprine (FLEXERIL) 10 MG tablet Take 10 mg by mouth 3 (three) times daily as needed for muscle spasms.   3  . lidocaine (LIDODERM) 5 %     . nortriptyline (PAMELOR) 10 MG capsule TAKE 1 CAPSULE(10 MG) BY MOUTH AT BEDTIME 30 capsule 3  . Oxycodone HCl 10 MG TABS Take 10 mg by mouth every 6 (six) hours as needed (severe pain).   0  . HYDROcodone-acetaminophen (NORCO/VICODIN) 5-325 MG tablet Take 1 tablet by mouth every 8 (eight) hours. 30 tablet 0   No facility-administered medications prior to visit.    Allergies  Allergen Reactions  . Tramadol Rash and Other (See Comments)    dizzy    Review of Systems  Constitutional: Negative.   HENT: Negative.   Respiratory: Negative.   Cardiovascular: Negative.   Gastrointestinal: Negative.        Right inguinal pain  Genitourinary: Negative.   Musculoskeletal: Positive for back pain.  Neurological: Negative.        Objective:    Physical Exam Vitals reviewed.  Constitutional:      Appearance: Normal appearance.  Cardiovascular:     Rate and Rhythm: Normal rate and regular rhythm.     Pulses: Normal pulses.     Heart  sounds: Normal heart sounds.  Pulmonary:     Effort: Pulmonary effort is normal.     Breath sounds: Normal breath sounds.  Abdominal:     Hernia: A hernia is present.     Comments: Inguinal hernia right side, has bowel sounds  Neurological:     Mental Status: He is alert.     BP (!) 140/92   Pulse 94  Temp 97.8 F (36.6 C)   Ht 5\' 10"  (1.778 m)   Wt 186 lb (84.4 kg)   SpO2 96%   BMI 26.69 kg/m  Wt Readings from Last 3 Encounters:  05/23/19 186 lb (84.4 kg)  05/09/19 182 lb (82.6 kg)  08/12/18 193 lb (87.5 kg)    Health Maintenance Due  Topic Date Due  . Hepatitis C Screening  01-Jul-1960  . HIV Screening  09/25/1976  . TETANUS/TDAP  09/25/1980  . COLONOSCOPY  09/26/2011    There are no preventive care reminders to display for this patient.   No results found for: TSH Lab Results  Component Value Date   WBC 4.7 02/19/2016   HGB 15.6 02/19/2016   HCT 45.0 02/19/2016   MCV 94.5 02/19/2016   PLT 148 (L) 02/19/2016   Lab Results  Component Value Date   NA 137 02/19/2016   K 4.0 02/19/2016   CO2 29 02/19/2016   GLUCOSE 97 02/19/2016   BUN 9 02/19/2016   CREATININE 1.07 02/19/2016   BILITOT 0.7 02/19/2016   ALKPHOS 65 02/19/2016   AST 19 02/19/2016   ALT 16 (L) 02/19/2016   PROT 6.9 02/19/2016   ALBUMIN 4.2 02/19/2016   CALCIUM 9.4 02/19/2016   ANIONGAP 8 02/19/2016   No results found for: CHOL No results found for: HDL No results found for: LDLCALC No results found for: TRIG No results found for: CHOLHDL No results found for: HGBA1C     Assessment & Plan:   Problem List Items Addressed This Visit      Musculoskeletal and Integument   Left rotator cuff tear (Chronic)    AN INDIVIDUAL CARE PLAN was established and reinforced today.  The patient's status was assessed using clinical findings on exam, labs, and other diagnostic testing. Patient's success at meeting treatment goals based on disease specific evidence-bassed guidelines and found to be  in good control. RECOMMENDATIONS include maintaining present medicines and treatment.      Relevant Medications   HYDROcodone-acetaminophen (NORCO/VICODIN) 5-325 MG tablet     Other   Inguinal hernia, right    Large right inguina hernia present .  Active bowel sound.  No strangulation.  Needs referral to general surgery.  Refer to dr Amalia Hailey      Relevant Orders   Ambulatory referral to General Surgery       Meds ordered this encounter  Medications  . HYDROcodone-acetaminophen (NORCO/VICODIN) 5-325 MG tablet    Sig: Take 1 tablet by mouth every 8 (eight) hours.    Dispense:  30 tablet    Refill:  0     Reinaldo Meeker, MD

## 2019-05-24 DIAGNOSIS — R1031 Right lower quadrant pain: Secondary | ICD-10-CM | POA: Diagnosis not present

## 2019-05-24 DIAGNOSIS — K219 Gastro-esophageal reflux disease without esophagitis: Secondary | ICD-10-CM | POA: Diagnosis not present

## 2019-05-24 DIAGNOSIS — K4090 Unilateral inguinal hernia, without obstruction or gangrene, not specified as recurrent: Secondary | ICD-10-CM | POA: Diagnosis not present

## 2019-05-29 DIAGNOSIS — K4090 Unilateral inguinal hernia, without obstruction or gangrene, not specified as recurrent: Secondary | ICD-10-CM | POA: Diagnosis not present

## 2019-05-29 DIAGNOSIS — Z01812 Encounter for preprocedural laboratory examination: Secondary | ICD-10-CM | POA: Diagnosis not present

## 2019-05-29 DIAGNOSIS — Z20822 Contact with and (suspected) exposure to covid-19: Secondary | ICD-10-CM | POA: Diagnosis not present

## 2019-05-31 ENCOUNTER — Telehealth: Payer: Self-pay

## 2019-05-31 NOTE — Telephone Encounter (Signed)
Patient called and stated he still has not gotten in with [facility] management and has surgery on Monday, asking for pain medication to take until his surgery on Monday.

## 2019-05-31 NOTE — Telephone Encounter (Signed)
No more pain medicines, need to see [facility]. lp

## 2019-06-01 DIAGNOSIS — Z01810 Encounter for preprocedural cardiovascular examination: Secondary | ICD-10-CM | POA: Diagnosis not present

## 2019-06-01 DIAGNOSIS — K4090 Unilateral inguinal hernia, without obstruction or gangrene, not specified as recurrent: Secondary | ICD-10-CM | POA: Diagnosis not present

## 2019-06-01 DIAGNOSIS — Z01812 Encounter for preprocedural laboratory examination: Secondary | ICD-10-CM | POA: Diagnosis not present

## 2019-06-05 DIAGNOSIS — K4030 Unilateral inguinal hernia, with obstruction, without gangrene, not specified as recurrent: Secondary | ICD-10-CM | POA: Diagnosis not present

## 2019-06-05 DIAGNOSIS — K4190 Unilateral femoral hernia, without obstruction or gangrene, not specified as recurrent: Secondary | ICD-10-CM | POA: Diagnosis not present

## 2019-06-05 DIAGNOSIS — K219 Gastro-esophageal reflux disease without esophagitis: Secondary | ICD-10-CM | POA: Diagnosis not present

## 2019-06-15 ENCOUNTER — Encounter: Payer: Self-pay | Admitting: Legal Medicine

## 2019-06-15 ENCOUNTER — Ambulatory Visit (INDEPENDENT_AMBULATORY_CARE_PROVIDER_SITE_OTHER): Payer: Medicare HMO | Admitting: Legal Medicine

## 2019-06-15 ENCOUNTER — Other Ambulatory Visit: Payer: Self-pay

## 2019-06-15 VITALS — BP 122/78 | HR 98 | Temp 98.2°F | Resp 16 | Ht 71.0 in | Wt 182.4 lb

## 2019-06-15 DIAGNOSIS — F4310 Post-traumatic stress disorder, unspecified: Secondary | ICD-10-CM | POA: Insufficient documentation

## 2019-06-15 DIAGNOSIS — G4733 Obstructive sleep apnea (adult) (pediatric): Secondary | ICD-10-CM | POA: Insufficient documentation

## 2019-06-15 DIAGNOSIS — F515 Nightmare disorder: Secondary | ICD-10-CM | POA: Diagnosis not present

## 2019-06-15 HISTORY — DX: Obstructive sleep apnea (adult) (pediatric): G47.33

## 2019-06-15 HISTORY — DX: Post-traumatic stress disorder, unspecified: F43.10

## 2019-06-15 MED ORDER — QUETIAPINE FUMARATE 50 MG PO TABS: 50.0000 mg | ORAL_TABLET | Freq: Every day | ORAL | 2 refills | Status: DC
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
## 2019-06-15 NOTE — Assessment & Plan Note (Signed)
patient has symptoms of OSA and sleep apnea.  He needs sleep study

## 2019-07-11 ENCOUNTER — Other Ambulatory Visit: Payer: Self-pay | Admitting: Neurology

## 2019-09-05 ENCOUNTER — Ambulatory Visit: Payer: Self-pay | Admitting: Legal Medicine

## 2019-09-09 ENCOUNTER — Other Ambulatory Visit: Payer: Self-pay | Admitting: Legal Medicine

## 2019-09-18 ENCOUNTER — Telehealth: Payer: Self-pay

## 2019-09-18 ENCOUNTER — Encounter: Payer: Self-pay | Admitting: Legal Medicine

## 2019-09-18 ENCOUNTER — Other Ambulatory Visit: Payer: Self-pay

## 2019-09-18 ENCOUNTER — Other Ambulatory Visit: Payer: Self-pay | Admitting: Legal Medicine

## 2019-09-18 ENCOUNTER — Ambulatory Visit (INDEPENDENT_AMBULATORY_CARE_PROVIDER_SITE_OTHER): Payer: Medicare HMO | Admitting: Legal Medicine

## 2019-09-18 VITALS — BP 130/80 | HR 90 | Temp 97.4°F | Resp 16 | Ht 70.0 in | Wt 176.6 lb

## 2019-09-18 DIAGNOSIS — R1032 Left lower quadrant pain: Secondary | ICD-10-CM

## 2019-09-18 DIAGNOSIS — K2100 Gastro-esophageal reflux disease with esophagitis, without bleeding: Secondary | ICD-10-CM | POA: Diagnosis not present

## 2019-09-18 DIAGNOSIS — K580 Irritable bowel syndrome with diarrhea: Secondary | ICD-10-CM | POA: Diagnosis not present

## 2019-09-18 DIAGNOSIS — S129XXD Fracture of neck, unspecified, subsequent encounter: Secondary | ICD-10-CM

## 2019-09-18 DIAGNOSIS — M4316 Spondylolisthesis, lumbar region: Secondary | ICD-10-CM

## 2019-09-18 MED ORDER — DICYCLOMINE HCL 20 MG PO TABS: 20.0000 mg | ORAL_TABLET | Freq: Four times a day (QID) | ORAL | 3 refills | Status: DC

## 2019-09-18 MED ORDER — OMEPRAZOLE 40 MG PO CPDR: 40.0000 mg | DELAYED_RELEASE_CAPSULE | Freq: Two times a day (BID) | ORAL | 3 refills | Status: DC

## 2019-09-18 NOTE — Telephone Encounter (Signed)
Call to Patient, Dr Henrene Pastor will refer to pain clinic again, patient agreed

## 2019-09-18 NOTE — Telephone Encounter (Signed)
Patient called in asking if anything has been called in for pain. States he "is in agony and cant stand to just sit in pain." Patient specifically asked for Tylonol #3, states "they are the only thing that keeps him going."

## 2019-09-18 NOTE — Progress Notes (Addendum)
Established Patient Office Visit  Subjective:  Patient ID: Clarence Davis, male    DOB: October 15, 1960  Age: 59 y.o. MRN: [id number]  CC:  Chief Complaint  Patient presents with  . Abdominal Pain   HPI Jonothan Heberle presents for abdominal pain for 2 months.  Pain and cramping with diarrhea.  No melena, Pain worse with eating.  Increase bm after eats.   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  He complains of dyspnea due to back pain in mid back.  No cough.  He has none today.  His symptoms are consistent with narcotic withdrawal with abdominal pain and diarrhea and drug seeking behavior.  He did not go to the pain clinic that we referred him to twice in past.  Past Medical History:  Diagnosis Date  . Alcoholic ([place])    quit drinking many yrs ago per pt  . Anxiety   . Arthritis    "hands" (04/05/2015)  . Bilateral carpal tunnel syndrome   . Chronic back pain    "all over"  . Chronic diarrhea   . Depression    takes Citalopram daily  . Dry skin   . Dysrhythmia    PVC's   . GERD (gastroesophageal reflux disease)    takes Omeprazole daily  . Head injury    early 83's  . Headache    "weekly" (04/05/2015)  . Hepatitis C    "treated w/RX; don't have it anymore" (04/05/2015)    Past Surgical History:  Procedure Laterality Date  . ANTERIOR CERVICAL DECOMP/DISCECTOMY FUSION N/A 12/15/2013   Procedure: CERVICAL FOUR TO FIVE, CERVICAL FIVE TO SIX, CERVICAL SIX TO SEVEN CERVICAL DECOMPRESSION/DISCECTOMY FUSION 3 LEVELS;  Surgeon: Floyce Stakes, MD;  Location: [facility];  Service: Neurosurgery;  Laterality: N/A;  C4-5 C5-6 C6-7 Anterior cervical decompression/diskectomy/fusion  . BACK SURGERY    . CLOSED REDUCTION SHOULDER DISLOCATION Left 1975   patient denies  . ESOPHAGOGASTRODUODENOSCOPY    . HERNIA REPAIR    . INGUINAL HERNIA REPAIR Left X 3   "  . POSTERIOR CERVICAL FUSION/FORAMINOTOMY  04/05/2015  . POSTERIOR CERVICAL FUSION/FORAMINOTOMY N/A 04/05/2015   Procedure:  Cervical three-four, Cervical four-five, Cervical five-six, Cervical six-seven Posterior cervical fusion with lateral mass fixation;  Surgeon: Leeroy Cha, MD;  Location: [facility];  Service: Neurosurgery;  Laterality: N/A;  C3-4 C4-5 C5-6 C6-7 Posterior cervical fusion with lateral mass fixation  . UMBILICAL HERNIA REPAIR    . WISDOM TOOTH EXTRACTION      Family History  Problem Relation Age of Onset  . Throat cancer Father   . Heart disease Sister     Social History   Socioeconomic History  . Marital status: Married    Spouse name: Not on file  . Number of children: 2  . Years of education: Not on file  . Highest education level: 12th grade  Occupational History  . Occupation: disabled  Tobacco Use  . Smoking status: Current Some Day Smoker    Packs/day: 0.15    Years: 28.00    Pack years: 4.20    Types: E-cigarettes, Cigarettes    Last attempt to quit: 02/25/2015    Years since quitting: 4.5  . Smokeless tobacco: Never Used  Substance and Sexual Activity  . Alcohol use: Never    Comment: 04/05/2015 "recovering alcoholic; last drink 13/05/4399"  . Drug use: No  . Sexual activity: Not Currently  Other Topics Concern  . Not on file  Social History Narrative  Patient is left-handed. He lives with his long time girlfriend [name] in a one level home home. He drinks 3 cups of coffee and 4 Coca-Colas a day. He tries to walk when able.   Social Determinants of Health   Financial Resource Strain:   . Difficulty of Paying Living Expenses:   Food Insecurity:   . Worried About Charity fundraiser in the Last Year:   . Arboriculturist in the Last Year:   Transportation Needs:   . Film/video editor (Medical):   Marland Kitchen Lack of Transportation (Non-Medical):   Physical Activity:   . Days of Exercise per Week:   . Minutes of Exercise per Session:   Stress:   . Feeling of Stress :   Social Connections:   . Frequency of Communication with Friends and Family:   .  Frequency of Social Gatherings with Friends and Family:   . Attends Religious Services:   . Active Member of Clubs or Organizations:   . Attends Archivist Meetings:   Marland Kitchen Marital Status:   Intimate Partner Violence:   . Fear of Current or Ex-Partner:   . Emotionally Abused:   Marland Kitchen Physically Abused:   . Sexually Abused:     Outpatient Medications Prior to Visit  Medication Sig Dispense Refill  . citalopram (CELEXA) 20 MG tablet Take 1.5 tablets (30 mg total) by mouth daily. 135 tablet 2  . cyclobenzaprine (FLEXERIL) 10 MG tablet Take 10 mg by mouth 3 (three) times daily as needed for muscle spasms.   3  . lidocaine (LIDODERM) 5 %     . Multiple Vitamin (MULTIVITAMIN WITH MINERALS) TABS tablet Take 1 tablet by mouth daily.    . nortriptyline (PAMELOR) 10 MG capsule TAKE 1 CAPSULE(10 MG) BY MOUTH AT BEDTIME 90 capsule 0  . QUEtiapine (SEROQUEL) 50 MG tablet Take 1 tablet (50 mg total) by mouth at bedtime. 30 tablet 2  . simvastatin (ZOCOR) 40 MG tablet TAKE 1 TABLET(40 MG) BY MOUTH EVERY DAY IN THE EVENING 90 tablet 2  . tamsulosin (FLOMAX) 0.4 MG CAPS capsule Take 0.4 mg by mouth at bedtime.    . dicyclomine (BENTYL) 20 MG tablet Take 20 mg by mouth every 6 (six) hours.    Marland Kitchen omeprazole (PRILOSEC) 20 MG capsule Take 20 mg by mouth daily.    Marland Kitchen omeprazole (PRILOSEC) 40 MG capsule Take 40 mg by mouth daily.  6  . HYDROcodone-acetaminophen (NORCO/VICODIN) 5-325 MG tablet Take 1 tablet by mouth every 8 (eight) hours. 30 tablet 0   No facility-administered medications prior to visit.    Allergies  Allergen Reactions  . Tramadol Rash and Other (See Comments)    dizzy    ROS Review of Systems  Constitutional: Negative.   HENT: Negative.   Eyes: Negative.   Respiratory: Negative.   Cardiovascular: Negative.   Gastrointestinal: Positive for abdominal pain.  Genitourinary: Negative.   Psychiatric/Behavioral: Negative.       Objective:    Physical Exam  BP 130/80   Pulse  90   Temp (!) 97.4 F (36.3 C)   Resp 16   Ht 5\' 10"  (1.778 m)   Wt 176 lb 9.6 oz (80.1 kg)   SpO2 98%   BMI 25.34 kg/m  Wt Readings from Last 3 Encounters:  09/18/19 176 lb 9.6 oz (80.1 kg)  06/15/19 182 lb 6.4 oz (82.7 kg)  05/23/19 186 lb (84.4 kg)     Health Maintenance Due  Topic  Date Due  . Hepatitis C Screening  Never done  . COVID-19 Vaccine (1) Never done  . HIV Screening  Never done  . TETANUS/TDAP  Never done  . COLONOSCOPY  Never done    There are no preventive care reminders to display for this patient.  No results found for: TSH Lab Results  Component Value Date   WBC 4.7 02/19/2016   HGB 15.6 02/19/2016   HCT 45.0 02/19/2016   MCV 94.5 02/19/2016   PLT 148 (L) 02/19/2016   Lab Results  Component Value Date   NA 137 02/19/2016   K 4.0 02/19/2016   CO2 29 02/19/2016   GLUCOSE 97 02/19/2016   BUN 9 02/19/2016   CREATININE 1.07 02/19/2016   BILITOT 0.7 02/19/2016   ALKPHOS 65 02/19/2016   AST 19 02/19/2016   ALT 16 (L) 02/19/2016   PROT 6.9 02/19/2016   ALBUMIN 4.2 02/19/2016   CALCIUM 9.4 02/19/2016   ANIONGAP 8 02/19/2016   No results found for: CHOL No results found for: HDL No results found for: LDLCALC No results found for: TRIG No results found for: CHOLHDL No results found for: HGBA1C    Assessment & Plan:   Abdominal pain: Patient complains of 2 months of RUQ and suprapubic pain with borborygmi and cramps with diarrhea.  He is able to eat.  We will increased omeprazole from 20mg  to 80mg  a day.  Start bentyl and get CTscan on abdomen and pelvis with contrast.  With continued symptoms- refer to neurology again per his request. He may need GI referral. These symptoms oar also consistent with narcotic withdrawal but he denies using this but using imodium.  Back pain:   This is chronic and he was sent to pain clinic several times but patient did not go and is making excuses.  I will not give narcotics today  We will refer one more  times, neurology may recommend surgery on spine.  Meds ordered this encounter  Medications  . omeprazole (PRILOSEC) 40 MG capsule    Sig: Take 1 capsule (40 mg total) by mouth in the morning and at bedtime.    Dispense:  20 capsule    Refill:  3  . dicyclomine (BENTYL) 20 MG tablet    Sig: Take 1 tablet (20 mg total) by mouth every 6 (six) hours.    Dispense:  90 tablet    Refill:  3    Follow-up: Return in about 2 weeks (around 10/02/2019).    Reinaldo Meeker, MD   Patient called back again to get narcotics.  We refused and am referring him to pain clinic.  I will consider suboxone clinic.

## 2019-09-20 DIAGNOSIS — G8929 Other chronic pain: Secondary | ICD-10-CM | POA: Diagnosis not present

## 2019-09-20 DIAGNOSIS — Z79899 Other long term (current) drug therapy: Secondary | ICD-10-CM | POA: Diagnosis not present

## 2019-09-20 DIAGNOSIS — R0602 Shortness of breath: Secondary | ICD-10-CM | POA: Diagnosis not present

## 2019-09-20 DIAGNOSIS — R079 Chest pain, unspecified: Secondary | ICD-10-CM | POA: Diagnosis not present

## 2019-09-20 DIAGNOSIS — M546 Pain in thoracic spine: Secondary | ICD-10-CM | POA: Diagnosis not present

## 2019-09-20 DIAGNOSIS — F419 Anxiety disorder, unspecified: Secondary | ICD-10-CM | POA: Diagnosis not present

## 2019-09-20 DIAGNOSIS — K219 Gastro-esophageal reflux disease without esophagitis: Secondary | ICD-10-CM | POA: Diagnosis not present

## 2019-09-20 DIAGNOSIS — F17210 Nicotine dependence, cigarettes, uncomplicated: Secondary | ICD-10-CM | POA: Diagnosis not present

## 2019-09-28 DIAGNOSIS — M2578 Osteophyte, vertebrae: Secondary | ICD-10-CM | POA: Diagnosis not present

## 2019-09-28 DIAGNOSIS — M961 Postlaminectomy syndrome, not elsewhere classified: Secondary | ICD-10-CM | POA: Insufficient documentation

## 2019-09-28 DIAGNOSIS — Z0289 Encounter for other administrative examinations: Secondary | ICD-10-CM | POA: Insufficient documentation

## 2019-09-28 DIAGNOSIS — Z0189 Encounter for other specified special examinations: Secondary | ICD-10-CM | POA: Diagnosis not present

## 2019-09-28 DIAGNOSIS — M546 Pain in thoracic spine: Secondary | ICD-10-CM

## 2019-09-28 DIAGNOSIS — Z981 Arthrodesis status: Secondary | ICD-10-CM

## 2019-09-28 DIAGNOSIS — M542 Cervicalgia: Secondary | ICD-10-CM | POA: Diagnosis not present

## 2019-09-28 DIAGNOSIS — G8929 Other chronic pain: Secondary | ICD-10-CM | POA: Diagnosis not present

## 2019-09-28 HISTORY — DX: Pain in thoracic spine: M54.6

## 2019-09-28 HISTORY — DX: Arthrodesis status: Z98.1

## 2019-09-28 HISTORY — DX: Postlaminectomy syndrome, not elsewhere classified: M96.1

## 2019-10-02 ENCOUNTER — Encounter: Payer: Self-pay | Admitting: Legal Medicine

## 2019-10-02 ENCOUNTER — Ambulatory Visit: Payer: Medicare HMO | Admitting: Legal Medicine

## 2019-10-02 ENCOUNTER — Other Ambulatory Visit: Payer: Self-pay | Admitting: Legal Medicine

## 2019-10-11 DIAGNOSIS — R1032 Left lower quadrant pain: Secondary | ICD-10-CM | POA: Diagnosis not present

## 2019-10-11 DIAGNOSIS — I700 Atherosclerosis of aorta: Secondary | ICD-10-CM | POA: Diagnosis not present

## 2019-10-11 DIAGNOSIS — K5730 Diverticulosis of large intestine without perforation or abscess without bleeding: Secondary | ICD-10-CM | POA: Diagnosis not present

## 2019-10-11 DIAGNOSIS — K4020 Bilateral inguinal hernia, without obstruction or gangrene, not specified as recurrent: Secondary | ICD-10-CM | POA: Diagnosis not present

## 2019-10-11 DIAGNOSIS — K6389 Other specified diseases of intestine: Secondary | ICD-10-CM | POA: Diagnosis not present

## 2019-10-25 ENCOUNTER — Other Ambulatory Visit: Payer: Self-pay | Admitting: Legal Medicine

## 2019-10-27 DIAGNOSIS — M5441 Lumbago with sciatica, right side: Secondary | ICD-10-CM | POA: Diagnosis not present

## 2019-10-27 DIAGNOSIS — Z9889 Other specified postprocedural states: Secondary | ICD-10-CM | POA: Diagnosis not present

## 2019-10-27 DIAGNOSIS — N3289 Other specified disorders of bladder: Secondary | ICD-10-CM | POA: Diagnosis not present

## 2019-10-27 DIAGNOSIS — I700 Atherosclerosis of aorta: Secondary | ICD-10-CM | POA: Diagnosis not present

## 2019-10-27 DIAGNOSIS — M545 Low back pain: Secondary | ICD-10-CM | POA: Diagnosis not present

## 2019-10-27 DIAGNOSIS — K5730 Diverticulosis of large intestine without perforation or abscess without bleeding: Secondary | ICD-10-CM | POA: Diagnosis not present

## 2019-10-27 DIAGNOSIS — R109 Unspecified abdominal pain: Secondary | ICD-10-CM | POA: Diagnosis not present

## 2019-10-27 DIAGNOSIS — M5416 Radiculopathy, lumbar region: Secondary | ICD-10-CM | POA: Diagnosis not present

## 2019-10-31 DIAGNOSIS — Z79891 Long term (current) use of opiate analgesic: Secondary | ICD-10-CM | POA: Diagnosis not present

## 2019-10-31 DIAGNOSIS — Z79899 Other long term (current) drug therapy: Secondary | ICD-10-CM | POA: Diagnosis not present

## 2019-10-31 DIAGNOSIS — M546 Pain in thoracic spine: Secondary | ICD-10-CM | POA: Diagnosis not present

## 2019-10-31 DIAGNOSIS — G8929 Other chronic pain: Secondary | ICD-10-CM | POA: Diagnosis not present

## 2019-10-31 DIAGNOSIS — Z981 Arthrodesis status: Secondary | ICD-10-CM | POA: Diagnosis not present

## 2019-10-31 DIAGNOSIS — M961 Postlaminectomy syndrome, not elsewhere classified: Secondary | ICD-10-CM | POA: Diagnosis not present

## 2019-10-31 DIAGNOSIS — Z760 Encounter for issue of repeat prescription: Secondary | ICD-10-CM | POA: Diagnosis not present

## 2019-11-01 ENCOUNTER — Other Ambulatory Visit: Payer: Self-pay

## 2019-11-01 MED ORDER — SIMVASTATIN 40 MG PO TABS: 40.0000 mg | ORAL_TABLET | Freq: Every day | ORAL | 2 refills | Status: DC

## 2019-11-07 ENCOUNTER — Other Ambulatory Visit: Payer: Self-pay

## 2019-11-07 MED ORDER — SIMVASTATIN 40 MG PO TABS: 40.0000 mg | ORAL_TABLET | Freq: Every day | ORAL | 2 refills | Status: DC

## 2019-12-28 ENCOUNTER — Other Ambulatory Visit: Payer: Self-pay | Admitting: Legal Medicine

## 2019-12-28 DIAGNOSIS — K2100 Gastro-esophageal reflux disease with esophagitis, without bleeding: Secondary | ICD-10-CM

## 2020-03-05 ENCOUNTER — Other Ambulatory Visit: Payer: Self-pay | Admitting: Legal Medicine

## 2020-03-05 ENCOUNTER — Encounter: Payer: Self-pay | Admitting: Legal Medicine

## 2020-03-05 ENCOUNTER — Telehealth (INDEPENDENT_AMBULATORY_CARE_PROVIDER_SITE_OTHER): Payer: Medicare HMO | Admitting: Legal Medicine

## 2020-03-05 VITALS — Ht 70.0 in | Wt 176.9 lb

## 2020-03-05 DIAGNOSIS — M4316 Spondylolisthesis, lumbar region: Secondary | ICD-10-CM | POA: Diagnosis not present

## 2020-03-05 DIAGNOSIS — M5136 Other intervertebral disc degeneration, lumbar region: Secondary | ICD-10-CM

## 2020-03-05 DIAGNOSIS — J019 Acute sinusitis, unspecified: Secondary | ICD-10-CM

## 2020-03-05 DIAGNOSIS — J329 Chronic sinusitis, unspecified: Secondary | ICD-10-CM

## 2020-03-05 HISTORY — DX: Chronic sinusitis, unspecified: J32.9

## 2020-03-05 MED ORDER — AMOXICILLIN-POT CLAVULANATE 875-125 MG PO TABS: 1.0000 | ORAL_TABLET | Freq: Two times a day (BID) | ORAL | 0 refills | Status: DC

## 2020-03-05 MED ORDER — PREDNISONE 10 MG (21) PO TBPK: ORAL_TABLET | ORAL | 0 refills | Status: DC

## 2020-03-05 NOTE — Progress Notes (Signed)
Virtual Visit via Telephone Note   This visit type was conducted due to national recommendations for restrictions regarding the COVID-19 Pandemic (e.g. social distancing) in an effort to limit this patient's exposure and mitigate transmission in our community.  Due to his co-morbid illnesses, this patient is at least at moderate risk for complications without adequate follow up.  This format is felt to be most appropriate for this patient at this time.  The patient did not have access to video technology/had technical difficulties with video requiring transitioning to audio format only (telephone).  All issues noted in this document were discussed and addressed.  No physical exam could be performed with this format.  Patient verbally consented to a telehealth visit.   Date:  03/05/2020   ID:  Clarence Davis, DOB Oct 14, 1960, MRN [id number]  Patient Location: Home Provider Location: Office/Clinic  PCP:  Clarence Anes, MD   Evaluation Performed:  New Patient Evaluation  Chief Complaint:  Patient has sneezing he is having sinus congestion, mild temp.  History of Present Illness:    Clarence Davis is a 59 y.o. male with Patient has sneezing he is having sinus congestion, mild temp. He just got home from [place].  Sisters husband died of covid.   The patient does not have symptoms concerning for COVID-19 infection (fever, chills, cough, or new shortness of breath).    Past Medical History:  Diagnosis Date  . Alcoholic ([place])    quit drinking many yrs ago per pt  . Anxiety   . Arthritis    "hands" (04/05/2015)  . Bilateral carpal tunnel syndrome   . Chronic back pain    "all over"  . Chronic diarrhea   . Depression    takes Citalopram daily  . Dry skin   . Dysrhythmia    PVC's   . GERD (gastroesophageal reflux disease)    takes Omeprazole daily  . Head injury    early 29's  . Headache    "weekly" (04/05/2015)  . Hepatitis C    "treated w/RX; don't have it anymore"  (04/05/2015)    Past Surgical History:  Procedure Laterality Date  . ANTERIOR CERVICAL DECOMP/DISCECTOMY FUSION N/A 12/15/2013   Procedure: CERVICAL FOUR TO FIVE, CERVICAL FIVE TO SIX, CERVICAL SIX TO SEVEN CERVICAL DECOMPRESSION/DISCECTOMY FUSION 3 LEVELS;  Surgeon: Floyce Stakes, MD;  Location: [facility];  Service: Neurosurgery;  Laterality: N/A;  C4-5 C5-6 C6-7 Anterior cervical decompression/diskectomy/fusion  . BACK SURGERY    . CLOSED REDUCTION SHOULDER DISLOCATION Left 1975   patient denies  . ESOPHAGOGASTRODUODENOSCOPY    . HERNIA REPAIR    . INGUINAL HERNIA REPAIR Left X 3   "  . POSTERIOR CERVICAL FUSION/FORAMINOTOMY  04/05/2015  . POSTERIOR CERVICAL FUSION/FORAMINOTOMY N/A 04/05/2015   Procedure: Cervical three-four, Cervical four-five, Cervical five-six, Cervical six-seven Posterior cervical fusion with lateral mass fixation;  Surgeon: Leeroy Cha, MD;  Location: [facility];  Service: Neurosurgery;  Laterality: N/A;  C3-4 C4-5 C5-6 C6-7 Posterior cervical fusion with lateral mass fixation  . UMBILICAL HERNIA REPAIR    . WISDOM TOOTH EXTRACTION      Family History  Problem Relation Age of Onset  . Throat cancer Father   . Heart disease Sister     Social History   Socioeconomic History  . Marital status: Married    Spouse name: Not on file  . Number of children: 2  . Years of education: Not on file  . Highest education level: 12th grade  Occupational History  . Occupation: disabled  Tobacco Use  . Smoking status: Current Some Day Smoker    Packs/day: 0.15    Years: 28.00    Pack years: 4.20    Types: E-cigarettes, Cigarettes    Last attempt to quit: 02/25/2015    Years since quitting: 5.0  . Smokeless tobacco: Never Used  Substance and Sexual Activity  . Alcohol use: Never    Comment: 04/05/2015 "recovering alcoholic; last drink 40/06/4740"  . Drug use: No  . Sexual activity: Not Currently  Other Topics Concern  . Not on file  Social History  Narrative   Patient is left-handed. He lives with his long time girlfriend Clarence Davis in a one level home home. He drinks 3 cups of coffee and 4 Coca-Colas a day. He tries to walk when able.   Social Determinants of Health   Financial Resource Strain:   . Difficulty of Paying Living Expenses: Not on file  Food Insecurity:   . Worried About Charity fundraiser in the Last Year: Not on file  . Ran Out of Food in the Last Year: Not on file  Transportation Needs:   . Lack of Transportation (Medical): Not on file  . Lack of Transportation (Non-Medical): Not on file  Physical Activity:   . Days of Exercise per Week: Not on file  . Minutes of Exercise per Session: Not on file  Stress:   . Feeling of Stress : Not on file  Social Connections:   . Frequency of Communication with Friends and Family: Not on file  . Frequency of Social Gatherings with Friends and Family: Not on file  . Attends Religious Services: Not on file  . Active Member of Clubs or Organizations: Not on file  . Attends Archivist Meetings: Not on file  . Marital Status: Not on file  Intimate Partner Violence:   . Fear of Current or Ex-Partner: Not on file  . Emotionally Abused: Not on file  . Physically Abused: Not on file  . Sexually Abused: Not on file    Outpatient Medications Prior to Visit  Medication Sig Dispense Refill  . BELBUCA 150 MCG FILM     . buprenorphine (BUTRANS) 10 MCG/HR PTWK     . citalopram (CELEXA) 20 MG tablet Take 1.5 tablets (30 mg total) by mouth daily. 135 tablet 2  . cyclobenzaprine (FLEXERIL) 10 MG tablet Take 10 mg by mouth 3 (three) times daily as needed for muscle spasms.   3  . dicyclomine (BENTYL) 20 MG tablet Take 1 tablet (20 mg total) by mouth every 6 (six) hours. 90 tablet 3  . lidocaine (LIDODERM) 5 %     . Multiple Vitamin (MULTIVITAMIN WITH MINERALS) TABS tablet Take 1 tablet by mouth daily.    . nortriptyline (PAMELOR) 10 MG capsule TAKE 1 CAPSULE(10 MG) BY MOUTH AT  BEDTIME 90 capsule 0  . omeprazole (PRILOSEC) 40 MG capsule TAKE 1 CAPSULE EVERY DAY 90 capsule 2  . QUEtiapine (SEROQUEL) 50 MG tablet Take 1 tablet (50 mg total) by mouth at bedtime. 30 tablet 2  . simvastatin (ZOCOR) 40 MG tablet Take 1 tablet (40 mg total) by mouth daily at 6 PM. 90 tablet 2  . tamsulosin (FLOMAX) 0.4 MG CAPS capsule TAKE 1 CAPSULE  BY MOUTH BEFORE BED. 90 capsule 2   No facility-administered medications prior to visit.    Allergies:   Tramadol   Social History   Tobacco Use  . Smoking status:  Current Some Day Smoker    Packs/day: 0.15    Years: 28.00    Pack years: 4.20    Types: E-cigarettes, Cigarettes    Last attempt to quit: 02/25/2015    Years since quitting: 5.0  . Smokeless tobacco: Never Used  Substance Use Topics  . Alcohol use: Never    Comment: 04/05/2015 "recovering alcoholic; last drink 50/0/9381"  . Drug use: No     Review of Systems  Constitutional: Negative for chills and fever.  HENT: Positive for congestion and sinus pain.   Eyes: Negative for redness.  Respiratory: Positive for cough.   Cardiovascular: Negative for chest pain, palpitations, orthopnea and claudication.  Gastrointestinal: Negative for heartburn and nausea.  Musculoskeletal: Positive for back pain.  Skin: Negative for rash.  Neurological: Negative.      Labs/Other Tests and Data Reviewed:    Recent Labs: No results found for requested labs within last 8760 hours.   Recent Lipid Panel No results found for: CHOL, TRIG, HDL, CHOLHDL, LDLCALC, LDLDIRECT  Wt Readings from Last 3 Encounters:  03/05/20 176 lb 14.4 oz (80.2 kg)  09/18/19 176 lb 9.6 oz (80.1 kg)  06/15/19 182 lb 6.4 oz (82.7 kg)     Objective:    Vital Signs:  Ht 5\' 10"  (1.778 m)   Wt 176 lb 14.4 oz (80.2 kg)   BMI 25.38 kg/m    Physical Exam na  ASSESSMENT & PLAN:   Diagnoses and all orders for this visit: Acute non-recurrent sinusitis, unspecified location -     amoxicillin-clavulanate  (AUGMENTIN) 875-125 MG tablet; Take 1 tablet by mouth 2 (two) times daily. -     predniSONE (STERAPRED UNI-PAK 21 TAB) 10 MG (21) TBPK tablet; Take 6ills first day , then 5 pills day 2 and then cut down one pill day until gone   No orders of the defined types were placed in this encounter.    No orders of the defined types were placed in this encounter.   COVID-19 Education: The signs and symptoms of COVID-19 were discussed with the patient and how to seek care for testing (follow up with PCP or arrange E-visit). The importance of social distancing was discussed today.   I spent 20 minutes dedicated to the care of this patient on the date of this encounter to include face-to-face time with the patient, as well as: Apparently patient was in [place] with his sister whose husband died of Covid and he missed his pain clinic appointment now they will see him again and he is calling for narcotics which we have discussed with him multiple times and we will not be giving any more narcotics.  I will refer him to see Dr. Donivan Scull for his back strongly recommended that he discuss his case with his pain clinic.  He is not satisfied with his present care because he demands to have some pain medicine for his back which is bothering him since he ran out of the medicines from the pain clinic.  I did call in prednisone for sinuses which hopefully will help his back but in the past the orthopedist and neurologist have not found a surgical cause of his pain.  He neurostimulator may help him.  Follow Up:  In Person prn  Signed,  Reinaldo Meeker, MD  03/05/2020 11:47 AM    [place]

## 2020-03-31 ENCOUNTER — Other Ambulatory Visit: Payer: Self-pay | Admitting: Legal Medicine

## 2020-04-16 ENCOUNTER — Telehealth: Payer: Self-pay | Admitting: Legal Medicine

## 2020-04-16 NOTE — Chronic Care Management (AMB) (Signed)
  Chronic Care Management   Note  04/16/2020 Name: [name] MRN: [id number] DOB: 03-Jan-1961  Clarence Davis is a 60 y.o. year old male who is a primary care patient of Lillard Anes, MD. I reached out to [name] by phone today in response to a referral sent by [name]'s PCP, Lillard Anes, MD.   [name] was given information about Chronic Care Management services today including:  1. CCM service includes personalized support from designated clinical staff supervised by his physician, including individualized plan of care and coordination with other care providers 2. 24/7 contact phone numbers for assistance for urgent and routine care needs. 3. Service will only be billed when office clinical staff spend 20 minutes or more in a month to coordinate care. 4. Only one practitioner may furnish and bill the service in a calendar month. 5. The patient may stop CCM services at any time (effective at the end of the month) by phone call to the office staff.   Patient agreed to services and verbal consent obtained.   Follow up plan:   [name]

## 2020-04-18 DIAGNOSIS — Z03818 Encounter for observation for suspected exposure to other biological agents ruled out: Secondary | ICD-10-CM | POA: Diagnosis not present

## 2020-04-18 DIAGNOSIS — Z20822 Contact with and (suspected) exposure to covid-19: Secondary | ICD-10-CM | POA: Diagnosis not present

## 2020-05-14 ENCOUNTER — Encounter: Payer: Self-pay | Admitting: Legal Medicine

## 2020-05-14 ENCOUNTER — Telehealth (INDEPENDENT_AMBULATORY_CARE_PROVIDER_SITE_OTHER): Payer: Medicare HMO | Admitting: Legal Medicine

## 2020-05-14 VITALS — Ht 70.0 in | Wt 190.0 lb

## 2020-05-14 DIAGNOSIS — J449 Chronic obstructive pulmonary disease, unspecified: Secondary | ICD-10-CM

## 2020-05-14 MED ORDER — PREDNISONE 10 MG (21) PO TBPK: ORAL_TABLET | ORAL | 0 refills | Status: DC

## 2020-05-14 MED ORDER — LEVOFLOXACIN 500 MG PO TABS: 500.0000 mg | ORAL_TABLET | Freq: Every day | ORAL | 0 refills | Status: DC

## 2020-05-14 NOTE — Progress Notes (Signed)
Virtual Visit via Telephone Note   This visit type was conducted due to national recommendations for restrictions regarding the COVID-19 Pandemic (e.g. social distancing) in an effort to limit this patient's exposure and mitigate transmission in our community.  Due to his co-morbid illnesses, this patient is at least at moderate risk for complications without adequate follow up.  This format is felt to be most appropriate for this patient at this time.  The patient did not have access to video technology/had technical difficulties with video requiring transitioning to audio format only (telephone).  All issues noted in this document were discussed and addressed.  No physical exam could be performed with this format.  Patient verbally consented to a telehealth visit.   Date:  05/14/2020   ID:  Clarence Davis, DOB 1960-08-23, MRN [id number]  Patient Location: Home Provider Location: Office/Clinic  PCP:  Clarence Anes, MD   Evaluation Performed:  New Patient Evaluation  Chief Complaint:  Sick for 2 days  History of Present Illness:    Clarence Davis is a 60 y.o. male with sick, sneezing, cough, 2 days, no fever, had all immunizations  The patient does not have symptoms concerning for COVID-19 infection (fever, chills, cough, or new shortness of breath).    Past Medical History:  Diagnosis Date  . Alcoholic ([place])    quit drinking many yrs ago per pt  . Anxiety   . Arthritis    "hands" (04/05/2015)  . Bilateral carpal tunnel syndrome   . Chronic back pain    "all over"  . Chronic diarrhea   . Depression    takes Citalopram daily  . Dry skin   . Dysrhythmia    PVC's   . GERD (gastroesophageal reflux disease)    takes Omeprazole daily  . Head injury    early 7's  . Headache    "weekly" (04/05/2015)  . Hepatitis C    "treated w/RX; don't have it anymore" (04/05/2015)    Past Surgical History:  Procedure Laterality Date  . ANTERIOR CERVICAL DECOMP/DISCECTOMY  FUSION N/A 12/15/2013   Procedure: CERVICAL FOUR TO FIVE, CERVICAL FIVE TO SIX, CERVICAL SIX TO SEVEN CERVICAL DECOMPRESSION/DISCECTOMY FUSION 3 LEVELS;  Surgeon: Clarence Stakes, MD;  Location: [facility];  Service: Neurosurgery;  Laterality: N/A;  C4-5 C5-6 C6-7 Anterior cervical decompression/diskectomy/fusion  . BACK SURGERY    . CLOSED REDUCTION SHOULDER DISLOCATION Left 1975   patient denies  . ESOPHAGOGASTRODUODENOSCOPY    . HERNIA REPAIR    . INGUINAL HERNIA REPAIR Left X 3   "  . POSTERIOR CERVICAL FUSION/FORAMINOTOMY  04/05/2015  . POSTERIOR CERVICAL FUSION/FORAMINOTOMY N/A 04/05/2015   Procedure: Cervical three-four, Cervical four-five, Cervical five-six, Cervical six-seven Posterior cervical fusion with lateral mass fixation;  Surgeon: Clarence Cha, MD;  Location: [facility];  Service: Neurosurgery;  Laterality: N/A;  C3-4 C4-5 C5-6 C6-7 Posterior cervical fusion with lateral mass fixation  . UMBILICAL HERNIA REPAIR    . WISDOM TOOTH EXTRACTION      Family History  Problem Relation Age of Onset  . Throat cancer Father   . Heart disease Sister     Social History   Socioeconomic History  . Marital status: Married    Spouse name: Not on file  . Number of children: 2  . Years of education: Not on file  . Highest education level: 12th grade  Occupational History  . Occupation: disabled  Tobacco Use  . Smoking status: Current Some Day Smoker  Packs/day: 0.15    Years: 28.00    Pack years: 4.20    Types: E-cigarettes, Cigarettes    Last attempt to quit: 02/25/2015    Years since quitting: 5.2  . Smokeless tobacco: Never Used  Substance and Sexual Activity  . Alcohol use: Never    Comment: 04/05/2015 "recovering alcoholic; last drink 27/08/1698"  . Drug use: No  . Sexual activity: Not Currently  Other Topics Concern  . Not on file  Social History Narrative   Patient is left-handed. He lives with his long time girlfriend Clarence Davis in a one level home home.  He drinks 3 cups of coffee and 4 Coca-Colas a day. He tries to walk when able.   Social Determinants of Health   Financial Resource Strain: Not on file  Food Insecurity: Not on file  Transportation Needs: Not on file  Physical Activity: Not on file  Stress: Not on file  Social Connections: Not on file  Intimate Partner Violence: Not on file    Outpatient Medications Prior to Visit  Medication Sig Dispense Refill  . BELBUCA 150 MCG FILM     . buprenorphine (BUTRANS) 10 MCG/HR PTWK     . citalopram (CELEXA) 20 MG tablet TAKE 1 AND 1/2 TABLETS (30MG ) EVERY DAY 135 tablet 2  . cyclobenzaprine (FLEXERIL) 10 MG tablet TAKE 1 TABLET TWICE DAILY  TO THREE TIMES DAILY AS NEEDED FOR MUSCLE SPASM(S) 60 tablet 3  . diclofenac (VOLTAREN) 50 MG EC tablet Take by mouth.    . dicyclomine (BENTYL) 20 MG tablet Take 1 tablet (20 mg total) by mouth every 6 (six) hours. 90 tablet 3  . lidocaine (LIDODERM) 5 %     . Multiple Vitamin (MULTIVITAMIN WITH MINERALS) TABS tablet Take 1 tablet by mouth daily.    . nortriptyline (PAMELOR) 10 MG capsule TAKE 1 CAPSULE(10 MG) BY MOUTH AT BEDTIME 90 capsule 0  . omeprazole (PRILOSEC) 40 MG capsule TAKE 1 CAPSULE EVERY DAY 90 capsule 2  . QUEtiapine (SEROQUEL) 50 MG tablet Take 1 tablet (50 mg total) by mouth at bedtime. 30 tablet 2  . simvastatin (ZOCOR) 40 MG tablet Take 1 tablet (40 mg total) by mouth daily at 6 PM. 90 tablet 2  . tamsulosin (FLOMAX) 0.4 MG CAPS capsule TAKE 1 CAPSULE  BY MOUTH BEFORE BED. 90 capsule 2  . amoxicillin-clavulanate (AUGMENTIN) 875-125 MG tablet Take 1 tablet by mouth 2 (two) times daily. 20 tablet 0  . predniSONE (STERAPRED UNI-PAK 21 TAB) 10 MG (21) TBPK tablet Take 6ills first day , then 5 pills day 2 and then cut down one pill day until gone 21 tablet 0   No facility-administered medications prior to visit.    Allergies:   Tramadol   Social History   Tobacco Use  . Smoking status: Current Some Day Smoker    Packs/day: 0.15     Years: 28.00    Pack years: 4.20    Types: E-cigarettes, Cigarettes    Last attempt to quit: 02/25/2015    Years since quitting: 5.2  . Smokeless tobacco: Never Used  Substance Use Topics  . Alcohol use: Never    Comment: 04/05/2015 "recovering alcoholic; last drink 17/07/9447"  . Drug use: No     Review of Systems  Constitutional: Negative for fever and malaise/fatigue.  HENT: Positive for congestion and sinus pain.   Eyes: Negative for redness.  Respiratory: Positive for cough.   Cardiovascular: Negative for chest pain, claudication and PND.  Gastrointestinal: Negative  for melena.  Genitourinary: Negative.   Musculoskeletal: Negative.   Skin: Negative for itching.  Neurological: Negative.   Psychiatric/Behavioral: Negative.      Labs/Other Tests and Data Reviewed:    Recent Labs: No results found for requested labs within last 8760 hours.   Recent Lipid Panel No results found for: CHOL, TRIG, HDL, CHOLHDL, LDLCALC, LDLDIRECT  Wt Readings from Last 3 Encounters:  05/14/20 190 lb (86.2 kg)  03/05/20 176 lb 14.4 oz (80.2 kg)  09/18/19 176 lb 9.6 oz (80.1 kg)     Objective:    Vital Signs:  Ht 5\' 10"  (1.778 m)   Wt 190 lb (86.2 kg)   BMI 27.26 kg/m    Physical Exam vs stable  ASSESSMENT & PLAN:   Diagnoses and all orders for this visit: Obstructive chronic bronchitis without exacerbation (HCC) -     levofloxacin (LEVAQUIN) 500 MG tablet; Take 1 tablet (500 mg total) by mouth daily. -     predniSONE (STERAPRED UNI-PAK 21 TAB) 10 MG (21) TBPK tablet; Take 6ills first day , then 5 pills day 2 and then cut down one pill day until gone        COVID-19 Education: The signs and symptoms of COVID-19 were discussed with the patient and how to seek care for testing (follow up with PCP or arrange E-visit). The importance of social distancing was discussed today.   I spent 20 minutes dedicated to the care of this patient on the date of this encounter to include  face-to-face time with the patient, as well as:  follow Up:  In Person prn  Signed,  Reinaldo Meeker, MD  05/14/2020 11:20 AM    [place]

## 2020-05-15 ENCOUNTER — Encounter: Payer: Self-pay | Admitting: Legal Medicine

## 2020-05-24 ENCOUNTER — Telehealth: Payer: Self-pay

## 2020-05-24 NOTE — Progress Notes (Signed)
Chronic Care Management Pharmacy Assistant   Name: Clarence Davis  MRN: [id number] DOB: 11/25/1960  Reason for Encounter: Initial questions for Clarence Davis, CPP   PCP : Lillard Anes, MD  Allergies:   Allergies  Allergen Reactions  . Tramadol Rash and Other (See Comments)    dizzy Other reaction(s): Other (See Comments) dizzy dizzy    Medications: Outpatient Encounter Medications as of 05/24/2020  Medication Sig  . BELBUCA 150 MCG FILM   . buprenorphine (BUTRANS) 10 MCG/HR PTWK   . citalopram (CELEXA) 20 MG tablet TAKE 1 AND 1/2 TABLETS (30MG ) EVERY DAY  . cyclobenzaprine (FLEXERIL) 10 MG tablet TAKE 1 TABLET TWICE DAILY  TO THREE TIMES DAILY AS NEEDED FOR MUSCLE SPASM(S)  . diclofenac (VOLTAREN) 50 MG EC tablet Take by mouth.  . dicyclomine (BENTYL) 20 MG tablet Take 1 tablet (20 mg total) by mouth every 6 (six) hours.  Marland Kitchen levofloxacin (LEVAQUIN) 500 MG tablet Take 1 tablet (500 mg total) by mouth daily.  Marland Kitchen lidocaine (LIDODERM) 5 %   . Multiple Vitamin (MULTIVITAMIN WITH MINERALS) TABS tablet Take 1 tablet by mouth daily.  . nortriptyline (PAMELOR) 10 MG capsule TAKE 1 CAPSULE(10 MG) BY MOUTH AT BEDTIME  . omeprazole (PRILOSEC) 40 MG capsule TAKE 1 CAPSULE EVERY DAY  . predniSONE (STERAPRED UNI-PAK 21 TAB) 10 MG (21) TBPK tablet Take 6ills first day , then 5 pills day 2 and then cut down one pill day until gone  . QUEtiapine (SEROQUEL) 50 MG tablet Take 1 tablet (50 mg total) by mouth at bedtime.  . simvastatin (ZOCOR) 40 MG tablet Take 1 tablet (40 mg total) by mouth daily at 6 PM.  . tamsulosin (FLOMAX) 0.4 MG CAPS capsule TAKE 1 CAPSULE  BY MOUTH BEFORE BED.   No facility-administered encounter medications on file as of 05/24/2020.    Current Diagnosis: Patient Active Problem List   Diagnosis Date Noted  . Sinusitis 03/05/2020  . PTSD (post-traumatic stress disorder) 06/15/2019  . OSA (obstructive sleep apnea) 06/15/2019  . Inguinal hernia, right  05/23/2019  . Primary generalized (osteo)arthritis 05/22/2019  . Enlarged prostate with urinary obstruction 05/22/2019  . Left rotator cuff tear 05/09/2019  . Obstructive chronic bronchitis without exacerbation ([place]) 05/08/2019  . Lumbar adjacent segment disease with spondylolisthesis 02/25/2016  . Cervical pseudoarthrosis ([place]) 04/05/2015  . Cervical stenosis of spinal canal 12/15/2013    Have you seen any other providers since your last visit? No  Any changes in your medications or health? 05/14/20- PCP started Levofloxacin 500mg , patient stated he is having increased back pain due to a previous injury, he has 18 bolts between his neck and back.  He went to pain management before, but with Covid things got delayed. He stated he has asked Dr. Henrene Pastor for something to manage his pain and Dr. Henrene Pastor declined.  He uses the hot shower and said he has a heat suit that he uses to try and get some relief.  Patient stated he is having leakage in his lungs, and needs to quit smoking.   Any side effects from any medications? He is not having any noted side effects.  Do you have an symptoms or problems not managed by your medications? No unmanaged symptoms noted.  Any concerns about your health right now? He is concerned about his increased back issues.  Has your provider asked that you check blood pressure, blood sugar, or follow special diet at home? Patient is not checking blood pressure, blood sugar and  has no special diet.  Do you get any type of exercise on a regular basis? Patient does not exercise due to his back.  Can you think of a goal you would like to reach for your health? Patient stated he would like a better quality of life.  Do you have any problems getting your medications? Patient has no issues getting his medication.  Is there anything that you would like to discuss during the appointment? Patient doesn't really have anything.  Patient knows to have his medications near  by.  Follow-Up:  Pharmacist Review  Clarence Davis, CPP notified  Clarita Leber, [place] Pharmacist Assistant [phone]

## 2020-05-24 NOTE — Progress Notes (Deleted)
Chronic Care Management Pharmacy Note  05/24/2020 Name:  Jermani Eberlein MRN:  [id number] DOB:  10-28-1960  Subjective: Clarence Davis is an 60 y.o. year old male who is a primary patient of Henrene Pastor, Zeb Comfort, MD.  The CCM team was consulted for assistance with disease management and care coordination needs.    {CCMTELEPHONEFACETOFACE:21091510} for initial visit in response to provider referral for pharmacy case management and/or care coordination services.   Consent to Services:  The patient was given the following information about Chronic Care Management services today, agreed to services, and gave verbal consent: 1. CCM service includes personalized support from designated clinical staff supervised by the primary care provider, including individualized plan of care and coordination with other care providers 2. 24/7 contact phone numbers for assistance for urgent and routine care needs. 3. Service will only be billed when office clinical staff spend 20 minutes or more in a month to coordinate care. 4. Only one practitioner may furnish and bill the service in a calendar month. 5.The patient may stop CCM services at any time (effective at the end of the month) by phone call to the office staff. 6. The patient will be responsible for cost sharing (co-pay) of up to 20% of the service fee (after annual deductible is met). Patient agreed to services and consent obtained.  Patient Care Team: Lillard Anes, MD as PCP - General (Family Medicine) Pieter Partridge, DO as Consulting Physician (Neurology) Burnice [place], [facility] as Pharmacist (Pharmacist)  Recent office visits: 05/14/2020 - obstructive chronic bronchitis without exacerbation - levaquin and prednisone.  03/05/2020 - nasal congestion/sinusitis augmentin and prednisone ordered. Referral to ortho surgery.  Recent consult visits: [facility] visits: None in previous 6 months  Objective:  Lab Results  Component Value Date    CREATININE 1.07 02/19/2016   BUN 9 02/19/2016   GFRNONAA >60 02/19/2016   GFRAA >60 02/19/2016   NA 137 02/19/2016   K 4.0 02/19/2016   CALCIUM 9.4 02/19/2016   CO2 29 02/19/2016    No results found for: HGBA1C, FRUCTOSAMINE, GFR, MICROALBUR  Last diabetic Eye exam: No results found for: HMDIABEYEEXA  Last diabetic Foot exam: No results found for: HMDIABFOOTEX   No results found for: CHOL, HDL, LDLCALC, LDLDIRECT, TRIG, CHOLHDL  Hepatic Function Latest Ref Rng & Units 02/19/2016 04/02/2015 12/16/2012  Total Protein 6.5 - 8.1 g/dL 6.9 6.7 7.2  Albumin 3.5 - 5.0 g/dL 4.2 4.1 3.7  AST 15 - 41 U/L 19 20 39(H)  ALT 17 - 63 U/L 16(L) 18 39  Alk Phosphatase 38 - 126 U/L 65 63 66  Total Bilirubin 0.3 - 1.2 mg/dL 0.7 0.6 0.3    No results found for: TSH, FREET4  CBC Latest Ref Rng & Units 02/19/2016 04/02/2015 12/15/2013  WBC 4.0 - 10.5 K/uL 4.7 5.5 6.4  Hemoglobin 13.0 - 17.0 g/dL 15.6 14.8 15.3  Hematocrit 39.0 - 52.0 % 45.0 43.5 44.6  Platelets 150 - 400 K/uL 148(L) 149(L) 161    No results found for: VD25OH  Clinical ASCVD: No  The ASCVD Risk score Mikey Bussing DC Jr., et al., 2013) failed to calculate for the following reasons:   Cannot find a previous HDL lab   Cannot find a previous total cholesterol lab    No flowsheet data found.   ***Other: (CHADS2VASc if Afib, MMRC or CAT for COPD, ACT, DEXA)  Social History   Tobacco Use  Smoking Status Current Some Day Smoker  . Packs/day: 0.15  .  Years: 28.00  . Pack years: 4.20  . Types: E-cigarettes, Cigarettes  . Last attempt to quit: 02/25/2015  . Years since quitting: 5.2  Smokeless Tobacco Never Used   BP Readings from Last 3 Encounters:  09/18/19 130/80  06/15/19 122/78  05/23/19 (!) 140/92   Pulse Readings from Last 3 Encounters:  09/18/19 90  06/15/19 98  05/23/19 94   Wt Readings from Last 3 Encounters:  05/14/20 190 lb (86.2 kg)  03/05/20 176 lb 14.4 oz (80.2 kg)  09/18/19 176 lb 9.6 oz (80.1 kg)     Assessment/Interventions: Review of patient past medical history, allergies, medications, health status, including review of consultants reports, laboratory and other test data, was performed as part of comprehensive evaluation and provision of chronic care management services.   SDOH:  (Social Determinants of Health) assessments and interventions performed: Yes   CCM Care Plan  Allergies  Allergen Reactions  . Tramadol Rash and Other (See Comments)    dizzy Other reaction(s): Other (See Comments) dizzy dizzy    Medications Reviewed Today    Reviewed by Augustin Coupe, CMA (Certified Medical Assistant) on 05/14/20 at 1101  Med List Status: <None>  Medication Order Taking? Sig Documenting Provider Last Dose Status Informant  BELBUCA [employer] [id number] Yes  [provider] Taking Active   buprenorphine (BUTRANS) 10 MCG/HR [place] [id number] Yes  [provider] Taking Active   citalopram (CELEXA) 20 MG tablet [id number] Yes TAKE 1 AND 1/2 TABLETS (30MG) EVERY DAY Lillard Anes, MD Taking Active   cyclobenzaprine (FLEXERIL) 10 MG tablet [id number] Yes TAKE 1 TABLET TWICE DAILY  TO THREE TIMES DAILY AS NEEDED FOR MUSCLE SPASM(S) Lillard Anes, MD Taking Active   diclofenac (VOLTAREN) 50 MG EC tablet [id number] Yes Take by mouth. [provider] Taking Active   dicyclomine (BENTYL) 20 MG tablet [id number] Yes Take 1 tablet (20 mg total) by mouth every 6 (six) hours. Lillard Anes, MD Taking Active   lidocaine (LIDODERM) 5 % [id number] Yes  [provider] Taking Active   Multiple Vitamin (MULTIVITAMIN WITH MINERALS) TABS tablet [id number] Yes Take 1 tablet by mouth daily. [provider] Taking Active Self  nortriptyline (PAMELOR) 10 MG capsule [id number] Yes TAKE 1 CAPSULE(10 MG) BY MOUTH AT BEDTIME Pieter Partridge, DO Taking Active   omeprazole (PRILOSEC) 40 MG capsule [id number] Yes TAKE 1 CAPSULE EVERY DAY Lillard Anes, MD Taking Active   QUEtiapine (SEROQUEL) 50 MG tablet [id number] Yes Take 1 tablet (50 mg total) by mouth at bedtime. Lillard Anes, MD Taking Active   simvastatin (ZOCOR) 40 MG tablet [id number] Yes Take 1 tablet (40 mg total) by mouth daily at 6 PM. Lillard Anes, MD Taking Active   tamsulosin [facility]) 0.4 MG CAPS capsule [id number] Yes TAKE 1 CAPSULE  BY MOUTH BEFORE BED. Lillard Anes, MD Taking Active           Patient Active Problem List   Diagnosis Date Noted  . Sinusitis 03/05/2020  . PTSD (post-traumatic stress disorder) 06/15/2019  . OSA (obstructive sleep apnea) 06/15/2019  . Inguinal hernia, right 05/23/2019  . Primary generalized (osteo)arthritis 05/22/2019  . Enlarged prostate with urinary obstruction 05/22/2019  . Left rotator cuff tear 05/09/2019  . Obstructive chronic bronchitis without exacerbation ([place]) 05/08/2019  . Lumbar adjacent segment disease with spondylolisthesis 02/25/2016  . Cervical pseudoarthrosis ([place]) 04/05/2015  . Cervical stenosis of spinal canal 12/15/2013    Immunization History  Administered Date(s)  Administered  . Hepatitis A 10/24/2012  . Hepatitis B 10/24/2012  . PFIZER(Purple Top)SARS-COV-2 Vaccination 06/21/2019, 07/19/2019, 02/21/2020  . Pneumococcal Conjugate-13 04/12/2014  . Pneumococcal Polysaccharide-23 12/22/2017  . Zoster 10/29/2015    Conditions to be addressed/monitored:  GERD, COPD, BPH and PTSD, Anxiety, depression  There are no care plans that you recently modified to display for this patient.    Medication Assistance: {MEDASSISTANCEINFO:25044}  Patient's preferred pharmacy is:  [facility] [place], [place] - [address] AT [address] [place] [address] Phone: [phone] Fax: [phone]  [facility] - [place], [place] [address] Phone: [phone] Fax:  [phone]  Uses pill box? {Yes or If no, why not?:20788} Pt endorses ***% compliance  We discussed: {Pharmacy options:24294} Patient decided to: {[facility] Pharmacy [facility]  Care Plan and Follow Up Patient Decision:  Patient agrees to Care Plan and Follow-up.  Plan: Telephone follow up appointment with care management team member scheduled for:  ***     Current Barriers:  . {pharmacybarriers:24917} . ***  Pharmacist Clinical Goal(s):  Marland Kitchen Over the next 90 days, patient will {PHARMACYGOALCHOICES:24921} through collaboration with PharmD and provider.  . ***  Interventions: . 1:1 collaboration with Lillard Anes, MD regarding development and update of comprehensive plan of care as evidenced by provider attestation and co-signature . Inter-disciplinary care team collaboration (see longitudinal plan of care) . Comprehensive medication review performed; medication list updated in electronic medical record  Hyperlipidemia: (LDL goal < ***) -{CHL Controlled/Uncontrolled:[phone]} -Current treatment: . ***simvastatin 40 mg daily  -Medications previously tried: ***  -Current dietary patterns: *** -Current exercise habits: *** -Educated on {CCM HLD Counseling:25126} -{CCMPHARMDINTERVENTION:25122}    COPD (Goal: control symptoms and prevent exacerbations) -{CHL Controlled/Uncontrolled:607-175-2885} -Current treatment  . *** -Medications previously tried: ***  -Gold Grade: {CHL HP [facility] Pharm COPD Gold EVOJJ:[id number]} -Current COPD Classification:  {CHL HP Upstream Pharm COPD Classification:7474638868} -MMRC/CAT score: *** -Pulmonary function testing: *** -Exacerbations requiring treatment in last 6 months: *** -Patient {Actions; denies-reports:120008} consistent use of maintenance inhaler -Frequency of rescue inhaler use: *** -Counseled on {CCMINHALERCOUNSELING:25121} -{CCMPHARMDINTERVENTION:25122}  Depression/Anxiety (Goal: ***) -{CHL  Controlled/Uncontrolled:[phone]} -Current treatment: . ***citalopram 20 mg 1.5 tablets daily  . Nortriptyline 10 mg daily at bedtime . Seroquesl 50 mg daily at bedtime  -Medications previously tried/failed: ***diazepam -PHQ9: *** -GAD7: *** -Connected with *** for mental health support -Educated on {CCM mental health counseling:25127} -{CCMPHARMDINTERVENTION:25122}  ***Chronic Pain  (Goal: ***) -{CHL Controlled/Uncontrolled:[phone]} -Current treatment  . Belbuca 150 mcg film . Butrans 10 mcg/hr patch  . Flexeril 10 mg tid prn spasms . Voltaren 50 mg EC *** . Lidoderm patch *** -Medications previously tried: *** hydrocodone, meloxicam, oxycodone -{CCMPHARMDINTERVENTION:25122}   *** (Goal: ***) -{CHL Controlled/Uncontrolled:[phone]} -Current treatment  . ***tamsulosin 0.4 mg daily before bed -Medications previously tried: ***  -{CCMPHARMDINTERVENTION:25122}  *** (Goal: ***) -{CHL Controlled/Uncontrolled:[phone]} -Current treatment  . *** -Medications previously tried: ***  -{CCMPHARMDINTERVENTION:25122}  *** (Goal: ***) -{CHL Controlled/Uncontrolled:[phone]} -Current treatment  . Dicyclomine 20 mg every 6 hours . Omeprazole 40 mg daily *** -Medications previously tried: ***  -{CCMPHARMDINTERVENTION:25122}  Health Maintenance -Vaccine gaps: *** -Current therapy:  . ***multivitamin daily  -Educated on {ccm supplement counseling:25128} -{CCM Patient satisfied:25129} -{CCMPHARMDINTERVENTION:25122}   Patient Goals/Self-Care Activities . Over the next *** days, patient will:  - {pharmacypatientgoals:24919}  Follow Up Plan: {CM FOLLOW UP BZJI:96789}

## 2020-05-27 ENCOUNTER — Ambulatory Visit: Payer: Medicare HMO | Admitting: Legal Medicine

## 2020-05-29 ENCOUNTER — Telehealth: Payer: Medicare HMO

## 2020-05-30 ENCOUNTER — Ambulatory Visit: Payer: Medicare HMO | Admitting: Legal Medicine

## 2020-06-07 ENCOUNTER — Ambulatory Visit (INDEPENDENT_AMBULATORY_CARE_PROVIDER_SITE_OTHER): Payer: Medicare HMO | Admitting: Legal Medicine

## 2020-06-07 ENCOUNTER — Other Ambulatory Visit: Payer: Self-pay

## 2020-06-07 ENCOUNTER — Encounter: Payer: Self-pay | Admitting: Legal Medicine

## 2020-06-07 VITALS — BP 104/68 | HR 79 | Temp 97.5°F | Resp 16 | Ht 70.0 in | Wt 180.2 lb

## 2020-06-07 DIAGNOSIS — J449 Chronic obstructive pulmonary disease, unspecified: Secondary | ICD-10-CM

## 2020-06-07 DIAGNOSIS — R1310 Dysphagia, unspecified: Secondary | ICD-10-CM | POA: Diagnosis not present

## 2020-06-07 DIAGNOSIS — R100 Acute abdomen: Secondary | ICD-10-CM

## 2020-06-07 DIAGNOSIS — G894 Chronic pain syndrome: Secondary | ICD-10-CM | POA: Diagnosis not present

## 2020-06-07 DIAGNOSIS — R109 Unspecified abdominal pain: Secondary | ICD-10-CM | POA: Diagnosis not present

## 2020-06-07 DIAGNOSIS — R1084 Generalized abdominal pain: Secondary | ICD-10-CM

## 2020-06-07 MED ORDER — NALOXEGOL OXALATE 25 MG PO TABS: 25.0000 mg | ORAL_TABLET | Freq: Every day | ORAL | 2 refills | Status: DC

## 2020-06-07 NOTE — Progress Notes (Signed)
Subjective:  Patient ID: Clarence Davis, male    DOB: [birth year]  Age: 60 y.o. MRN: [id number]  Chief Complaint  Patient presents with  . Abdominal Pain    Patient describes pains as stabbing pains, very nauseated, and has diarrhea. Started a couple months ago.     HPI: patient has abdominal pain for 2 months.  He is on omeprazole.  He can swallow small amounts and he gets odoynophagia.  He has constipation and some diarrhea. He can eat ok.  He is having regular BM.     Current Outpatient Medications on File Prior to Visit  Medication Sig Dispense Refill  . BELBUCA 150 MCG FILM     . buprenorphine (BUTRANS) 10 MCG/HR PTWK     . citalopram (CELEXA) 20 MG tablet TAKE 1 AND 1/2 TABLETS (30MG ) EVERY DAY 135 tablet 2  . cyclobenzaprine (FLEXERIL) 10 MG tablet TAKE 1 TABLET TWICE DAILY  TO THREE TIMES DAILY AS NEEDED FOR MUSCLE SPASM(S) 60 tablet 3  . diclofenac (VOLTAREN) 50 MG EC tablet Take by mouth.    . dicyclomine (BENTYL) 20 MG tablet Take 1 tablet (20 mg total) by mouth every 6 (six) hours. 90 tablet 3  . lidocaine (LIDODERM) 5 %     . Multiple Vitamin (MULTIVITAMIN WITH MINERALS) TABS tablet Take 1 tablet by mouth daily.    . nortriptyline (PAMELOR) 10 MG capsule TAKE 1 CAPSULE(10 MG) BY MOUTH AT BEDTIME 90 capsule 0  . omeprazole (PRILOSEC) 40 MG capsule TAKE 1 CAPSULE EVERY DAY 90 capsule 2  . QUEtiapine (SEROQUEL) 50 MG tablet Take 1 tablet (50 mg total) by mouth at bedtime. 30 tablet 2  . simvastatin (ZOCOR) 40 MG tablet Take 1 tablet (40 mg total) by mouth daily at 6 PM. 90 tablet 2  . tamsulosin (FLOMAX) 0.4 MG CAPS capsule TAKE 1 CAPSULE  BY MOUTH BEFORE BED. 90 capsule 2   No current facility-administered medications on file prior to visit.   Past Medical History:  Diagnosis Date  . Alcoholic ([place])    quit drinking many yrs ago per pt  . Anxiety   . Arthritis    "hands" (04/05/2015)  . Bilateral carpal tunnel syndrome   . Chronic back pain    "all over"  . Chronic  diarrhea   . Depression    takes Citalopram daily  . Dry skin   . Dysrhythmia    PVC's   . GERD (gastroesophageal reflux disease)    takes Omeprazole daily  . Head injury    early 59's  . Headache    "weekly" (04/05/2015)  . Hepatitis C    "treated w/RX; don't have it anymore" (04/05/2015)   Past Surgical History:  Procedure Laterality Date  . ANTERIOR CERVICAL DECOMP/DISCECTOMY FUSION N/A 12/15/2013   Procedure: CERVICAL FOUR TO FIVE, CERVICAL FIVE TO SIX, CERVICAL SIX TO SEVEN CERVICAL DECOMPRESSION/DISCECTOMY FUSION 3 LEVELS;  Surgeon: Floyce Stakes, MD;  Location: [facility];  Service: Neurosurgery;  Laterality: N/A;  C4-5 C5-6 C6-7 Anterior cervical decompression/diskectomy/fusion  . BACK SURGERY    . CLOSED REDUCTION SHOULDER DISLOCATION Left 1975   patient denies  . ESOPHAGOGASTRODUODENOSCOPY    . HERNIA REPAIR    . INGUINAL HERNIA REPAIR Left X 3   "  . POSTERIOR CERVICAL FUSION/FORAMINOTOMY  04/05/2015  . POSTERIOR CERVICAL FUSION/FORAMINOTOMY N/A 04/05/2015   Procedure: Cervical three-four, Cervical four-five, Cervical five-six, Cervical six-seven Posterior cervical fusion with lateral mass fixation;  Surgeon: Leeroy Cha, MD;  Location:  [facility];  Service: Neurosurgery;  Laterality: N/A;  C3-4 C4-5 C5-6 C6-7 Posterior cervical fusion with lateral mass fixation  . UMBILICAL HERNIA REPAIR    . WISDOM TOOTH EXTRACTION      Family History  Problem Relation Age of Onset  . Throat cancer Father   . Heart disease Sister    Social History   Socioeconomic History  . Marital status: Married    Spouse name: Not on file  . Number of children: 2  . Years of education: Not on file  . Highest education level: 12th grade  Occupational History  . Occupation: disabled  Tobacco Use  . Smoking status: Current Some Day Smoker    Packs/day: 0.15    Years: 28.00    Pack years: 4.20    Types: E-cigarettes, Cigarettes    Last attempt to quit: 02/25/2015    Years  since quitting: 5.2  . Smokeless tobacco: Never Used  Substance and Sexual Activity  . Alcohol use: Never    Comment: 04/05/2015 "recovering alcoholic; last drink 54/09/2701"  . Drug use: Yes    Types: Marijuana  . Sexual activity: Not Currently  Other Topics Concern  . Not on file  Social History Narrative   Patient is left-handed. He lives with his long time girlfriend [name] in a one level home home. He drinks 3 cups of coffee and 4 Coca-Colas a day. He tries to walk when able.   Social Determinants of Health   Financial Resource Strain: Not on file  Food Insecurity: Not on file  Transportation Needs: Not on file  Physical Activity: Not on file  Stress: Not on file  Social Connections: Not on file    Review of Systems  Constitutional: Negative for activity change and unexpected weight change.  HENT: Negative.   Eyes: Negative for visual disturbance.  Respiratory: Negative for chest tightness and shortness of breath.   Cardiovascular: Negative for chest pain, palpitations and leg swelling.  Gastrointestinal: Positive for abdominal pain, constipation and diarrhea.  Genitourinary: Negative.   Musculoskeletal: Negative for arthralgias and back pain.  Neurological: Negative.   Psychiatric/Behavioral: Negative.      Objective:  BP 104/68 (BP Location: Left Arm, Patient Position: Sitting, Cuff Size: Normal) Comment (BP Location): l  Pulse 79   Temp (!) 97.5 F (36.4 C) (Temporal)   Resp 16   Ht 5\' 10"  (1.778 m)   Wt 180 lb 3.2 oz (81.7 kg)   SpO2 98%   BMI 25.86 kg/m   BP/Weight 06/07/2020 05/14/2020 50/12/3816  Systolic BP 299 - -  Diastolic BP 68 - -  Wt. (Lbs) 180.2 190 176.9  BMI 25.86 27.26 25.38    Physical Exam Vitals reviewed.  Constitutional:      Appearance: He is well-developed.  HENT:     Mouth/Throat:     Mouth: Mucous membranes are moist.  Eyes:     Extraocular Movements: Extraocular movements intact.  Cardiovascular:     Rate and Rhythm:  Normal rate and regular rhythm.     Heart sounds: Murmur heard.  No gallop.   Pulmonary:     Effort: Pulmonary effort is normal. No respiratory distress.  Abdominal:     General: Abdomen is flat. Bowel sounds are normal.     Tenderness: There is generalized abdominal tenderness.  Neurological:     General: No focal deficit present.     Mental Status: He is alert.       Lab Results  Component Value  Date   WBC 4.7 02/19/2016   HGB 15.6 02/19/2016   HCT 45.0 02/19/2016   PLT 148 (L) 02/19/2016   GLUCOSE 97 02/19/2016   ALT 16 (L) 02/19/2016   AST 19 02/19/2016   NA 137 02/19/2016   K 4.0 02/19/2016   CL 100 (L) 02/19/2016   CREATININE 1.07 02/19/2016   BUN 9 02/19/2016   CO2 29 02/19/2016      Assessment & Plan:  Diagnoses and all orders for this visit: Odynophagia -     Ambulatory referral to Gastroenterology Patient is having trouble swallowing needs to see GI  Obstructive chronic bronchitis without exacerbation ([place]) -     CT ABDOMEN PELVIS W WO CONTRAST An individualize plan was formulated for care of COPD.  Treatment is evidence based.  She will continue on inhalers, avoid smoking and smoke.  Regular exercise with help with dyspnea. Routine follow ups and medication compliance is needed.  Chronic pain syndrome -     naloxegol oxalate (MOVANTIK) 25 MG TABS tablet; Take 1 tablet (25 mg total) by mouth daily. Patient is going to pain clinic  Generalized abdominal pain Get CT abdomen- negative           I spent 25 minutes dedicated to the care of this patient on the date of this encounter to include face-to-face time with the patient, as well as:   Follow-up: Return if symptoms worsen or fail to improve.  An After Visit Summary was printed and given to the patient.  Reinaldo Meeker, MD [facility] [phone]

## 2020-06-09 DIAGNOSIS — R109 Unspecified abdominal pain: Secondary | ICD-10-CM | POA: Insufficient documentation

## 2020-06-09 HISTORY — DX: Unspecified abdominal pain: R10.9

## 2020-06-16 ENCOUNTER — Other Ambulatory Visit: Payer: Self-pay | Admitting: Legal Medicine

## 2020-06-20 DIAGNOSIS — R0602 Shortness of breath: Secondary | ICD-10-CM | POA: Diagnosis not present

## 2020-06-20 DIAGNOSIS — Z122 Encounter for screening for malignant neoplasm of respiratory organs: Secondary | ICD-10-CM | POA: Diagnosis not present

## 2020-06-20 DIAGNOSIS — Z131 Encounter for screening for diabetes mellitus: Secondary | ICD-10-CM | POA: Diagnosis not present

## 2020-06-20 DIAGNOSIS — E7800 Pure hypercholesterolemia, unspecified: Secondary | ICD-10-CM | POA: Diagnosis not present

## 2020-06-20 DIAGNOSIS — Z125 Encounter for screening for malignant neoplasm of prostate: Secondary | ICD-10-CM | POA: Diagnosis not present

## 2020-06-20 DIAGNOSIS — D539 Nutritional anemia, unspecified: Secondary | ICD-10-CM | POA: Diagnosis not present

## 2020-06-20 DIAGNOSIS — Z0000 Encounter for general adult medical examination without abnormal findings: Secondary | ICD-10-CM | POA: Diagnosis not present

## 2020-06-20 DIAGNOSIS — R5383 Other fatigue: Secondary | ICD-10-CM | POA: Diagnosis not present

## 2020-06-20 DIAGNOSIS — Z79899 Other long term (current) drug therapy: Secondary | ICD-10-CM | POA: Diagnosis not present

## 2020-07-02 DIAGNOSIS — F17210 Nicotine dependence, cigarettes, uncomplicated: Secondary | ICD-10-CM | POA: Diagnosis not present

## 2020-07-02 DIAGNOSIS — M542 Cervicalgia: Secondary | ICD-10-CM | POA: Diagnosis not present

## 2020-07-02 DIAGNOSIS — G8929 Other chronic pain: Secondary | ICD-10-CM | POA: Diagnosis not present

## 2020-07-02 DIAGNOSIS — M545 Low back pain, unspecified: Secondary | ICD-10-CM | POA: Diagnosis not present

## 2020-07-02 DIAGNOSIS — M546 Pain in thoracic spine: Secondary | ICD-10-CM | POA: Diagnosis not present

## 2020-07-02 DIAGNOSIS — Z6824 Body mass index (BMI) 24.0-24.9, adult: Secondary | ICD-10-CM | POA: Diagnosis not present

## 2020-07-02 DIAGNOSIS — Z79899 Other long term (current) drug therapy: Secondary | ICD-10-CM | POA: Diagnosis not present

## 2020-07-02 DIAGNOSIS — M25512 Pain in left shoulder: Secondary | ICD-10-CM | POA: Diagnosis not present

## 2020-07-08 DIAGNOSIS — M546 Pain in thoracic spine: Secondary | ICD-10-CM | POA: Diagnosis not present

## 2020-07-08 DIAGNOSIS — M542 Cervicalgia: Secondary | ICD-10-CM | POA: Diagnosis not present

## 2020-07-15 DIAGNOSIS — J342 Deviated nasal septum: Secondary | ICD-10-CM | POA: Diagnosis not present

## 2020-07-15 DIAGNOSIS — J3089 Other allergic rhinitis: Secondary | ICD-10-CM

## 2020-07-15 DIAGNOSIS — F17210 Nicotine dependence, cigarettes, uncomplicated: Secondary | ICD-10-CM | POA: Diagnosis not present

## 2020-07-15 DIAGNOSIS — R0683 Snoring: Secondary | ICD-10-CM | POA: Insufficient documentation

## 2020-07-15 HISTORY — DX: Snoring: R06.83

## 2020-07-15 HISTORY — DX: Other allergic rhinitis: J30.89

## 2020-07-18 ENCOUNTER — Other Ambulatory Visit: Payer: Self-pay | Admitting: Orthopaedic Surgery

## 2020-07-18 DIAGNOSIS — M5414 Radiculopathy, thoracic region: Secondary | ICD-10-CM

## 2020-07-21 ENCOUNTER — Other Ambulatory Visit: Payer: Self-pay

## 2020-07-21 ENCOUNTER — Ambulatory Visit
Admission: RE | Admit: 2020-07-21 | Discharge: 2020-07-21 | Disposition: A | Discharge status: Home / Self Care | Payer: Medicare HMO | Source: Ambulatory Visit | Attending: Orthopaedic Surgery | Admitting: Orthopaedic Surgery

## 2020-07-21 DIAGNOSIS — D1809 Hemangioma of other sites: Secondary | ICD-10-CM | POA: Diagnosis not present

## 2020-07-21 DIAGNOSIS — R600 Localized edema: Secondary | ICD-10-CM | POA: Diagnosis not present

## 2020-07-21 DIAGNOSIS — M5124 Other intervertebral disc displacement, thoracic region: Secondary | ICD-10-CM | POA: Diagnosis not present

## 2020-07-21 DIAGNOSIS — M5414 Radiculopathy, thoracic region: Secondary | ICD-10-CM

## 2020-07-21 DIAGNOSIS — M47814 Spondylosis without myelopathy or radiculopathy, thoracic region: Secondary | ICD-10-CM | POA: Diagnosis not present

## 2020-07-26 DIAGNOSIS — M5412 Radiculopathy, cervical region: Secondary | ICD-10-CM | POA: Diagnosis not present

## 2020-07-26 DIAGNOSIS — M546 Pain in thoracic spine: Secondary | ICD-10-CM | POA: Diagnosis not present

## 2020-07-26 DIAGNOSIS — K219 Gastro-esophageal reflux disease without esophagitis: Secondary | ICD-10-CM | POA: Diagnosis not present

## 2020-07-26 DIAGNOSIS — M5414 Radiculopathy, thoracic region: Secondary | ICD-10-CM | POA: Diagnosis not present

## 2020-07-30 DIAGNOSIS — J342 Deviated nasal septum: Secondary | ICD-10-CM | POA: Diagnosis not present

## 2020-07-30 DIAGNOSIS — J343 Hypertrophy of nasal turbinates: Secondary | ICD-10-CM | POA: Diagnosis not present

## 2020-07-30 DIAGNOSIS — F17210 Nicotine dependence, cigarettes, uncomplicated: Secondary | ICD-10-CM | POA: Diagnosis not present

## 2020-08-02 DIAGNOSIS — M25512 Pain in left shoulder: Secondary | ICD-10-CM | POA: Diagnosis not present

## 2020-08-02 DIAGNOSIS — Z79899 Other long term (current) drug therapy: Secondary | ICD-10-CM | POA: Diagnosis not present

## 2020-08-02 DIAGNOSIS — M542 Cervicalgia: Secondary | ICD-10-CM | POA: Diagnosis not present

## 2020-08-02 DIAGNOSIS — F17210 Nicotine dependence, cigarettes, uncomplicated: Secondary | ICD-10-CM | POA: Diagnosis not present

## 2020-08-02 DIAGNOSIS — Z981 Arthrodesis status: Secondary | ICD-10-CM | POA: Diagnosis not present

## 2020-08-02 DIAGNOSIS — F1120 Opioid dependence, uncomplicated: Secondary | ICD-10-CM | POA: Diagnosis not present

## 2020-08-02 DIAGNOSIS — G8929 Other chronic pain: Secondary | ICD-10-CM | POA: Diagnosis not present

## 2020-08-13 DIAGNOSIS — M5412 Radiculopathy, cervical region: Secondary | ICD-10-CM | POA: Diagnosis not present

## 2020-08-16 ENCOUNTER — Other Ambulatory Visit: Payer: Self-pay | Admitting: Legal Medicine

## 2020-08-16 DIAGNOSIS — K2100 Gastro-esophageal reflux disease with esophagitis, without bleeding: Secondary | ICD-10-CM

## 2020-08-21 ENCOUNTER — Other Ambulatory Visit: Payer: Self-pay | Admitting: Legal Medicine

## 2020-09-02 DIAGNOSIS — Z6824 Body mass index (BMI) 24.0-24.9, adult: Secondary | ICD-10-CM | POA: Diagnosis not present

## 2020-09-02 DIAGNOSIS — Z122 Encounter for screening for malignant neoplasm of respiratory organs: Secondary | ICD-10-CM | POA: Diagnosis not present

## 2020-09-02 DIAGNOSIS — M25512 Pain in left shoulder: Secondary | ICD-10-CM | POA: Diagnosis not present

## 2020-09-02 DIAGNOSIS — G8929 Other chronic pain: Secondary | ICD-10-CM | POA: Diagnosis not present

## 2020-09-02 DIAGNOSIS — Z79899 Other long term (current) drug therapy: Secondary | ICD-10-CM | POA: Diagnosis not present

## 2020-09-02 DIAGNOSIS — M542 Cervicalgia: Secondary | ICD-10-CM | POA: Diagnosis not present

## 2020-09-02 DIAGNOSIS — M5136 Other intervertebral disc degeneration, lumbar region: Secondary | ICD-10-CM | POA: Diagnosis not present

## 2020-09-26 ENCOUNTER — Other Ambulatory Visit: Payer: Self-pay | Admitting: Legal Medicine

## 2020-10-03 DIAGNOSIS — Z6823 Body mass index (BMI) 23.0-23.9, adult: Secondary | ICD-10-CM | POA: Diagnosis not present

## 2020-10-03 DIAGNOSIS — M542 Cervicalgia: Secondary | ICD-10-CM | POA: Diagnosis not present

## 2020-10-03 DIAGNOSIS — Z87891 Personal history of nicotine dependence: Secondary | ICD-10-CM | POA: Diagnosis not present

## 2020-10-03 DIAGNOSIS — G8929 Other chronic pain: Secondary | ICD-10-CM | POA: Diagnosis not present

## 2020-10-03 DIAGNOSIS — M25512 Pain in left shoulder: Secondary | ICD-10-CM | POA: Diagnosis not present

## 2020-10-03 DIAGNOSIS — Z79899 Other long term (current) drug therapy: Secondary | ICD-10-CM | POA: Diagnosis not present

## 2020-10-03 DIAGNOSIS — M5136 Other intervertebral disc degeneration, lumbar region: Secondary | ICD-10-CM | POA: Diagnosis not present

## 2020-10-29 DIAGNOSIS — M542 Cervicalgia: Secondary | ICD-10-CM | POA: Diagnosis not present

## 2020-10-29 DIAGNOSIS — M546 Pain in thoracic spine: Secondary | ICD-10-CM | POA: Diagnosis not present

## 2020-10-29 DIAGNOSIS — M25512 Pain in left shoulder: Secondary | ICD-10-CM | POA: Diagnosis not present

## 2020-10-29 DIAGNOSIS — Z79899 Other long term (current) drug therapy: Secondary | ICD-10-CM | POA: Diagnosis not present

## 2020-10-29 DIAGNOSIS — Z6823 Body mass index (BMI) 23.0-23.9, adult: Secondary | ICD-10-CM | POA: Diagnosis not present

## 2020-10-29 DIAGNOSIS — G8929 Other chronic pain: Secondary | ICD-10-CM | POA: Diagnosis not present

## 2020-11-29 DIAGNOSIS — Z6823 Body mass index (BMI) 23.0-23.9, adult: Secondary | ICD-10-CM | POA: Diagnosis not present

## 2020-11-29 DIAGNOSIS — M542 Cervicalgia: Secondary | ICD-10-CM | POA: Diagnosis not present

## 2020-11-29 DIAGNOSIS — F17210 Nicotine dependence, cigarettes, uncomplicated: Secondary | ICD-10-CM | POA: Diagnosis not present

## 2020-11-29 DIAGNOSIS — M545 Low back pain, unspecified: Secondary | ICD-10-CM | POA: Diagnosis not present

## 2020-11-29 DIAGNOSIS — Z79899 Other long term (current) drug therapy: Secondary | ICD-10-CM | POA: Diagnosis not present

## 2020-11-29 DIAGNOSIS — Z981 Arthrodesis status: Secondary | ICD-10-CM | POA: Diagnosis not present

## 2020-11-29 DIAGNOSIS — G8929 Other chronic pain: Secondary | ICD-10-CM | POA: Diagnosis not present

## 2020-12-02 DIAGNOSIS — Z79899 Other long term (current) drug therapy: Secondary | ICD-10-CM | POA: Diagnosis not present

## 2020-12-13 DIAGNOSIS — Z87891 Personal history of nicotine dependence: Secondary | ICD-10-CM | POA: Diagnosis not present

## 2020-12-30 DIAGNOSIS — E7800 Pure hypercholesterolemia, unspecified: Secondary | ICD-10-CM | POA: Diagnosis not present

## 2020-12-30 DIAGNOSIS — G8929 Other chronic pain: Secondary | ICD-10-CM | POA: Diagnosis not present

## 2020-12-30 DIAGNOSIS — E559 Vitamin D deficiency, unspecified: Secondary | ICD-10-CM | POA: Diagnosis not present

## 2020-12-30 DIAGNOSIS — Z6823 Body mass index (BMI) 23.0-23.9, adult: Secondary | ICD-10-CM | POA: Diagnosis not present

## 2020-12-30 DIAGNOSIS — M545 Low back pain, unspecified: Secondary | ICD-10-CM | POA: Diagnosis not present

## 2020-12-30 DIAGNOSIS — Z79899 Other long term (current) drug therapy: Secondary | ICD-10-CM | POA: Diagnosis not present

## 2020-12-30 DIAGNOSIS — Z125 Encounter for screening for malignant neoplasm of prostate: Secondary | ICD-10-CM | POA: Diagnosis not present

## 2021-01-02 DIAGNOSIS — Z79899 Other long term (current) drug therapy: Secondary | ICD-10-CM | POA: Diagnosis not present

## 2021-01-08 DIAGNOSIS — R0602 Shortness of breath: Secondary | ICD-10-CM | POA: Diagnosis not present

## 2021-01-08 DIAGNOSIS — J410 Simple chronic bronchitis: Secondary | ICD-10-CM | POA: Diagnosis not present

## 2021-01-08 DIAGNOSIS — R053 Chronic cough: Secondary | ICD-10-CM | POA: Diagnosis not present

## 2021-01-08 DIAGNOSIS — F17210 Nicotine dependence, cigarettes, uncomplicated: Secondary | ICD-10-CM | POA: Diagnosis not present

## 2021-01-08 DIAGNOSIS — T7840XA Allergy, unspecified, initial encounter: Secondary | ICD-10-CM | POA: Diagnosis not present

## 2021-01-29 DIAGNOSIS — M542 Cervicalgia: Secondary | ICD-10-CM | POA: Diagnosis not present

## 2021-01-29 DIAGNOSIS — Z79899 Other long term (current) drug therapy: Secondary | ICD-10-CM | POA: Diagnosis not present

## 2021-01-29 DIAGNOSIS — M5136 Other intervertebral disc degeneration, lumbar region: Secondary | ICD-10-CM | POA: Diagnosis not present

## 2021-01-29 DIAGNOSIS — M129 Arthropathy, unspecified: Secondary | ICD-10-CM | POA: Diagnosis not present

## 2021-01-29 DIAGNOSIS — Z23 Encounter for immunization: Secondary | ICD-10-CM | POA: Diagnosis not present

## 2021-01-29 DIAGNOSIS — M545 Low back pain, unspecified: Secondary | ICD-10-CM | POA: Diagnosis not present

## 2021-01-29 DIAGNOSIS — G8929 Other chronic pain: Secondary | ICD-10-CM | POA: Diagnosis not present

## 2021-03-05 ENCOUNTER — Emergency Department (HOSPITAL_COMMUNITY)
Admission: EM | Admit: 2021-03-05 | Discharge: 2021-03-05 | Disposition: A | Discharge status: Home / Self Care | Payer: Medicare HMO | Attending: Emergency Medicine | Admitting: Emergency Medicine

## 2021-03-05 ENCOUNTER — Encounter (HOSPITAL_COMMUNITY): Payer: Self-pay | Admitting: Emergency Medicine

## 2021-03-05 ENCOUNTER — Emergency Department (HOSPITAL_COMMUNITY): Payer: Medicare HMO

## 2021-03-05 DIAGNOSIS — F17210 Nicotine dependence, cigarettes, uncomplicated: Secondary | ICD-10-CM | POA: Insufficient documentation

## 2021-03-05 DIAGNOSIS — R1084 Generalized abdominal pain: Secondary | ICD-10-CM | POA: Insufficient documentation

## 2021-03-05 LAB — LIPASE, BLOOD: Lipase: 38 U/L (ref 11–51)

## 2021-03-05 LAB — URINALYSIS, ROUTINE W REFLEX MICROSCOPIC
Bilirubin Urine: NEGATIVE
Glucose, UA: NEGATIVE mg/dL
Hgb urine dipstick: NEGATIVE
Ketones, ur: NEGATIVE mg/dL
Leukocytes,Ua: NEGATIVE
Nitrite: NEGATIVE
Protein, ur: NEGATIVE mg/dL
Specific Gravity, Urine: 1.01 (ref 1.005–1.030)
pH: 7.5 (ref 5.0–8.0)

## 2021-03-05 LAB — COMPREHENSIVE METABOLIC PANEL
ALT: 24 U/L (ref 0–44)
AST: 25 U/L (ref 15–41)
Albumin: 4.2 g/dL (ref 3.5–5.0)
Alkaline Phosphatase: 67 U/L (ref 38–126)
Anion gap: 10 (ref 5–15)
BUN: 6 mg/dL (ref 6–20)
CO2: 24 mmol/L (ref 22–32)
Calcium: 9.6 mg/dL (ref 8.9–10.3)
Chloride: 104 mmol/L (ref 98–111)
Creatinine, Ser: 0.98 mg/dL (ref 0.61–1.24)
GFR, Estimated: >60 mL/min (ref >60)
Glucose, Bld: 103 mg/dL — ABNORMAL HIGH (ref 70–99)
Potassium: 3.4 mmol/L — ABNORMAL LOW (ref 3.5–5.1)
Sodium: 138 mmol/L (ref 135–145)
Total Bilirubin: 0.5 mg/dL (ref 0.3–1.2)
Total Protein: 6.8 g/dL (ref 6.5–8.1)

## 2021-03-05 LAB — CBC WITH DIFFERENTIAL/PLATELET
Abs Immature Granulocytes: 0.01 10*3/uL (ref 0.00–0.07)
Basophils Absolute: 0 10*3/uL (ref 0.0–0.1)
Basophils Relative: 1 %
Eosinophils Absolute: 0.1 10*3/uL (ref 0.0–0.5)
Eosinophils Relative: 1 %
HCT: 44.4 % (ref 39.0–52.0)
Hemoglobin: 15.1 g/dL (ref 13.0–17.0)
Immature Granulocytes: 0 %
Lymphocytes Relative: 30 %
Lymphs Abs: 1.8 10*3/uL (ref 0.7–4.0)
MCH: 32.7 pg (ref 26.0–34.0)
MCHC: 34 g/dL (ref 30.0–36.0)
MCV: 96.1 fL (ref 80.0–100.0)
Monocytes Absolute: 0.5 10*3/uL (ref 0.1–1.0)
Monocytes Relative: 7 %
Neutro Abs: 3.8 10*3/uL (ref 1.7–7.7)
Neutrophils Relative %: 61 %
Platelets: 162 10*3/uL (ref 150–400)
RBC: 4.62 MIL/uL (ref 4.22–5.81)
RDW: 13.5 % (ref 11.5–15.5)
WBC: 6.2 10*3/uL (ref 4.0–10.5)
nRBC: 0 % (ref 0.0–0.2)

## 2021-03-05 MED ORDER — DICYCLOMINE HCL 20 MG PO TABS: 20.0000 mg | ORAL_TABLET | Freq: Two times a day (BID) | ORAL | 0 refills | Status: DC

## 2021-03-05 MED ORDER — IOHEXOL 350 MG/ML SOLN
100.0000 mL | Freq: Once | INTRAVENOUS | Status: AC | PRN
  Administered 2021-03-05: 100 mL via INTRAVENOUS

## 2021-03-05 NOTE — ED Provider Notes (Signed)
Emergency Medicine Provider Triage Evaluation Note  Clarence Davis , a 60 y.o. male  was evaluated in triage.  Pt complains of  Abdominal pain generalized for 2 months. Seen by PCP awaiting endoscopy. Seen at [facility] on Monday. Worsening sing he has been out of his Bentyl. Constant sharp radiating to his back. No changes in stool. Feels nauseous. No vomiting. Denies any fevers, chest pain, SOB. Denies any dark or tarry stools.   Review of Systems  Positive: Abdominal pain, nausea Negative: Vomiting, chest pain, SOB  Physical Exam  There were no vitals taken for this visit. Gen:   Awake, no distress   Resp:  Normal effort  MSK:   Moves extremities without difficulty  Other:  Abdomen soft. Generalized tenderness. Non-distended.  Medical Decision Making  Medically screening exam initiated at 11:25 AM.  Appropriate orders placed.  Clarence Davis was informed that the remainder of the evaluation will be completed by another provider, this initial triage assessment does not replace that evaluation, and the importance of remaining in the ED until their evaluation is complete.  Labs and imaging ordered   Clarence Davis, Clarence Davis 03/05/21 1130    Elnora Morrison, MD 03/06/21 [phone]

## 2021-03-05 NOTE — Discharge Instructions (Addendum)
Return for any problem.  ?

## 2021-03-05 NOTE — ED Provider Notes (Signed)
[facility] EMERGENCY DEPARTMENT Provider Note   CSN: [id number] Arrival date & time: 03/05/21  1125     History Chief Complaint  Patient presents with   Abdominal Pain    Clarence Davis is a 60 y.o. male.  60 year old male with prior medical history as detailed below presents for evaluation.  Patient relates a history of longstanding abdominal pain and discomfort.  Patient reports that this has been evaluated by his PCP.  Patient reports he was seen in [facility] ED within the last week for same complaint.  He reports that his prior work-ups and evaluation have been without clear diagnosis.  He has been referred to GI.  He reports that he has not yet established care with local GI.  Patient reports that he is using pencil and antiacid medications.  This has helped his symptoms somewhat.  He denies fever.  He denies bloody emesis.  He reports seeing intermittent bright red blood of small quantities in the toilet after BM and with wiping.  This has been an ongoing issue for the last several weeks as well.  The history is provided by the patient.  Abdominal Pain Pain location:  Generalized Pain quality: aching and cramping   Pain radiates to:  Does not radiate Pain severity:  Moderate Onset quality:  Gradual Duration:  8 weeks Timing:  Intermittent Progression:  Waxing and waning Chronicity:  Chronic     Past Medical History:  Diagnosis Date   Alcoholic ([place])    quit drinking many yrs ago per pt   Anxiety    Arthritis    "hands" (04/05/2015)   Bilateral carpal tunnel syndrome    Chronic back pain    "all over"   Chronic diarrhea    Depression    takes Citalopram daily   Dry skin    Dysrhythmia    PVC's    GERD (gastroesophageal reflux disease)    takes Omeprazole daily   Head injury    early 90's   Headache    "weekly" (04/05/2015)   Hepatitis C    "treated w/RX; don't have it anymore" (04/05/2015)    Patient Active Problem  List   Diagnosis Date Noted   Abdominal pain 06/09/2020   Sinusitis 03/05/2020   Cervical post-laminectomy syndrome 09/28/2019   History of lumbar fusion 09/28/2019   Pain medication agreement 09/28/2019   Thoracic back pain 09/28/2019   PTSD (post-traumatic stress disorder) 06/15/2019   OSA (obstructive sleep apnea) 06/15/2019   Inguinal hernia, right 05/23/2019   Primary generalized (osteo)arthritis 05/22/2019   Enlarged prostate with urinary obstruction 05/22/2019   Left rotator cuff tear 05/09/2019   Obstructive chronic bronchitis without exacerbation ([place]) 05/08/2019   Lumbar adjacent segment disease with spondylolisthesis 02/25/2016   Cervical pseudoarthrosis ([place]) 04/05/2015   Cervical stenosis of spinal canal 12/15/2013    Past Surgical History:  Procedure Laterality Date   ANTERIOR CERVICAL DECOMP/DISCECTOMY FUSION N/A 12/15/2013   Procedure: CERVICAL FOUR TO FIVE, CERVICAL FIVE TO SIX, CERVICAL SIX TO SEVEN CERVICAL DECOMPRESSION/DISCECTOMY FUSION 3 LEVELS;  Surgeon: Floyce Stakes, MD;  Location: [facility];  Service: Neurosurgery;  Laterality: N/A;  C4-5 C5-6 C6-7 Anterior cervical decompression/diskectomy/fusion   BACK SURGERY     CLOSED REDUCTION SHOULDER DISLOCATION Left 1975   patient denies   ESOPHAGOGASTRODUODENOSCOPY     HERNIA REPAIR     INGUINAL HERNIA REPAIR Left X 3   "   POSTERIOR CERVICAL FUSION/FORAMINOTOMY  04/05/2015   POSTERIOR CERVICAL FUSION/FORAMINOTOMY N/A  04/05/2015   Procedure: Cervical three-four, Cervical four-five, Cervical five-six, Cervical six-seven Posterior cervical fusion with lateral mass fixation;  Surgeon: Leeroy Cha, MD;  Location: [facility];  Service: Neurosurgery;  Laterality: N/A;  C3-4 C4-5 C5-6 C6-7 Posterior cervical fusion with lateral mass fixation   UMBILICAL HERNIA REPAIR     WISDOM TOOTH EXTRACTION         Family History  Problem Relation Age of Onset   Throat cancer Father    Heart disease Sister      Social History   Tobacco Use   Smoking status: Some Days    Packs/day: 0.15    Years: 28.00    Pack years: 4.20    Types: E-cigarettes, Cigarettes    Last attempt to quit: 02/25/2015    Years since quitting: 6.0   Smokeless tobacco: Never  Substance Use Topics   Alcohol use: Never    Comment: 04/05/2015 "recovering alcoholic; last drink 38/07/5362"   Drug use: Yes    Types: Marijuana    Home Medications Prior to Admission medications   Medication Sig Start Date End Date Taking? Authorizing Provider  dicyclomine (BENTYL) 20 MG tablet Take 1 tablet (20 mg total) by mouth 2 (two) times daily. 03/05/21  Yes Valarie Merino, MD  BELBUCA 150 MCG FILM  09/29/19   [provider]  buprenorphine [facility]) 10 MCG/HR [place]  11/01/19   [provider]  citalopram (CELEXA) 20 MG tablet TAKE 1 AND 1/2 TABLETS (30MG ) EVERY DAY 09/27/20   [place] Anes, MD  cyclobenzaprine (FLEXERIL) 10 MG tablet TAKE 1 TABLET TWICE DAILY  TO THREE TIMES DAILY AS NEEDED FOR MUSCLE SPASM(S) 08/22/20   [place] Anes, MD  diclofenac (VOLTAREN) 50 MG EC tablet Take by mouth. 10/27/19   [provider]  dicyclomine (BENTYL) 20 MG tablet Take 1 tablet (20 mg total) by mouth every 6 (six) hours. 09/18/19   [place] Anes, MD  lidocaine (LIDODERM) 5 %  07/12/18   [provider]  Multiple Vitamin (MULTIVITAMIN WITH MINERALS) TABS tablet Take 1 tablet by mouth daily.    [provider]  naloxegol oxalate (MOVANTIK) 25 MG TABS tablet Take 1 tablet (25 mg total) by mouth daily. 06/07/20   [place] Anes, MD  nortriptyline (PAMELOR) 10 MG capsule TAKE 1 CAPSULE(10 MG) BY MOUTH AT BEDTIME 07/11/19   Pieter Partridge, DO  omeprazole (PRILOSEC) 40 MG capsule TAKE 1 CAPSULE EVERY DAY 08/18/20   [place] Anes, MD  QUEtiapine (SEROQUEL) 50 MG tablet Take 1 tablet (50 mg total) by mouth at bedtime. 06/15/19   [place] Anes, MD  simvastatin (ZOCOR) 40  MG tablet TAKE 1 TABLET (40 MG TOTAL) BY MOUTH DAILY AT 6 PM. 08/18/20   [place] Anes, MD  tamsulosin [facility]) 0.4 MG CAPS capsule TAKE 1 CAPSULE  BY MOUTH BEFORE BED. 06/16/20   [place] Anes, MD    Allergies    Tramadol  Review of Systems   Review of Systems  Gastrointestinal:  Positive for abdominal pain.  All other systems reviewed and are negative.  Physical Exam Updated Vital Signs BP 103/89   Pulse 73   Temp 98.7 F (37.1 C)   Resp (!) 24   SpO2 100%   Physical Exam Vitals and nursing note reviewed.  Constitutional:      General: He is not in acute distress.    Appearance: Normal appearance. He is well-developed.  HENT:     Head: Normocephalic  and atraumatic.  Eyes:     Conjunctiva/sclera: Conjunctivae normal.     Pupils: Pupils are equal, round, and reactive to light.  Cardiovascular:     Rate and Rhythm: Normal rate and regular rhythm.     Heart sounds: Normal heart sounds.  Pulmonary:     Effort: Pulmonary effort is normal. No respiratory distress.     Breath sounds: Normal breath sounds.  Abdominal:     General: There is no distension.     Palpations: Abdomen is soft.     Tenderness: There is generalized abdominal tenderness.  Genitourinary:    Comments: Rectal exam deferred by patient.  Patient placed in hallway bed.  ED room was unavailable.  Patient declines exam behind privacy curtains in the hallway. Musculoskeletal:        General: No deformity. Normal range of motion.     Cervical back: Normal range of motion and neck supple.  Skin:    General: Skin is warm and dry.  Neurological:     General: No focal deficit present.     Mental Status: He is alert and oriented to person, place, and time.    ED Results / Procedures / Treatments   Labs (all labs ordered are listed, but only abnormal results are displayed) Labs Reviewed  COMPREHENSIVE METABOLIC PANEL - Abnormal; Notable for the following components:      Result Value    Potassium 3.4 (*)    Glucose, Bld 103 (*)    All other components within normal limits  LIPASE, BLOOD  URINALYSIS, ROUTINE W REFLEX MICROSCOPIC  CBC WITH DIFFERENTIAL/PLATELET    EKG None  Radiology CT Abdomen Pelvis W Contrast  Result Date: 03/05/2021 CLINICAL DATA:  60 year old male with history of acute onset of nonlocalized abdominal pain. EXAM: CT ABDOMEN AND PELVIS WITH CONTRAST TECHNIQUE: Multidetector CT imaging of the abdomen and pelvis was performed using the standard protocol following bolus administration of intravenous contrast. CONTRAST:  154mL OMNIPAQUE IOHEXOL 350 MG/ML SOLN COMPARISON:  No priors. FINDINGS: Lower chest: Unremarkable. Hepatobiliary: Atrophy of portions of the hepatic parenchyma adjacent to the falciform ligament. No suspicious cystic or solid hepatic lesions. No intra or extrahepatic biliary ductal dilatation. Gallbladder is normal in appearance. Pancreas: No pancreatic mass. No pancreatic ductal dilatation. No pancreatic or peripancreatic fluid collections or inflammatory changes. Spleen: Unremarkable. Adrenals/Urinary Tract: Bilateral kidneys and bilateral adrenal glands are normal in appearance. No hydroureteronephrosis. Urinary bladder is normal in appearance. Stomach/Bowel: The appearance of the stomach is normal. There is no pathologic dilatation of small bowel or colon. Numerous colonic diverticulae are noted, particularly in the sigmoid colon, without surrounding inflammatory changes to suggest an acute diverticulitis at this time. Vascular/Lymphatic: Aortic atherosclerosis, without evidence of aneurysm or dissection in the abdominal or pelvic vasculature. No lymphadenopathy noted in the abdomen or pelvis. Reproductive: Prostate gland and seminal vesicles are unremarkable in appearance. Other: No significant volume of ascites.  No pneumoperitoneum. Musculoskeletal: Status post mesh repair for ventral hernia. Status post PLIF at L3-L5 with interbody cages at  L3-L4 and interbody grafts at L4-L5. There are no aggressive appearing lytic or blastic lesions noted in the visualized portions of the skeleton. IMPRESSION: 1. No acute findings are noted in the abdomen or pelvis to account for the patient's symptoms. 2. Colonic diverticulosis without evidence of acute diverticulitis at this time. 3. Aortic atherosclerosis. 4. Additional postoperative changes and incidental findings, as above. Electronically Signed   By: Vinnie Langton M.D.   On: 03/05/2021 18:46  Procedures Procedures   Medications Ordered in ED Medications  iohexol (OMNIPAQUE) 350 MG/ML injection 100 mL (100 mLs Intravenous Contrast Given 03/05/21 1804)    ED Course  I have reviewed the triage vital signs and the nursing notes.  Pertinent labs & imaging results that were available during my care of the patient were reviewed by me and considered in my medical decision making (see chart for details).    MDM Rules/Calculators/A&P                           MDM  MSE complete  Clarence Davis was evaluated in Emergency Department on 03/05/2021 for the symptoms described in the history of present illness. He was evaluated in the context of the global COVID-19 pandemic, which necessitated consideration that the patient might be at risk for infection with the SARS-CoV-2 virus that causes COVID-19. Institutional protocols and algorithms that pertain to the evaluation of patients at risk for COVID-19 are in a state of rapid change based on information released by regulatory bodies including the CDC and federal and state organizations. These policies and algorithms were followed during the patient's care in the ED.  Patient is presenting with complaint of longstanding chronic abdominal discomfort.  Patient reports that he has not yet been able to establish care with outpatient GI.  His initial complaint included request that he be evaluated by GI here in the ED.  He is requesting  endoscopy.  Patient is reassured by these findings.  He does understand need for close follow-up in the outpatient setting.  Referrals made to local GI here in [place].  Patient's labs are reassuringly normal.  CT imaging obtained of the abdomen pelvis is without acute pathology.    Strict return precautions given and understood.   Final Clinical Impression(s) / ED Diagnoses Final diagnoses:  Generalized abdominal pain    Rx / DC Orders ED Discharge Orders          Ordered    Ambulatory referral to Gastroenterology        03/05/21 1917    dicyclomine (BENTYL) 20 MG tablet  2 times daily        03/05/21 1918             Valarie Merino, MD 03/05/21 [place]

## 2021-03-05 NOTE — ED Triage Notes (Signed)
Per [facility] pt having generalized abdominal pain x 2 months. Has seen his PCP and was supposed to have bentyl refill but has not been ordered. Reports constant sharp pain radiating into back. Denies fevers.

## 2021-03-25 ENCOUNTER — Other Ambulatory Visit: Payer: Self-pay | Admitting: Legal Medicine

## 2021-04-30 ENCOUNTER — Telehealth: Payer: Self-pay

## 2021-04-30 NOTE — Chronic Care Management (AMB) (Signed)
° ° °  Chronic Care Management Pharmacy Assistant   Name: Clarence Davis  MRN: [id number] DOB: 11/19/1960   Reason for Encounter: General Adherence Call    Recent office visits:  None  Recent consult visits:  01/29/21 No notes available   01/08/21 No notes available   01/02/21 No notes available   12/30/20 No notes available   12/13/20 No notes available   12/02/20 No notes available   11/29/20 No notes available   10/29/20 No notes available   Hospital visits:  Medication Reconciliation was completed by comparing discharge summary, patients EMR and Pharmacy list, and upon discussion with patient.  Admitted to the hospital on 03/05/21 due to Abdominal Pain. Discharge date was 03/05/21. [facility] from [facility].    Medication Changes at Hospital Discharge: -Changed Dicyclomine HCI from 20 mg every 6 hours to 20 mg 2 times daily  -All other medications will remain the same.    Medications: Outpatient Encounter Medications as of 04/30/2021  Medication Sig   BELBUCA 150 MCG FILM    buprenorphine (BUTRANS) 10 MCG/HR PTWK    citalopram (CELEXA) 20 MG tablet TAKE 1 AND 1/2 TABLETS (30MG ) EVERY DAY   cyclobenzaprine (FLEXERIL) 10 MG tablet TAKE 1 TABLET TWICE DAILY  TO THREE TIMES DAILY AS NEEDED FOR MUSCLE SPASM(S)   diclofenac (VOLTAREN) 50 MG EC tablet Take by mouth.   dicyclomine (BENTYL) 20 MG tablet Take 1 tablet (20 mg total) by mouth every 6 (six) hours.   dicyclomine (BENTYL) 20 MG tablet Take 1 tablet (20 mg total) by mouth 2 (two) times daily.   lidocaine (LIDODERM) 5 %    Multiple Vitamin (MULTIVITAMIN WITH MINERALS) TABS tablet Take 1 tablet by mouth daily.   naloxegol oxalate (MOVANTIK) 25 MG TABS tablet Take 1 tablet (25 mg total) by mouth daily.   nortriptyline (PAMELOR) 10 MG capsule TAKE 1 CAPSULE(10 MG) BY MOUTH AT BEDTIME   omeprazole (PRILOSEC) 40 MG capsule TAKE 1 CAPSULE EVERY DAY   QUEtiapine (SEROQUEL) 50 MG tablet Take 1 tablet (50 mg  total) by mouth at bedtime.   simvastatin (ZOCOR) 40 MG tablet TAKE 1 TABLET (40 MG TOTAL) BY MOUTH DAILY AT 6 PM.   tamsulosin (FLOMAX) 0.4 MG CAPS capsule TAKE 1 CAPSULE  BY MOUTH BEFORE BED.   No facility-administered encounter medications on file as of 04/30/2021.   Contacted [name] for general disease state and medication adherence call.   Patient is > 5 days past due for refill on the following medications per chart history:  Star Medications: Medication Name/mg Last Fill Days Supply Simvastatin 40mg   11/20/20 90ds  Care Gaps: Last annual wellness visit?None noted  Colonoscopy: 10/29/34 If applicable:N/A Last eye exam / retinopathy screening? Diabetic foot exam?  Elray Mcgregor, [place] Pharmacist Assistant  [phone]  Unable to reach pt after several attempts

## 2021-08-17 ENCOUNTER — Other Ambulatory Visit: Payer: Self-pay | Admitting: Legal Medicine

## 2021-08-17 DIAGNOSIS — K2100 Gastro-esophageal reflux disease with esophagitis, without bleeding: Secondary | ICD-10-CM

## 2023-01-11 ENCOUNTER — Ambulatory Visit: Payer: Medicare HMO

## 2023-01-21 ENCOUNTER — Ambulatory Visit (INDEPENDENT_AMBULATORY_CARE_PROVIDER_SITE_OTHER): Payer: Medicare HMO

## 2023-01-21 VITALS — BP 118/72 | HR 66 | Temp 97.8°F | Ht 70.0 in | Wt 164.0 lb

## 2023-01-21 DIAGNOSIS — E785 Hyperlipidemia, unspecified: Secondary | ICD-10-CM

## 2023-01-21 DIAGNOSIS — G8929 Other chronic pain: Secondary | ICD-10-CM

## 2023-01-21 DIAGNOSIS — Z125 Encounter for screening for malignant neoplasm of prostate: Secondary | ICD-10-CM

## 2023-01-21 DIAGNOSIS — F17200 Nicotine dependence, unspecified, uncomplicated: Secondary | ICD-10-CM

## 2023-01-21 DIAGNOSIS — M4802 Spinal stenosis, cervical region: Secondary | ICD-10-CM | POA: Diagnosis not present

## 2023-01-21 DIAGNOSIS — F17210 Nicotine dependence, cigarettes, uncomplicated: Secondary | ICD-10-CM | POA: Diagnosis not present

## 2023-01-21 DIAGNOSIS — R109 Unspecified abdominal pain: Secondary | ICD-10-CM

## 2023-01-21 DIAGNOSIS — M549 Dorsalgia, unspecified: Secondary | ICD-10-CM

## 2023-01-21 DIAGNOSIS — K219 Gastro-esophageal reflux disease without esophagitis: Secondary | ICD-10-CM

## 2023-01-21 DIAGNOSIS — N401 Enlarged prostate with lower urinary tract symptoms: Secondary | ICD-10-CM

## 2023-01-21 DIAGNOSIS — R739 Hyperglycemia, unspecified: Secondary | ICD-10-CM

## 2023-01-21 DIAGNOSIS — N138 Other obstructive and reflux uropathy: Secondary | ICD-10-CM

## 2023-01-21 DIAGNOSIS — K2100 Gastro-esophageal reflux disease with esophagitis, without bleeding: Secondary | ICD-10-CM

## 2023-01-21 HISTORY — DX: Gastro-esophageal reflux disease without esophagitis: K21.9

## 2023-01-21 HISTORY — DX: Hyperglycemia, unspecified: R73.9

## 2023-01-21 HISTORY — DX: Nicotine dependence, unspecified, uncomplicated: F17.200

## 2023-01-21 HISTORY — DX: Hyperlipidemia, unspecified: E78.5

## 2023-01-21 HISTORY — DX: Encounter for screening for malignant neoplasm of prostate: Z12.5

## 2023-01-21 HISTORY — DX: Dorsalgia, unspecified: M54.9

## 2023-01-21 HISTORY — DX: Other chronic pain: G89.29

## 2023-01-21 MED ORDER — CITALOPRAM HYDROBROMIDE 20 MG PO TABS: 20.0000 mg | ORAL_TABLET | Freq: Every day | ORAL | 1 refills | Status: DC

## 2023-01-21 MED ORDER — SIMVASTATIN 40 MG PO TABS: 40.0000 mg | ORAL_TABLET | Freq: Every day | ORAL | 1 refills | Status: DC

## 2023-01-21 MED ORDER — OMEPRAZOLE 40 MG PO CPDR: 40.0000 mg | DELAYED_RELEASE_CAPSULE | Freq: Two times a day (BID) | ORAL | 0 refills | Status: DC

## 2023-01-21 NOTE — Progress Notes (Signed)
Subjective:  Patient ID: Clarence Davis, male    DOB: May 29, 1960  Age: 62 y.o. MRN: [id number]  Chief Complaint  Patient presents with   Medical Management of Chronic Issues    HPI   Hyperlipidemia: Taking Simvastatin mg 1 tablet daily.   GERD: Taking Omeprazole 20 mg 1 capsule daily.   Depression: Taking Citalopram 20 mg daily.  Has had neck and low back surgeries, last one in 2016.  States his upper and mid back hurts now. Does lifting and twisting.  Is retired. Does gardening, cooks and cleans.   Walks regularly.  Last PT not in several years.  Reports urinary urgency. Takes flomax 0.4 mg. Not helping. Wakes up at least 3 times at night.  Has night tremors. Reports trouble sleeping. Night mares   States he has epigastric pain going up into his chest all day long. Has been happening for 2-3 years. Not related to food. No diarrhea, no constipation. Has hemorrhoids, occasional blood. No chest pain. DOES REPORT OCCASIONAL EPIGASTRIC OR CHEST PAIN WHEN WALKING OR GARDENING.   Last alcohol intake was 15 years ago Smokes 1 pack a day.               01/21/2023    8:34 AM  Depression screen PHQ 2/9  Decreased Interest 0  Down, Depressed, Hopeless 0  PHQ - 2 Score 0  Altered sleeping 3  Tired, decreased energy 3  Change in appetite 3  Feeling bad or failure about yourself  0  Trouble concentrating 0  Moving slowly or fidgety/restless 0  Suicidal thoughts 0  PHQ-9 Score 9  Difficult doing work/chores Very difficult        01/21/2023    8:34 AM  Fall Risk   Falls in the past year? 0  Number falls in past yr: 0  Injury with Fall? 0  Risk for fall due to : No Fall Risks  Follow up Falls evaluation completed    Patient Care Team: Windell Moment, MD as PCP - General (Family Medicine) Drema Dallas, DO as Consulting Physician (Neurology)   Review of Systems  Constitutional:  Negative for appetite change, fatigue and fever.  HENT:  Negative for  congestion, ear pain, sinus pressure and sore throat.   Respiratory:  Negative for cough, chest tightness, shortness of breath and wheezing.   Cardiovascular:  Negative for chest pain and palpitations.  Gastrointestinal:  Positive for abdominal pain. Negative for constipation, diarrhea, nausea and vomiting.  Genitourinary:  Negative for dysuria and hematuria.  Musculoskeletal:  Positive for arthralgias, back pain, myalgias and neck pain. Negative for joint swelling.  Skin:  Negative for rash.  Neurological:  Negative for dizziness, weakness and headaches.  Psychiatric/Behavioral:  Positive for sleep disturbance. Negative for dysphoric mood. The patient is not nervous/anxious.     Current Outpatient Medications on File Prior to Visit  Medication Sig Dispense Refill   Multiple Vitamin (MULTIVITAMIN WITH MINERALS) TABS tablet Take 1 tablet by mouth daily.     tamsulosin (FLOMAX) 0.4 MG CAPS capsule TAKE 1 CAPSULE  BY MOUTH BEFORE BED. 90 capsule 2   No current facility-administered medications on file prior to visit.   Past Medical History:  Diagnosis Date   Alcoholic (HCC)    quit drinking many yrs ago per pt   Anxiety    Arthritis    "hands" (04/05/2015)   Bilateral carpal tunnel syndrome    Chronic back pain    "all over"   Chronic diarrhea  Depression    takes Citalopram daily   Dry skin    Dysrhythmia    PVC's    GERD (gastroesophageal reflux disease)    takes Omeprazole daily   Head injury    early 90's   Headache    "weekly" (04/05/2015)   Hepatitis C    "treated w/RX; don't have it anymore" (04/05/2015)   Past Surgical History:  Procedure Laterality Date   ANTERIOR CERVICAL DECOMP/DISCECTOMY FUSION N/A 12/15/2013   Procedure: CERVICAL FOUR TO FIVE, CERVICAL FIVE TO SIX, CERVICAL SIX TO SEVEN CERVICAL DECOMPRESSION/DISCECTOMY FUSION 3 LEVELS;  Surgeon: Karn Cassis, MD;  Location: [facility];  Service: Neurosurgery;  Laterality: N/A;  C4-5 C5-6 C6-7 Anterior  cervical decompression/diskectomy/fusion   BACK SURGERY     CLOSED REDUCTION SHOULDER DISLOCATION Left 1975   patient denies   ESOPHAGOGASTRODUODENOSCOPY     HERNIA REPAIR     INGUINAL HERNIA REPAIR Left X 3   "   POSTERIOR CERVICAL FUSION/FORAMINOTOMY  04/05/2015   POSTERIOR CERVICAL FUSION/FORAMINOTOMY N/A 04/05/2015   Procedure: Cervical three-four, Cervical four-five, Cervical five-six, Cervical six-seven Posterior cervical fusion with lateral mass fixation;  Surgeon: Hilda Lias, MD;  Location: [facility];  Service: Neurosurgery;  Laterality: N/A;  C3-4 C4-5 C5-6 C6-7 Posterior cervical fusion with lateral mass fixation   UMBILICAL HERNIA REPAIR     WISDOM TOOTH EXTRACTION      Family History  Problem Relation Age of Onset   Throat cancer Father    Heart disease Sister    Social History   Socioeconomic History   Marital status: Married    Spouse name: Not on file   Number of children: 2   Years of education: Not on file   Highest education level: 12th grade  Occupational History   Occupation: disabled  Tobacco Use   Smoking status: Some Days    Current packs/day: 0.00    Average packs/day: 0.2 packs/day for 28.0 years (4.2 ttl pk-yrs)    Types: E-cigarettes, Cigarettes    Start date: 02/25/1987    Last attempt to quit: 02/25/2015    Years since quitting: 7.9   Smokeless tobacco: Never  Substance and Sexual Activity   Alcohol use: Never    Comment: 04/05/2015 "recovering alcoholic; last drink 01/06/2011"   Drug use: Yes    Types: Marijuana   Sexual activity: Not Currently  Other Topics Concern   Not on file  Social History Narrative   Patient is left-handed. He lives with his long time girlfriend Arlington Calix in a one level home home. He drinks 3 cups of coffee and 4 Coca-Colas a day. He tries to walk when able.   Social Determinants of Health   Financial Resource Strain: Not on file  Food Insecurity: Not on file  Transportation Needs: Not on file   Physical Activity: Not on file  Stress: Not on file  Social Connections: Not on file    Objective:  BP 118/72 (BP Location: Left Arm, Patient Position: Sitting)   Pulse 66   Temp 97.8 F (36.6 C) (Oral)   Ht 5\' 10"  (1.778 m)   Wt 164 lb (74.4 kg)   SpO2 98%   BMI 23.53 kg/m      01/21/2023    8:33 AM 03/05/2021    7:28 PM 03/05/2021    2:34 PM  BP/Weight  Systolic BP 118 117 103  Diastolic BP 72 69 89  Wt. (Lbs) 164    BMI 23.53 kg/m2  Physical Exam Constitutional:      General: He is in acute distress (abdominal and back pain).  HENT:     Head: Normocephalic and atraumatic.     Mouth/Throat:     Comments: Dentures was  both jaws Cardiovascular:     Rate and Rhythm: Normal rate and regular rhythm.  Pulmonary:     Effort: Pulmonary effort is normal.     Breath sounds: Normal breath sounds.  Abdominal:     Tenderness: There is abdominal tenderness (Epigastric tenderness noted, no guarding or rigidity).  Musculoskeletal:        General: Tenderness (cervical and thoracic spine) and deformity (hardware noted in neck and lower back) present.  Neurological:     General: No focal deficit present.     Mental Status: He is alert and oriented to person, place, and time.  Psychiatric:        Mood and Affect: Mood normal.        Behavior: Behavior normal.     Diabetic Foot Exam - Simple   No data filed      Lab Results  Component Value Date   WBC 6.2 03/05/2021   HGB 15.1 03/05/2021   HCT 44.4 03/05/2021   PLT 162 03/05/2021   GLUCOSE 103 (H) 03/05/2021   ALT 24 03/05/2021   AST 25 03/05/2021   NA 138 03/05/2021   K 3.4 (L) 03/05/2021   CL 104 03/05/2021   CREATININE 0.98 03/05/2021   BUN 6 03/05/2021   CO2 24 03/05/2021      Assessment & Plan:    Cervical stenosis of spinal canal Assessment & Plan: Patient has known cervical stenosis, history of spinal surgery. Reports being in pain in his neck and upper back Has had injections in his back  in the past.  Is hoping for the same again.  On exam he has tenderness in his cervical spine, thoracic and lumbar spine Hardware noted.  Plan: Will place referral for physical therapy and pain clinic for consideration for injections. He does not wish to have any pain medication on board due to his history of opioid dependence  Orders: -     Ambulatory referral to Pain Clinic -     Ambulatory referral to Physical Therapy  Enlarged prostate with urinary obstruction Assessment & Plan: Has BPH, currently on Flomax 0.4 mg once daily. States he continues to have urinary urgency and nocturia. But he also states that he eats right before going to bed and wakes up at midnight to smoke.  Plan: For now, we will have him continue Flomax at 0.4 mg once daily, advised to cut back on smoking at night or in the middle of the night and cut back on fluid intake before going to bed -Will possibly increase the dose of Flomax to 0.4 mg twice daily or add finasteride if symptoms continue to bother him.  Orders: -     CBC with Differential/Platelet  Chronic GERD Assessment & Plan: Reports chronic GERD, takes Prilosec 40 mg once daily. Does report severe abdominal pain and epigastric pain from time to time.  Plan: Advised to cut back on his tobacco use, avoid eating right before going to bed, increase water intake, increase Prilosec to 40 mg twice daily for now. Placed gastroenterology referral for consideration for evaluation of his epigastric pain, GERD and possible esophageal dilatation as he reports having 1 couple years ago.  Orders: -     PSA -     Ambulatory referral  to Gastroenterology  Chronic abdominal pain Assessment & Plan: Reports chronic intermittent abdominal pain, mainly epigastric. As mentioned in the HPI, this does not seem to be related to his food intake, no bowel changes, no nausea or groundglass emesis.  Denies blood in his stools. He did have significant tenderness to palpation  in his epigastric region. Since this is a chronic intermittent problem and nothing new, I do not feel the need to send him to the emergency room right now.  Will place urgent gastroenterology referral for him to be evaluated, have an upper GI endoscopy and possible esophageal dilatation especially as he also reports difficulty swallowing at times  Orders: -     CBC with Differential/Platelet -     Ambulatory referral to Gastroenterology  Elevated blood sugar Assessment & Plan: He had elevated blood sugar on his last blood work couple years ago.  Will check hemoglobin A1c to rule out diabetes  Orders: -     Hemoglobin A1c -     CBC with Differential/Platelet  Hyperlipidemia LDL goal <130 Assessment & Plan: He has known hyperlipidemia, takes simvastatin 40 mg daily. Ordered lipid panel to look into this  Orders: -     Lipid panel -     Comprehensive metabolic panel -     CBC with Differential/Platelet  Screening for prostate cancer Assessment & Plan: Ordered screening PSA for screening for prostate cancer  Orders: -     PSA  Tobacco use disorder Assessment & Plan: Approximately 36 pack years of smoking. Continues to smoke 1 pack a day.  States he understands the harmful effects of smoking and tobacco use but does not want to quit. Advised to at least try to cut back. Ordered low-dose lung cancer CT scan for screening for lung cancer Time spent counseling 6 minutes.  Orders: -     CT CHEST LUNG CANCER SCREENING LOW DOSE WO CONTRAST; Future -     Ambulatory referral to Gastroenterology  Chronic mid back pain Assessment & Plan: Chronic mid back pain.  Tenderness in the thoracic and bilateral paraspinal regions on palpation.  As mentioned above, referral to pain clinic and physical therapy placed   Gastroesophageal reflux disease with esophagitis without hemorrhage -     Omeprazole; Take 1 capsule (40 mg total) by mouth 2 (two) times daily.  Dispense: 180 capsule;  Refill: 0  Other orders -     Citalopram Hydrobromide; Take 1 tablet (20 mg total) by mouth daily.  Dispense: 90 tablet; Refill: 1 -     Simvastatin; Take 1 tablet (40 mg total) by mouth daily.  Dispense: 90 tablet; Refill: 1     Meds ordered this encounter  Medications   citalopram (CELEXA) 20 MG tablet    Sig: Take 1 tablet (20 mg total) by mouth daily.    Dispense:  90 tablet    Refill:  1   omeprazole (PRILOSEC) 40 MG capsule    Sig: Take 1 capsule (40 mg total) by mouth 2 (two) times daily.    Dispense:  180 capsule    Refill:  0   simvastatin (ZOCOR) 40 MG tablet    Sig: Take 1 tablet (40 mg total) by mouth daily.    Dispense:  90 tablet    Refill:  1    Orders Placed This Encounter  Procedures   CT CHEST LUNG CA SCREEN LOW DOSE W/O CM   Hemoglobin A1c   Lipid panel   Comprehensive metabolic panel  PSA   CBC with Differential/Platelet   Ambulatory referral to Pain Clinic   Ambulatory referral to Physical Therapy   Ambulatory referral to Gastroenterology     Follow-up: Return in about 8 weeks (around 03/18/2023).  Total time spent on today's visit was greater than 45 minutes, including both face-to-face time and nonface-to-face time personally spent on review of chart (labs and imaging), discussing labs and goals, discussing further work-up, treatment options, referrals to specialist if needed, reviewing outside records of pertinent, answering patient's questions, and coordinating care.   I,Lauren M Auman,acting as a Neurosurgeon for [employer], MD.,have documented all relevant documentation on the behalf of Windell Moment, MD,as directed by  Windell Moment, MD while in the presence of Windell Moment, MD.   An After Visit Summary was printed and given to the patient.  Windell Moment, MD [facility] [phone]

## 2023-01-21 NOTE — Assessment & Plan Note (Signed)
Chronic mid back pain.  Tenderness in the thoracic and bilateral paraspinal regions on palpation.  As mentioned above, referral to pain clinic and physical therapy placed

## 2023-01-21 NOTE — Assessment & Plan Note (Signed)
Reports chronic intermittent abdominal pain, mainly epigastric. As mentioned in the HPI, this does not seem to be related to his food intake, no bowel changes, no nausea or groundglass emesis.  Denies blood in his stools. He did have significant tenderness to palpation in his epigastric region. Since this is a chronic intermittent problem and nothing new, I do not feel the need to send him to the emergency room right now.  Will place urgent gastroenterology referral for him to be evaluated, have an upper GI endoscopy and possible esophageal dilatation especially as he also reports difficulty swallowing at times

## 2023-01-21 NOTE — Assessment & Plan Note (Signed)
Reports chronic GERD, takes Prilosec 40 mg once daily. Does report severe abdominal pain and epigastric pain from time to time.  Plan: Advised to cut back on his tobacco use, avoid eating right before going to bed, increase water intake, increase Prilosec to 40 mg twice daily for now. Placed gastroenterology referral for consideration for evaluation of his epigastric pain, GERD and possible esophageal dilatation as he reports having 1 couple years ago.

## 2023-01-21 NOTE — Assessment & Plan Note (Signed)
Has BPH, currently on Flomax 0.4 mg once daily. States he continues to have urinary urgency and nocturia. But he also states that he eats right before going to bed and wakes up at midnight to smoke.  Plan: For now, we will have him continue Flomax at 0.4 mg once daily, advised to cut back on smoking at night or in the middle of the night and cut back on fluid intake before going to bed -Will possibly increase the dose of Flomax to 0.4 mg twice daily or add finasteride if symptoms continue to bother him.

## 2023-01-21 NOTE — Assessment & Plan Note (Signed)
Patient has known cervical stenosis, history of spinal surgery. Reports being in pain in his neck and upper back Has had injections in his back in the past.  Is hoping for the same again.  On exam he has tenderness in his cervical spine, thoracic and lumbar spine Hardware noted.  Plan: Will place referral for physical therapy and pain clinic for consideration for injections. He does not wish to have any pain medication on board due to his history of opioid dependence

## 2023-01-21 NOTE — Assessment & Plan Note (Signed)
Ordered screening PSA for screening for prostate cancer

## 2023-01-21 NOTE — Assessment & Plan Note (Signed)
He has known hyperlipidemia, takes simvastatin 40 mg daily. Ordered lipid panel to look into this

## 2023-01-21 NOTE — Patient Instructions (Addendum)
Blood work Referral to pain clinic and physical therapy placed. Ordered lung cancer screening CT scan Will place an urgent referral to gastroenterology for possible stretching of your food pipe Cut back on the number of cigarettes Increase the omeprazole to twice daily. Return in 2 months or sooner

## 2023-01-21 NOTE — Assessment & Plan Note (Signed)
He had elevated blood sugar on his last blood work couple years ago.  Will check hemoglobin A1c to rule out diabetes

## 2023-01-21 NOTE — Assessment & Plan Note (Signed)
Approximately 36 pack years of smoking. Continues to smoke 1 pack a day.  States he understands the harmful effects of smoking and tobacco use but does not want to quit. Advised to at least try to cut back. Ordered low-dose lung cancer CT scan for screening for lung cancer Time spent counseling 6 minutes.

## 2023-01-22 ENCOUNTER — Encounter: Payer: Self-pay | Admitting: Nurse Practitioner

## 2023-01-22 LAB — COMPREHENSIVE METABOLIC PANEL
ALT: 23 [IU]/L (ref 0–44)
AST: 21 [IU]/L (ref 0–40)
Albumin: 4.6 g/dL (ref 3.9–4.9)
Alkaline Phosphatase: 78 [IU]/L (ref 44–121)
BUN/Creatinine Ratio: 9 — ABNORMAL LOW (ref 10–24)
BUN: 9 mg/dL (ref 8–27)
Bilirubin Total: 0.4 mg/dL (ref 0.0–1.2)
CO2: 23 mmol/L (ref 20–29)
Calcium: 9.4 mg/dL (ref 8.6–10.2)
Chloride: 99 mmol/L (ref 96–106)
Creatinine, Ser: 0.97 mg/dL (ref 0.76–1.27)
Globulin, Total: 2.3 g/dL (ref 1.5–4.5)
Glucose: 77 mg/dL (ref 70–99)
Potassium: 4.2 mmol/L (ref 3.5–5.2)
Sodium: 138 mmol/L (ref 134–144)
Total Protein: 6.9 g/dL (ref 6.0–8.5)
eGFR: 89 mL/min/{1.73_m2} (ref >59)

## 2023-01-22 LAB — CBC WITH DIFFERENTIAL/PLATELET
Basophils Absolute: 0 10*3/uL (ref 0.0–0.2)
Basos: 1 %
EOS (ABSOLUTE): 0.1 10*3/uL (ref 0.0–0.4)
Eos: 2 %
Hematocrit: 47.6 % (ref 37.5–51.0)
Hemoglobin: 15.6 g/dL (ref 13.0–17.7)
Immature Grans (Abs): 0 10*3/uL (ref 0.0–0.1)
Immature Granulocytes: 0 %
Lymphocytes Absolute: 1.3 10*3/uL (ref 0.7–3.1)
Lymphs: 26 %
MCH: 32.8 pg (ref 26.6–33.0)
MCHC: 32.8 g/dL (ref 31.5–35.7)
MCV: 100 fL — ABNORMAL HIGH (ref 79–97)
Monocytes Absolute: 0.5 10*3/uL (ref 0.1–0.9)
Monocytes: 10 %
Neutrophils Absolute: 3 10*3/uL (ref 1.4–7.0)
Neutrophils: 61 %
Platelets: 156 10*3/uL (ref 150–450)
RBC: 4.75 x10E6/uL (ref 4.14–5.80)
RDW: 13.2 % (ref 11.6–15.4)
WBC: 4.8 10*3/uL (ref 3.4–10.8)

## 2023-01-22 LAB — LIPID PANEL
Chol/HDL Ratio: 2.8 {ratio} (ref 0.0–5.0)
Cholesterol, Total: 152 mg/dL (ref 100–199)
HDL: 55 mg/dL (ref >39)
LDL Chol Calc (NIH): 87 mg/dL (ref 0–99)
Triglycerides: 44 mg/dL (ref 0–149)
VLDL Cholesterol Cal: 10 mg/dL (ref 5–40)

## 2023-01-22 LAB — PSA: Prostate Specific Ag, Serum: 0.7 ng/mL (ref 0.0–4.0)

## 2023-01-22 LAB — HEMOGLOBIN A1C
Est. average glucose Bld gHb Est-mCnc: 117 mg/dL
Hgb A1c MFr Bld: 5.7 % — ABNORMAL HIGH (ref 4.8–5.6)

## 2023-01-25 ENCOUNTER — Telehealth: Payer: Self-pay

## 2023-01-25 NOTE — Telephone Encounter (Signed)
Patient's wife called stating that they received a phone call from the Gastroenterologist and the patient can't be seen until January. Patient's wife was concerned because the appointment is so far out. Please advise.

## 2023-01-25 NOTE — Telephone Encounter (Signed)
I will forward to amber to see if she knows of any other practices that she has been referring to may have sooner times/dates.

## 2023-01-28 NOTE — Telephone Encounter (Signed)
Patient call transferred to me, patient stated he felt like he was going to throw his insides up. Per Dr. Faylene Kurtz, if patient feels this bad he needs to be evaluated at the ED. Informed patient of this information to which he questioned, "if my nerves getting aggravated could cause this?". Told patient I would need to ask Dr. Faylene Kurtz, patient expressed frustration as he was under the impression that I already asked this prior to speaking with him..patient disconnected the call.  I did speak with Amber who is working on his referral to GI.

## 2023-02-18 ENCOUNTER — Ambulatory Visit (INDEPENDENT_AMBULATORY_CARE_PROVIDER_SITE_OTHER): Payer: Medicare HMO | Admitting: *Deleted

## 2023-02-18 DIAGNOSIS — Z0000 Encounter for general adult medical examination without abnormal findings: Secondary | ICD-10-CM | POA: Diagnosis not present

## 2023-02-18 NOTE — Patient Instructions (Signed)
Clarence Davis , Thank you for taking time to come for your Medicare Wellness Visit. I appreciate your ongoing commitment to your health goals. Please review the following plan we discussed and let me know if I can assist you in the future.   Screening recommendations/referrals: Colonoscopy: in process Recommended yearly ophthalmology/optometry visit for glaucoma screening and checkup Recommended yearly dental visit for hygiene and checkup  Vaccinations: Influenza vaccine:  Pneumococcal vaccine:  Tdap vaccine:  Shingles vaccine:       Preventive Care 40-64 Years, Male Preventive care refers to lifestyle choices and visits with your health care provider that can promote health and wellness. What does preventive care include? A yearly physical exam. This is also called an annual well check. Dental exams once or twice a year. Routine eye exams. Ask your health care provider how often you should have your eyes checked. Personal lifestyle choices, including: Daily care of your teeth and gums. Regular physical activity. Eating a healthy diet. Avoiding tobacco and drug use. Limiting alcohol use. Practicing safe sex. Taking low-dose aspirin every day starting at age 72. What happens during an annual well check? The services and screenings done by your health care provider during your annual well check will depend on your age, overall health, lifestyle risk factors, and family history of disease. Counseling  Your health care provider may ask you questions about your: Alcohol use. Tobacco use. Drug use. Emotional well-being. Home and relationship well-being. Sexual activity. Eating habits. Work and work Astronomer. Screening  You may have the following tests or measurements: Height, weight, and BMI. Blood pressure. Lipid and cholesterol levels. These may be checked every 5 years, or more frequently if you are over 77 years old. Skin check. Lung cancer screening. You may have this  screening every year starting at age 91 if you have a 30-pack-year history of smoking and currently smoke or have quit within the past 15 years. Fecal occult blood test (FOBT) of the stool. You may have this test every year starting at age 36. Flexible sigmoidoscopy or colonoscopy. You may have a sigmoidoscopy every 5 years or a colonoscopy every 10 years starting at age 82. Prostate cancer screening. Recommendations will vary depending on your family history and other risks. Hepatitis C blood test. Hepatitis B blood test. Sexually transmitted disease (STD) testing. Diabetes screening. This is done by checking your blood sugar (glucose) after you have not eaten for a while (fasting). You may have this done every 1-3 years. Discuss your test results, treatment options, and if necessary, the need for more tests with your health care provider. Vaccines  Your health care provider may recommend certain vaccines, such as: Influenza vaccine. This is recommended every year. Tetanus, diphtheria, and acellular pertussis (Tdap, Td) vaccine. You may need a Td booster every 10 years. Zoster vaccine. You may need this after age 7. Pneumococcal 13-valent conjugate (PCV13) vaccine. You may need this if you have certain conditions and have not been vaccinated. Pneumococcal polysaccharide (PPSV23) vaccine. You may need one or two doses if you smoke cigarettes or if you have certain conditions. Talk to your health care provider about which screenings and vaccines you need and how often you need them. This information is not intended to replace advice given to you by your health care provider. Make sure you discuss any questions you have with your health care provider. Document Released: 04/19/2015 Document Revised: 12/11/2015 Document Reviewed: 01/22/2015 Elsevier Interactive Patient Education  2017 [employer].  Fall Prevention in the  Home Falls can cause injuries. They can happen to people of all ages. There  are many things you can do to make your home safe and to help prevent falls. What can I do on the outside of my home? Regularly fix the edges of walkways and driveways and fix any cracks. Remove anything that might make you trip as you walk through a door, such as a raised step or threshold. Trim any bushes or trees on the path to your home. Use bright outdoor lighting. Clear any walking paths of anything that might make someone trip, such as rocks or tools. Regularly check to see if handrails are loose or broken. Make sure that both sides of any steps have handrails. Any raised decks and porches should have guardrails on the edges. Have any leaves, snow, or ice cleared regularly. Use sand or salt on walking paths during winter. Clean up any spills in your garage right away. This includes oil or grease spills. What can I do in the bathroom? Use night lights. Install grab bars by the toilet and in the tub and shower. Do not use towel bars as grab bars. Use non-skid mats or decals in the tub or shower. If you need to sit down in the shower, use a plastic, non-slip stool. Keep the floor dry. Clean up any water that spills on the floor as soon as it happens. Remove soap buildup in the tub or shower regularly. Attach bath mats securely with double-sided non-slip rug tape. Do not have throw rugs and other things on the floor that can make you trip. What can I do in the bedroom? Use night lights. Make sure that you have a light by your bed that is easy to reach. Do not use any sheets or blankets that are too big for your bed. They should not hang down onto the floor. Have a firm chair that has side arms. You can use this for support while you get dressed. Do not have throw rugs and other things on the floor that can make you trip. What can I do in the kitchen? Clean up any spills right away. Avoid walking on wet floors. Keep items that you use a lot in easy-to-reach places. If you need to  reach something above you, use a strong step stool that has a grab bar. Keep electrical cords out of the way. Do not use floor polish or wax that makes floors slippery. If you must use wax, use non-skid floor wax. Do not have throw rugs and other things on the floor that can make you trip. What can I do with my stairs? Do not leave any items on the stairs. Make sure that there are handrails on both sides of the stairs and use them. Fix handrails that are broken or loose. Make sure that handrails are as long as the stairways. Check any carpeting to make sure that it is firmly attached to the stairs. Fix any carpet that is loose or worn. Avoid having throw rugs at the top or bottom of the stairs. If you do have throw rugs, attach them to the floor with carpet tape. Make sure that you have a light switch at the top of the stairs and the bottom of the stairs. If you do not have them, ask someone to add them for you. What else can I do to help prevent falls? Wear shoes that: Do not have high heels. Have rubber bottoms. Are comfortable and fit you well. Are closed  at the toe. Do not wear sandals. If you use a stepladder: Make sure that it is fully opened. Do not climb a closed stepladder. Make sure that both sides of the stepladder are locked into place. Ask someone to hold it for you, if possible. Clearly mark and make sure that you can see: Any grab bars or handrails. First and last steps. Where the edge of each step is. Use tools that help you move around (mobility aids) if they are needed. These include: Canes. Walkers. Scooters. Crutches. Turn on the lights when you go into a dark area. Replace any light bulbs as soon as they burn out. Set up your furniture so you have a clear path. Avoid moving your furniture around. If any of your floors are uneven, fix them. If there are any pets around you, be aware of where they are. Review your medicines with your doctor. Some medicines can make  you feel dizzy. This can increase your chance of falling. Ask your doctor what other things that you can do to help prevent falls. This information is not intended to replace advice given to you by your health care provider. Make sure you discuss any questions you have with your health care provider. Document Released: 01/17/2009 Document Revised: 08/29/2015 Document Reviewed: 04/27/2014 Elsevier Interactive Patient Education  2017 [employer].

## 2023-02-18 NOTE — Progress Notes (Signed)
Subjective:   Clarence Davis is a 62 y.o. male who presents for Medicare Annual/Subsequent preventive examination.  Visit Complete: Virtual I connected with  Clarence Davis on 02/18/23 by a audio enabled telemedicine application and verified that I am speaking with the correct person using two identifiers.  Patient Location: Home  Provider Location: Home Office  I discussed the limitations of evaluation and management by telemedicine. The patient expressed understanding and agreed to proceed.  Vital Signs: Because this visit was a virtual/telehealth visit, some criteria may be missing or patient reported. Any vitals not documented were not able to be obtained and vitals that have been documented are patient reported.  Patient Medicare AWV questionnaire was completed by the patient on 01-21-2023; I have confirmed that all information answered by patient is correct and no changes since this date.  Cardiac Risk Factors include: male gender;advanced age (>57men, >58 women);smoking/ tobacco exposure     Objective:    There were no vitals filed for this visit. There is no height or weight on file to calculate BMI.     02/18/2023    3:10 PM 08/12/2018    8:38 AM 02/26/2016   12:11 AM 02/19/2016    1:00 PM 04/05/2015    9:22 PM 04/02/2015   12:29 PM 12/15/2013   10:00 PM  Advanced Directives  Does Patient Have a Medical Advance Directive? No No No No No No No  Would patient like information on creating a medical advance directive? No - Patient declined Yes (MAU/Ambulatory/Procedural Areas - Information given) No - Patient declined No - patient declined information No - patient declined information Yes - Educational materials given     Current Medications (verified) Outpatient Encounter Medications as of 02/18/2023  Medication Sig   citalopram (CELEXA) 20 MG tablet Take 1 tablet (20 mg total) by mouth daily.   Multiple Vitamin (MULTIVITAMIN WITH MINERALS) TABS tablet Take 1 tablet by  mouth daily.   omeprazole (PRILOSEC) 40 MG capsule Take 1 capsule (40 mg total) by mouth 2 (two) times daily.   simvastatin (ZOCOR) 40 MG tablet Take 1 tablet (40 mg total) by mouth daily.   tamsulosin (FLOMAX) 0.4 MG CAPS capsule TAKE 1 CAPSULE  BY MOUTH BEFORE BED.   No facility-administered encounter medications on file as of 02/18/2023.    Allergies (verified) Tramadol   History: Past Medical History:  Diagnosis Date   Alcoholic (HCC)    quit drinking many yrs ago per pt   Anxiety    Arthritis    "hands" (04/05/2015)   Bilateral carpal tunnel syndrome    Chronic back pain    "all over"   Chronic diarrhea    Depression    takes Citalopram daily   Dry skin    Dysrhythmia    PVC's    GERD (gastroesophageal reflux disease)    takes Omeprazole daily   Head injury    early 90's   Headache    "weekly" (04/05/2015)   Hepatitis C    "treated w/RX; don't have it anymore" (04/05/2015)   Past Surgical History:  Procedure Laterality Date   ANTERIOR CERVICAL DECOMP/DISCECTOMY FUSION N/A 12/15/2013   Procedure: CERVICAL FOUR TO FIVE, CERVICAL FIVE TO SIX, CERVICAL SIX TO SEVEN CERVICAL DECOMPRESSION/DISCECTOMY FUSION 3 LEVELS;  Surgeon: Karn Cassis, MD;  Location: [facility];  Service: Neurosurgery;  Laterality: N/A;  C4-5 C5-6 C6-7 Anterior cervical decompression/diskectomy/fusion   BACK SURGERY     CLOSED REDUCTION SHOULDER DISLOCATION Left 1975   patient  denies   ESOPHAGOGASTRODUODENOSCOPY     HERNIA REPAIR     INGUINAL HERNIA REPAIR Left X 3   "   POSTERIOR CERVICAL FUSION/FORAMINOTOMY  04/05/2015   POSTERIOR CERVICAL FUSION/FORAMINOTOMY N/A 04/05/2015   Procedure: Cervical three-four, Cervical four-five, Cervical five-six, Cervical six-seven Posterior cervical fusion with lateral mass fixation;  Surgeon: Hilda Lias, MD;  Location: [facility];  Service: Neurosurgery;  Laterality: N/A;  C3-4 C4-5 C5-6 C6-7 Posterior cervical fusion with lateral mass fixation    UMBILICAL HERNIA REPAIR     WISDOM TOOTH EXTRACTION     Family History  Problem Relation Age of Onset   Throat cancer Father    Heart disease Sister    Social History   Socioeconomic History   Marital status: Married    Spouse name: Not on file   Number of children: 2   Years of education: Not on file   Highest education level: 12th grade  Occupational History   Occupation: disabled  Tobacco Use   Smoking status: Some Days    Current packs/day: 0.00    Average packs/day: 0.2 packs/day for 28.0 years (4.2 ttl pk-yrs)    Types: E-cigarettes, Cigarettes    Start date: 02/25/1987    Last attempt to quit: 02/25/2015    Years since quitting: 7.9   Smokeless tobacco: Never  Substance and Sexual Activity   Alcohol use: Never    Comment: 04/05/2015 "recovering alcoholic; last drink 01/06/2011"   Drug use: Yes    Types: Marijuana   Sexual activity: Not Currently  Other Topics Concern   Not on file  Social History Narrative   Patient is left-handed. He lives with his long time girlfriend Arlington Calix in a one level home home. He drinks 3 cups of coffee and 4 Coca-Colas a day. He tries to walk when able.   Social Determinants of Health   Financial Resource Strain: Low Risk  (02/18/2023)   Overall Financial Resource Strain (CARDIA)    Difficulty of Paying Living Expenses: Not hard at all  Food Insecurity: No Food Insecurity (02/18/2023)   Hunger Vital Sign    Worried About Running Out of Food in the Last Year: Never true    Ran Out of Food in the Last Year: Never true  Transportation Needs: No Transportation Needs (02/18/2023)   PRAPARE - Administrator, Civil Service (Medical): No    Lack of Transportation (Non-Medical): No  Physical Activity: Inactive (02/18/2023)   Exercise Vital Sign    Days of Exercise per Week: 0 days    Minutes of Exercise per Session: 0 min  Stress: No Stress Concern Present (02/18/2023)   [employer] of Occupational Health -  Occupational Stress Questionnaire    Feeling of Stress : Not at all  Social Connections: Moderately Integrated (02/18/2023)   Social Connection and Isolation Panel [NHANES]    Frequency of Communication with Friends and Family: More than three times a week    Frequency of Social Gatherings with Friends and Family: Three times a week    Attends Religious Services: Never    Active Member of Clubs or Organizations: Yes    Attends Engineer, structural: More than 4 times per year    Marital Status: Living with partner    Tobacco Counseling Ready to quit: Not Answered Counseling given: Not Answered   Clinical Intake:  Pre-visit preparation completed: Yes  Pain : No/denies pain     Diabetes: No  How often do you need  to have someone help you when you read instructions, pamphlets, or other written materials from your doctor or pharmacy?: 1 - Never  Interpreter Needed?: No  Information entered by :: Remi Haggard LPN   Activities of Daily Living    02/18/2023    3:18 PM  In your present state of health, do you have any difficulty performing the following activities:  Hearing? 0  Vision? 0  Difficulty concentrating or making decisions? 0  Walking or climbing stairs? 1  Dressing or bathing? 0  Doing errands, shopping? 0  Preparing Food and eating ? N  Using the Toilet? N  In the past six months, have you accidently leaked urine? N  Do you have problems with loss of bowel control? Y  Managing your Medications? N  Managing your Finances? N  Housekeeping or managing your Housekeeping? N    Patient Care Team: Windell Moment, MD as PCP - General (Family Medicine) Drema Dallas, DO as Consulting Physician (Neurology)  Indicate any recent Medical Services you may have received from other than [facility] providers in the past year (date may be approximate).     Assessment:   This is a routine wellness examination for [facility].  Hearing/Vision screen Hearing Screening -  Comments:: No trouble hearing Vision Screening - Comments:: Not up to date   Goals Addressed             This Visit's Progress    Patient Stated       Continue current lifestyle       Depression Screen    02/18/2023    3:15 PM 01/21/2023    8:34 AM  PHQ 2/9 Scores  PHQ - 2 Score 1 0  PHQ- 9 Score 4 9    Fall Risk    02/18/2023    3:08 PM 02/18/2023    3:06 PM 01/21/2023    8:34 AM 08/12/2018    8:38 AM  Fall Risk   Falls in the past year? 0 0 0 0  Number falls in past yr: 0  0   Injury with Fall? 0  0   Risk for fall due to : Impaired balance/gait  No Fall Risks   Follow up Falls evaluation completed;Education provided;Falls prevention discussed  Falls evaluation completed Falls evaluation completed    MEDICARE RISK AT HOME: Medicare Risk at Home If so, are there any without handrails?: Yes Home free of loose throw rugs in walkways, pet beds, electrical cords, etc?: No Adequate lighting in your home to reduce risk of falls?: Yes Life alert?: Yes Use of a cane, walker or w/c?: Yes Grab bars in the bathroom?: Yes Shower chair or bench in shower?: Yes Elevated toilet seat or a handicapped toilet?: Yes  TIMED UP AND GO:  Was the test performed?  No    Cognitive Function:        02/18/2023    3:12 PM  6CIT Screen  What Year? 0 points  What month? 0 points  What time? 0 points  Count back from 20 0 points  Months in reverse 0 points  Repeat phrase 0 points  Total Score 0 points    Immunizations Immunization History  Administered Date(s) Administered   Hepatitis A 10/24/2012   Hepatitis B 10/24/2012   PFIZER(Purple Top)SARS-COV-2 Vaccination 06/21/2019, 07/19/2019, 02/21/2020   Pneumococcal Conjugate-13 04/12/2014   Pneumococcal Polysaccharide-23 12/22/2017   Zoster, Live 10/29/2015    TDAP status: Due, Education has been provided regarding the importance of this vaccine. Advised  may receive this vaccine at local pharmacy or Health Dept. Aware  to provide a copy of the vaccination record if obtained from local pharmacy or Health Dept. Verbalized acceptance and understanding.  Flu Vaccine status: Declined, Education has been provided regarding the importance of this vaccine but patient still declined. Advised may receive this vaccine at local pharmacy or Health Dept. Aware to provide a copy of the vaccination record if obtained from local pharmacy or Health Dept. Verbalized acceptance and understanding.  Pneumococcal vaccine status: Declined,  Education has been provided regarding the importance of this vaccine but patient still declined. Advised may receive this vaccine at local pharmacy or Health Dept. Aware to provide a copy of the vaccination record if obtained from local pharmacy or Health Dept. Verbalized acceptance and understanding.   Covid-19 vaccine status: Declined, Education has been provided regarding the importance of this vaccine but patient still declined. Advised may receive this vaccine at local pharmacy or Health Dept.or vaccine clinic. Aware to provide a copy of the vaccination record if obtained from local pharmacy or Health Dept. Verbalized acceptance and understanding.  Qualifies for Shingles Vaccine? No   Zostavax completed No   Shingrix Completed?: No.    Education has been provided regarding the importance of this vaccine. Patient has been advised to call insurance company to determine out of pocket expense if they have not yet received this vaccine. Advised may also receive vaccine at local pharmacy or Health Dept. Verbalized acceptance and understanding.  Screening Tests Health Maintenance  Topic Date Due   HIV Screening  Never done   Colonoscopy  11/01/2022   COVID-19 Vaccine (4 - 2023-24 season) 03/06/2023 (Originally 12/06/2022)   Zoster Vaccines- Shingrix (1 of 2) 05/21/2023 (Originally [birth year]/1980)   INFLUENZA VACCINE  07/05/2023 (Originally 11/05/2022)   DTaP/Tdap/Td (1 - Tdap) 02/18/2024 (Originally  09/25/1979)   Medicare Annual Wellness (AWV)  02/18/2024   Hepatitis C Screening  Completed   HPV VACCINES  Aged Out    Health Maintenance  Health Maintenance Due  Topic Date Due   HIV Screening  Never done   Colonoscopy  11/01/2022    Colonoscopy  scheduled for 04-28-2023  Lung Cancer Screening: (Low Dose CT Chest recommended if Age 77-80 years, 20 pack-year currently smoking OR have quit w/in 15years.)  Lung Cancer Screening Referral:   Additional Screening:  Hepatitis C Screening: does not qualify; Completed 2016  Vision Screening: Recommended annual ophthalmology exams for early detection of glaucoma and other disorders of the eye. Is the patient up to date with their annual eye exam?  No  Who is the provider or what is the name of the office in which the patient attends annual eye exams?  If pt is not established with a provider, would they like to be referred to a provider to establish care? No .   Dental Screening: Recommended annual dental exams for proper oral hygiene    Community Resource Referral / Chronic Care Management: CRR required this visit?  No   CCM required this visit?  No     Plan:     I have personally reviewed and noted the following in the patient's chart:   Medical and social history Use of alcohol, tobacco or illicit drugs  Current medications and supplements including opioid prescriptions. Patient is not currently taking opioid prescriptions. Functional ability and status Nutritional status Physical activity Advanced directives List of other physicians Hospitalizations, surgeries, and ER visits in previous 12 months Vitals Screenings to include cognitive, depression,  and falls Referrals and appointments  In addition, I have reviewed and discussed with patient certain preventive protocols, quality metrics, and best practice recommendations. A written personalized care plan for preventive services as well as general preventive health  recommendations were provided to patient.     Remi Haggard, LPN   [id number]/01/[id number]   After Visit Summary: (MyChart) Due to this being a telephonic visit, the after visit summary with patients personalized plan was offered to patient via MyChart   Nurse Notes:

## 2023-02-24 ENCOUNTER — Ambulatory Visit: Payer: Self-pay

## 2023-02-24 NOTE — Telephone Encounter (Signed)
  Chief Complaint: ABD pain Symptoms: ABD worse with morning coffee, eating. Frequency: months Pertinent Negatives: Patient denies fevers, bowel issues, GU s/s Disposition: [x] ED /[] Urgent Care (no appt availability in office) / [x] Appointment(In office/virtual)/ []  [facility]/ [] Home Care/ [x] Refused Recommended Disposition /[] [facility]/ []  Follow-up with PCP Additional Notes: Pt c/o severe ABD pain. Advised to seek treatment in ED, refused. Attempted to redirect numerous times.  Scheduled appt for tomorrow. Pt will call back if cannot get to appt. Pt advised to call 911 if he decides he is willing to go the the ED. Would like rx for pain medication. Copied from CRM [id number]. Topic: Clinical - Red Word Triage >> Feb 24, 2023 12:30 PM Raven B wrote: Red Word that prompted transfer to Nurse Triage: PT has severe stomach pain Reason for Disposition  [1] SEVERE pain (e.g., excruciating) AND [2] present > 1 hour  Answer Assessment - Initial Assessment Questions 1. LOCATION: "Where does it hurt?"      All quadrants 2. RADIATION: "Does the pain shoot anywhere else?" (e.g., chest, back)     Starts with chest pain then progresses to ABD pain 3. ONSET: "When did the pain begin?" (Minutes, hours or days ago)      weeks 4. SUDDEN: "Gradual or sudden onset?"     Gradually getting worse  5. PATTERN "Does the pain come and go, or is it constant?"    - If it comes and goes: "How long does it last?" "Do you have pain now?"     (Note: Comes and goes means the pain is intermittent. It goes away completely between bouts.)    - If constant: "Is it getting better, staying the same, or getting worse?"      (Note: Constant means the pain never goes away completely; most serious pain is constant and gets worse.)      intermittent 6. SEVERITY: "How bad is the pain?"  (e.g., Scale 1-10; mild, moderate, or severe)    - MILD (1-3): Doesn't interfere with normal activities, abdomen soft and  not tender to touch.     - MODERATE (4-7): Interferes with normal activities or awakens from sleep, abdomen tender to touch.     - SEVERE (8-10): Excruciating pain, doubled over, unable to do any normal activities.       10 7. RECURRENT SYMPTOM: "Have you ever had this type of stomach pain before?" If Yes, ask: "When was the last time?" and "What happened that time?"      No dx 8. CAUSE: "What do you think is causing the stomach pain?"     ETOH use in past 9. RELIEVING/AGGRAVATING FACTORS: "What makes it better or worse?" (e.g., antacids, bending or twisting motion, bowel movement)     Yes "all the route" 10. OTHER SYMPTOMS: "Do you have any other symptoms?" (e.g., back pain, diarrhea, fever, urination pain, vomiting)       denies  Protocols used: Abdominal Pain - Male-A-AH

## 2023-02-25 ENCOUNTER — Ambulatory Visit: Payer: Medicare HMO | Admitting: Family Medicine

## 2023-03-18 ENCOUNTER — Ambulatory Visit: Payer: Medicare HMO

## 2023-03-18 VITALS — BP 102/60 | HR 75 | Temp 97.8°F | Resp 16 | Ht 70.0 in | Wt 166.2 lb

## 2023-03-18 DIAGNOSIS — M4802 Spinal stenosis, cervical region: Secondary | ICD-10-CM

## 2023-03-18 DIAGNOSIS — N401 Enlarged prostate with lower urinary tract symptoms: Secondary | ICD-10-CM | POA: Diagnosis not present

## 2023-03-18 DIAGNOSIS — R109 Unspecified abdominal pain: Secondary | ICD-10-CM | POA: Diagnosis not present

## 2023-03-18 DIAGNOSIS — N138 Other obstructive and reflux uropathy: Secondary | ICD-10-CM

## 2023-03-18 DIAGNOSIS — F4310 Post-traumatic stress disorder, unspecified: Secondary | ICD-10-CM | POA: Diagnosis not present

## 2023-03-18 DIAGNOSIS — G8929 Other chronic pain: Secondary | ICD-10-CM

## 2023-03-18 MED ORDER — DICYCLOMINE HCL 20 MG PO TABS: 20.0000 mg | ORAL_TABLET | Freq: Three times a day (TID) | ORAL | 0 refills | Status: DC

## 2023-03-18 NOTE — Assessment & Plan Note (Signed)
Chronic neck pain discussed on October 17th. Referral for physical therapy not followed up. Prefers [facility]. - will refax the referral and follow up on physical therapy referral  Offered muscle relaxant but he declined He wanted "5 mg of something" to help with the pain. He was hoping for percocet and I discouraged from initiating an opioid medication. He understands.

## 2023-03-18 NOTE — Patient Instructions (Signed)
Prescribed BENTYL TABS to take before meals to see if it helps with your abdominal pain See the gastroenterologist  Continue prilosec 40 mg twice daily Cut back on smoking further to gain weight No blood work today Return in 3 months

## 2023-03-18 NOTE — Progress Notes (Signed)
Subjective:  Patient ID: Clarence Davis, male    DOB: 1960-10-27  Age: 62 y.o. MRN: [id number]  Chief Complaint  Patient presents with   Medical Management of Chronic Issues    HPI   The patient, with a history of chronic neck pain, presented for a follow up. He also reports  a recent injury to the left knee. The injury occurred while working on a dryer, resulting in a burning sensation and subsequent hematoma formation. The patient sought immediate medical attention at a local hospital, where the hematoma was diagnosed and has since resolved.  The patient also reported ongoing struggles with abdominal pain, which is described as spasmodic and occurs unpredictably. The pain is severe enough to leave the patient in a fetal position, unable to speak or breathe.  The patient also expressed concerns about chronic pain in various parts of the body, esp his neck which is exacerbated by daily activities such as cooking, cleaning, and yard work.  The patient also reported interpersonal stress and nightmares, which have led to sleep disturbances. The patient has been sleeping in a recliner for the past two years due to these issues. The patient has a history of trauma and experiences PTSD symptoms, including fighting in their sleep.  The patient has a history of smoking and has been advised to cut back to gain weight. The patient's most recent blood work showed borderline diabetes.     02/18/2023    3:15 PM 01/21/2023    8:34 AM  Depression screen PHQ 2/9  Decreased Interest 1 0  Down, Depressed, Hopeless 0 0  PHQ - 2 Score 1 0  Altered sleeping 2 3  Tired, decreased energy 0 3  Change in appetite 1 3  Feeling bad or failure about yourself  0 0  Trouble concentrating 0 0  Moving slowly or fidgety/restless 0 0  Suicidal thoughts 0 0  PHQ-9 Score 4 9  Difficult doing work/chores Not difficult at all Very difficult        02/18/2023    3:08 PM  Fall Risk   Falls in the past year? 0   Number falls in past yr: 0  Injury with Fall? 0  Risk for fall due to : Impaired balance/gait  Follow up Falls evaluation completed;Education provided;Falls prevention discussed    Patient Care Team: Windell Moment, MD as PCP - General (Family Medicine) Drema Dallas, DO as Consulting Physician (Neurology)   Review of Systems  Constitutional: Negative.   HENT: Negative.    Eyes: Negative.   Respiratory: Negative.    Cardiovascular: Negative.   Gastrointestinal:  Positive for abdominal pain.  Endocrine: Negative.   Genitourinary: Negative.   Musculoskeletal:  Positive for neck pain.  Neurological: Negative.   Psychiatric/Behavioral:  Positive for sleep disturbance.     Current Outpatient Medications on File Prior to Visit  Medication Sig Dispense Refill   citalopram (CELEXA) 20 MG tablet Take 1 tablet (20 mg total) by mouth daily. 90 tablet 1   Multiple Vitamin (MULTIVITAMIN WITH MINERALS) TABS tablet Take 1 tablet by mouth daily.     omeprazole (PRILOSEC) 40 MG capsule Take 1 capsule (40 mg total) by mouth 2 (two) times daily. 180 capsule 0   simvastatin (ZOCOR) 40 MG tablet Take 1 tablet (40 mg total) by mouth daily. 90 tablet 1   tamsulosin (FLOMAX) 0.4 MG CAPS capsule TAKE 1 CAPSULE  BY MOUTH BEFORE BED. 90 capsule 2   No current facility-administered medications on file prior  to visit.   Past Medical History:  Diagnosis Date   Alcoholic (HCC)    quit drinking many yrs ago per pt   Anxiety    Arthritis    "hands" (04/05/2015)   Bilateral carpal tunnel syndrome    Chronic back pain    "all over"   Chronic diarrhea    Depression    takes Citalopram daily   Dry skin    Dysrhythmia    PVC's    GERD (gastroesophageal reflux disease)    takes Omeprazole daily   Head injury    early 90's   Headache    "weekly" (04/05/2015)   Hepatitis C    "treated w/RX; don't have it anymore" (04/05/2015)   Past Surgical History:  Procedure Laterality Date   ANTERIOR  CERVICAL DECOMP/DISCECTOMY FUSION N/A 12/15/2013   Procedure: CERVICAL FOUR TO FIVE, CERVICAL FIVE TO SIX, CERVICAL SIX TO SEVEN CERVICAL DECOMPRESSION/DISCECTOMY FUSION 3 LEVELS;  Surgeon: Karn Cassis, MD;  Location: [facility];  Service: Neurosurgery;  Laterality: N/A;  C4-5 C5-6 C6-7 Anterior cervical decompression/diskectomy/fusion   BACK SURGERY     CLOSED REDUCTION SHOULDER DISLOCATION Left 1975   patient denies   ESOPHAGOGASTRODUODENOSCOPY     HERNIA REPAIR     INGUINAL HERNIA REPAIR Left X 3   "   POSTERIOR CERVICAL FUSION/FORAMINOTOMY  04/05/2015   POSTERIOR CERVICAL FUSION/FORAMINOTOMY N/A 04/05/2015   Procedure: Cervical three-four, Cervical four-five, Cervical five-six, Cervical six-seven Posterior cervical fusion with lateral mass fixation;  Surgeon: Hilda Lias, MD;  Location: [facility];  Service: Neurosurgery;  Laterality: N/A;  C3-4 C4-5 C5-6 C6-7 Posterior cervical fusion with lateral mass fixation   UMBILICAL HERNIA REPAIR     WISDOM TOOTH EXTRACTION      Family History  Problem Relation Age of Onset   Throat cancer Father    Heart disease Sister    Social History   Socioeconomic History   Marital status: Married    Spouse name: Not on file   Number of children: 2   Years of education: Not on file   Highest education level: 12th grade  Occupational History   Occupation: disabled  Tobacco Use   Smoking status: Some Days    Current packs/day: 0.00    Average packs/day: 0.2 packs/day for 28.0 years (4.2 ttl pk-yrs)    Types: E-cigarettes, Cigarettes    Start date: 02/25/1987    Last attempt to quit: 02/25/2015    Years since quitting: 8.0   Smokeless tobacco: Never  Substance and Sexual Activity   Alcohol use: Never    Comment: 04/05/2015 "recovering alcoholic; last drink 01/06/2011"   Drug use: Yes    Types: Marijuana   Sexual activity: Not Currently  Other Topics Concern   Not on file  Social History Narrative   Patient is left-handed. He  lives with his long time girlfriend Clarence Davis in a one level home home. He drinks 3 cups of coffee and 4 Coca-Colas a day. He tries to walk when able.   Social Drivers of Corporate investment banker Strain: Low Risk  (02/18/2023)   Overall Financial Resource Strain (CARDIA)    Difficulty of Paying Living Expenses: Not hard at all  Food Insecurity: No Food Insecurity (02/18/2023)   Hunger Vital Sign    Worried About Running Out of Food in the Last Year: Never true    Ran Out of Food in the Last Year: Never true  Transportation Needs: No Transportation Needs (02/18/2023)  PRAPARE - Administrator, Civil Service (Medical): No    Lack of Transportation (Non-Medical): No  Physical Activity: Inactive (02/18/2023)   Exercise Vital Sign    Days of Exercise per Week: 0 days    Minutes of Exercise per Session: 0 min  Stress: No Stress Concern Present (02/18/2023)   [employer] of Occupational Health - Occupational Stress Questionnaire    Feeling of Stress : Not at all  Social Connections: Moderately Integrated (02/18/2023)   Social Connection and Isolation Panel [NHANES]    Frequency of Communication with Friends and Family: More than three times a week    Frequency of Social Gatherings with Friends and Family: Three times a week    Attends Religious Services: Never    Active Member of Clubs or Organizations: Yes    Attends Engineer, structural: More than 4 times per year    Marital Status: Living with partner    Objective:  BP 102/60 (BP Location: Left Arm, Patient Position: Sitting, Cuff Size: Normal)   Pulse 75   Temp 97.8 F (36.6 C) (Temporal)   Resp 16   Ht 5\' 10"  (1.778 m)   Wt 166 lb 3.2 oz (75.4 kg)   SpO2 97%   BMI 23.85 kg/m      03/18/2023    9:37 AM 01/21/2023    8:33 AM 03/05/2021    7:28 PM  BP/Weight  Systolic BP 102 118 117  Diastolic BP 60 72 69  Wt. (Lbs) 166.2 164   BMI 23.85 kg/m2 23.53 kg/m2     Physical Exam Vitals and  nursing note reviewed.  Constitutional:      Appearance: Normal appearance.  HENT:     Head: Normocephalic and atraumatic.  Cardiovascular:     Rate and Rhythm: Normal rate and regular rhythm.  Pulmonary:     Effort: Pulmonary effort is normal.     Breath sounds: Normal breath sounds.  Musculoskeletal:        General: Normal range of motion.     Cervical back: Tenderness present.     Comments: No hematoma noted on left knee  Neurological:     General: No focal deficit present.     Mental Status: He is alert.  Psychiatric:        Mood and Affect: Mood normal.     Diabetic Foot Exam - Simple   No data filed      Lab Results  Component Value Date   WBC 4.8 01/21/2023   HGB 15.6 01/21/2023   HCT 47.6 01/21/2023   PLT 156 01/21/2023   GLUCOSE 77 01/21/2023   CHOL 152 01/21/2023   TRIG 44 01/21/2023   HDL 55 01/21/2023   LDLCALC 87 01/21/2023   ALT 23 01/21/2023   AST 21 01/21/2023   NA 138 01/21/2023   K 4.2 01/21/2023   CL 99 01/21/2023   CREATININE 0.97 01/21/2023   BUN 9 01/21/2023   CO2 23 01/21/2023   HGBA1C 5.7 (H) 01/21/2023      Assessment & Plan:    Cervical stenosis of spinal canal Assessment & Plan: Chronic neck pain discussed on October 17th. Referral for physical therapy not followed up. Prefers [facility]. - will refax the referral and follow up on physical therapy referral  Offered muscle relaxant but he declined He wanted "5 mg of something" to help with the pain. He was hoping for percocet and I discouraged from initiating an opioid medication. He understands.  Enlarged prostate with urinary obstruction  Chronic abdominal pain Assessment & Plan: Reports chronic abdominal pain with spasms. Previous severe episodes noted. Bentyl prescribed for spasms. Upcoming gastroenterology appointment on December 22nd. Reassured that Bentyl will alleviate spasms without causing pain. - Prescribe Bentyl tablets before meals as needed  for spasms - Continue Prilosec 40 mg twice daily - Attend gastroenterology appointment on December 22nd    PTSD (post-traumatic stress disorder) Assessment & Plan: Reports PTSD and nightmares, leading to poor sleep. Agreed to see a counselor despite reluctance to see a psychiatrist. - Refer to a counselor in [place] for PTSD and nightmares   Borderline Diabetes Recent blood work indicated mild borderline diabetes. Discussed importance of lifestyle modifications. - Monitor blood glucose levels - Encourage lifestyle modifications including diet and exercise  General Health Maintenance Cholesterol levels were good in October. Discussed smoking cessation to aid in weight gain. Lung cancer screening CT scan ordered previously. - Encourage smoking cessation to aid in weight gain - Follow up on lung cancer screening CT scan  Follow-up - Schedule follow-up appointment in three months  Left Knee Hematoma Left knee injury five days ago resulted in a hematoma, now resolved. - No further action required.  Orders: -     Ambulatory referral to Psychology  Other orders -     Dicyclomine HCl; Take 1 tablet (20 mg total) by mouth 3 (three) times daily before meals for 20 days.  Dispense: 60 tablet; Refill: 0     Meds ordered this encounter  Medications   dicyclomine (BENTYL) 20 MG tablet    Sig: Take 1 tablet (20 mg total) by mouth 3 (three) times daily before meals for 20 days.    Dispense:  60 tablet    Refill:  0    Orders Placed This Encounter  Procedures   Ambulatory referral to Psychology     Follow-up: Return in about 3 months (around 06/16/2023).   I,Angela Taylor,acting as a Neurosurgeon for [employer], MD.,have documented all relevant documentation on the behalf of Windell Moment, MD,as directed by  Windell Moment, MD while in the presence of Windell Moment, MD.   An After Visit Summary was printed and given to the patient.  Windell Moment, MD [facility] [phone]

## 2023-03-18 NOTE — Assessment & Plan Note (Signed)
Reports chronic abdominal pain with spasms. Previous severe episodes noted. Bentyl prescribed for spasms. Upcoming gastroenterology appointment on December 22nd. Reassured that Bentyl will alleviate spasms without causing pain. - Prescribe Bentyl tablets before meals as needed for spasms - Continue Prilosec 40 mg twice daily - Attend gastroenterology appointment on December 22nd

## 2023-03-18 NOTE — Assessment & Plan Note (Addendum)
Reports PTSD and nightmares, leading to poor sleep. Agreed to see a counselor despite reluctance to see a psychiatrist. - Refer to a counselor in [place] for PTSD and nightmares   Borderline Diabetes Recent blood work indicated mild borderline diabetes. Discussed importance of lifestyle modifications. - Monitor blood glucose levels - Encourage lifestyle modifications including diet and exercise  General Health Maintenance Cholesterol levels were good in October. Discussed smoking cessation to aid in weight gain. Lung cancer screening CT scan ordered previously. - Encourage smoking cessation to aid in weight gain - Follow up on lung cancer screening CT scan  Follow-up - Schedule follow-up appointment in three months  Left Knee Hematoma Left knee injury five days ago resulted in a hematoma, now resolved. - No further action required.

## 2023-04-05 ENCOUNTER — Other Ambulatory Visit: Payer: Self-pay

## 2023-04-05 DIAGNOSIS — K2100 Gastro-esophageal reflux disease with esophagitis, without bleeding: Secondary | ICD-10-CM

## 2023-04-22 ENCOUNTER — Telehealth: Payer: Self-pay

## 2023-04-22 NOTE — Telephone Encounter (Signed)
Patient initial evaluation poc

## 2023-04-28 ENCOUNTER — Ambulatory Visit: Payer: Medicare HMO | Admitting: Nurse Practitioner

## 2023-05-31 NOTE — Progress Notes (Unsigned)
 Chief Complaint:chronic GERD and abdominal pain Primary GI Doctor:unassigned  HPI:  Patient is a  63  year old male patient with past medical history of chronic pain, PTSD,  anxiety, arthritis, Hepatitis C, GERD, history of alcohol abuse, who was referred to me by Windell Moment, MD on 01/21/23 for a complaint of chronic GERD and abdominal pain .    Interval History  Patient admits/denies GERD Patient taking Omeprazole 40 mg po daily Patient admits/denies dysphagia Patient admits/denies nausea, vomiting, or weight loss   Patient admits/denies altered bowel habits Patient admits/denies abdominal pain Patient prescribed Bentyl 20 mg to take before meals. Patient admits/denies rectal bleeding   Denies/Admits alcohol Denies/Admits smoking Denies/Admits NSAID use. Denies/Admits they are on blood thinners.  Patients last colonoscopy Patients last EGD  Patient's family history includes  Wt Readings from Last 3 Encounters:  03/18/23 166 lb 3.2 oz (75.4 kg)  01/21/23 164 lb (74.4 kg)  06/07/20 180 lb 3.2 oz (81.7 kg)      Past Medical History:  Diagnosis Date   Alcoholic (HCC)    quit drinking many yrs ago per pt   Anxiety    Arthritis    "hands" (04/05/2015)   Bilateral carpal tunnel syndrome    Chronic back pain    "all over"   Chronic diarrhea    Depression    takes Citalopram daily   Dry skin    Dysrhythmia    PVC's    GERD (gastroesophageal reflux disease)    takes Omeprazole daily   Head injury    early 90's   Headache    "weekly" (04/05/2015)   Hepatitis C    "treated w/RX; don't have it anymore" (04/05/2015)    Past Surgical History:  Procedure Laterality Date   ANTERIOR CERVICAL DECOMP/DISCECTOMY FUSION N/A 12/15/2013   Procedure: CERVICAL FOUR TO FIVE, CERVICAL FIVE TO SIX, CERVICAL SIX TO SEVEN CERVICAL DECOMPRESSION/DISCECTOMY FUSION 3 LEVELS;  Surgeon: Karn Cassis, MD;  Location: [facility];  Service: Neurosurgery;  Laterality: N/A;   C4-5 C5-6 C6-7 Anterior cervical decompression/diskectomy/fusion   BACK SURGERY     CLOSED REDUCTION SHOULDER DISLOCATION Left 1975   patient denies   ESOPHAGOGASTRODUODENOSCOPY     HERNIA REPAIR     INGUINAL HERNIA REPAIR Left X 3   "   POSTERIOR CERVICAL FUSION/FORAMINOTOMY  04/05/2015   POSTERIOR CERVICAL FUSION/FORAMINOTOMY N/A 04/05/2015   Procedure: Cervical three-four, Cervical four-five, Cervical five-six, Cervical six-seven Posterior cervical fusion with lateral mass fixation;  Surgeon: Hilda Lias, MD;  Location: [facility];  Service: Neurosurgery;  Laterality: N/A;  C3-4 C4-5 C5-6 C6-7 Posterior cervical fusion with lateral mass fixation   UMBILICAL HERNIA REPAIR     WISDOM TOOTH EXTRACTION      Current Outpatient Medications  Medication Sig Dispense Refill   citalopram (CELEXA) 20 MG tablet Take 1 tablet (20 mg total) by mouth daily. 90 tablet 1   dicyclomine (BENTYL) 20 MG tablet Take 1 tablet (20 mg total) by mouth 3 (three) times daily before meals for 20 days. 60 tablet 0   Multiple Vitamin (MULTIVITAMIN WITH MINERALS) TABS tablet Take 1 tablet by mouth daily.     omeprazole (PRILOSEC) 40 MG capsule TAKE 1 CAPSULE TWICE DAILY 180 capsule 0   simvastatin (ZOCOR) 40 MG tablet Take 1 tablet (40 mg total) by mouth daily. 90 tablet 1   tamsulosin (FLOMAX) 0.4 MG CAPS capsule TAKE 1 CAPSULE  BY MOUTH BEFORE BED. 90 capsule 2   No current  facility-administered medications for this visit.    Allergies as of 06/01/2023 - Review Complete 03/18/2023  Allergen Reaction Noted   Tramadol Rash and Other (See Comments) 07/10/2013    Family History  Problem Relation Age of Onset   Throat cancer Father    Heart disease Sister     Review of Systems:    Constitutional: No weight loss, fever, chills, weakness or fatigue HEENT: Eyes: No change in vision               Ears, Nose, Throat:  No change in hearing or congestion Skin: No rash or itching Cardiovascular: No chest  pain, chest pressure or palpitations   Respiratory: No SOB or cough Gastrointestinal: See HPI and otherwise negative Genitourinary: No dysuria or change in urinary frequency Neurological: No headache, dizziness or syncope Musculoskeletal: No new muscle or joint pain Hematologic: No bleeding or bruising Psychiatric: No history of depression or anxiety    Physical Exam:  Vital signs: There were no vitals taken for this visit.  Constitutional:   Pleasant Caucasian male*** appears to be in NAD, Well developed, Well nourished, alert and cooperative Head:  Normocephalic and atraumatic. Eyes:   PEERL, EOMI. No icterus. Conjunctiva pink. Ears:  Normal auditory acuity. Neck:  Supple Throat: Oral cavity and pharynx without inflammation, swelling or lesion.  Respiratory: Respirations even and unlabored. Lungs clear to auscultation bilaterally.   No wheezes, crackles, or rhonchi.  Cardiovascular: Normal S1, S2. Regular rate and rhythm. No peripheral edema, cyanosis or pallor.  Gastrointestinal:  Soft, nondistended, nontender. No rebound or guarding. Normal bowel sounds. No appreciable masses or hepatomegaly. Rectal:  Not performed.  Anoscopy: Msk:  Symmetrical without gross deformities. Without edema, no deformity or joint abnormality.  Neurologic:  Alert and  oriented x4;  grossly normal neurologically.  Skin:   Dry and intact without significant lesions or rashes. Psychiatric: Oriented to person, place and time. Demonstrates good judgement and reason without abnormal affect or behaviors.  RELEVANT LABS AND IMAGING: CBC    Latest Ref Rng & Units 01/21/2023    9:11 AM 03/05/2021   11:31 AM 02/19/2016    1:14 PM  CBC  WBC 3.4 - 10.8 x10E3/uL 4.8  6.2  4.7   Hemoglobin 13.0 - 17.7 g/dL 16.1  09.6  04.5   Hematocrit 37.5 - 51.0 % 47.6  44.4  45.0   Platelets 150 - 450 x10E3/uL 156  162  148      CMP     Latest Ref Rng & Units 01/21/2023    9:11 AM 03/05/2021   11:30 AM 02/19/2016     1:14 PM  CMP  Glucose 70 - 99 mg/dL 77  409  97   BUN 8 - 27 mg/dL 9  6  9    Creatinine 0.76 - 1.27 mg/dL 8.11  9.14  7.82   Sodium 134 - 144 mmol/L 138  138  137   Potassium 3.5 - 5.2 mmol/L 4.2  3.4  4.0   Chloride 96 - 106 mmol/L 99  104  100   CO2 20 - 29 mmol/L 23  24  29    Calcium 8.6 - 10.2 mg/dL 9.4  9.6  9.4   Total Protein 6.0 - 8.5 g/dL 6.9  6.8  6.9   Total Bilirubin 0.0 - 1.2 mg/dL 0.4  0.5  0.7   Alkaline Phos 44 - 121 IU/L 78  67  65   AST 0 - 40 IU/L 21  25  19  ALT 0 - 44 IU/L 23  24  16      10/31/2012 colonoscopy-  03/05/21 CT ABD/Pelvis IMPRESSION: 1. No acute findings are noted in the abdomen or pelvis to account for the patient's symptoms. 2. Colonic diverticulosis without evidence of acute diverticulitis at this time. 3. Aortic atherosclerosis. 4. Additional postoperative changes and incidental findings, as above.   Assessment: Normal LFTs  Plan: 1. ***   Thank you for the courtesy of this consult. Please call me with any questions or concerns.   Mercedez Boule, FNP-C [facility] 05/31/2023, 1:43 PM  Cc: Windell Moment, MD

## 2023-06-01 ENCOUNTER — Encounter: Payer: Self-pay | Admitting: Gastroenterology

## 2023-06-01 ENCOUNTER — Ambulatory Visit: Payer: Medicare HMO | Admitting: Gastroenterology

## 2023-06-01 ENCOUNTER — Other Ambulatory Visit: Payer: Self-pay | Admitting: Gastroenterology

## 2023-06-01 VITALS — BP 130/74 | HR 78 | Ht 70.0 in | Wt 162.4 lb

## 2023-06-01 DIAGNOSIS — R159 Full incontinence of feces: Secondary | ICD-10-CM

## 2023-06-01 DIAGNOSIS — K219 Gastro-esophageal reflux disease without esophagitis: Secondary | ICD-10-CM

## 2023-06-01 DIAGNOSIS — K648 Other hemorrhoids: Secondary | ICD-10-CM | POA: Diagnosis not present

## 2023-06-01 DIAGNOSIS — R1013 Epigastric pain: Secondary | ICD-10-CM

## 2023-06-01 DIAGNOSIS — K6289 Other specified diseases of anus and rectum: Secondary | ICD-10-CM

## 2023-06-01 DIAGNOSIS — R151 Fecal smearing: Secondary | ICD-10-CM

## 2023-06-01 DIAGNOSIS — R1319 Other dysphagia: Secondary | ICD-10-CM

## 2023-06-01 DIAGNOSIS — R1084 Generalized abdominal pain: Secondary | ICD-10-CM | POA: Diagnosis not present

## 2023-06-01 DIAGNOSIS — R1310 Dysphagia, unspecified: Secondary | ICD-10-CM

## 2023-06-01 DIAGNOSIS — R194 Change in bowel habit: Secondary | ICD-10-CM

## 2023-06-01 DIAGNOSIS — G8929 Other chronic pain: Secondary | ICD-10-CM

## 2023-06-01 DIAGNOSIS — L290 Pruritus ani: Secondary | ICD-10-CM

## 2023-06-01 DIAGNOSIS — R110 Nausea: Secondary | ICD-10-CM

## 2023-06-01 MED ORDER — HYDROCORTISONE ACETATE 25 MG RE SUPP: 25.0000 mg | Freq: Every evening | RECTAL | 0 refills | Status: DC

## 2023-06-01 MED ORDER — HYDROCORTISONE (PERIANAL) 2.5 % EX CREA: 1.0000 | TOPICAL_CREAM | Freq: Two times a day (BID) | CUTANEOUS | 0 refills | Status: AC

## 2023-06-01 NOTE — Progress Notes (Signed)
 Patient would like to switch from suppository to topical

## 2023-06-01 NOTE — Patient Instructions (Signed)
 You have been scheduled for an endoscopy. Please follow written instructions given to you at your visit today.  If you use inhalers (even only as needed), please bring them with you on the day of your procedure.  _____________________________________________  We have sent over your referral to [facility]. You may receive a call within the next few weeks regarding an appointment.  You will be contacted by [facility] in the next 2 days to arrange a barium swallow and ultrasound.  The number on your caller ID will be [phone], please answer when they call.  If you have not heard from them in 2 days please call [phone] to schedule.   __________________________________________________________________ A barium swallow is an examination that concentrates on views of the esophagus. This tends to be a double contrast exam (barium and two liquids which, when combined, create a gas to distend the wall of the oesophagus) or single contrast (non-ionic iodine based). The study is usually tailored to your symptoms so a good history is essential. Attention is paid during the study to the form, structure and configuration of the esophagus, looking for functional disorders (such as aspiration, dysphagia, achalasia, motility and reflux) EXAMINATION You may be asked to change into a gown, depending on the type of swallow being performed. A radiologist and radiographer will perform the procedure. The radiologist will advise you of the type of contrast selected for your procedure and direct you during the exam. You will be asked to stand, sit or lie in several different positions and to hold a small amount of fluid in your mouth before being asked to swallow while the imaging is performed .In some instances you may be asked to swallow barium coated marshmallows to assess the motility of a solid food bolus. The exam can be recorded as a digital or video fluoroscopy procedure. POST  PROCEDURE It will take 1-2 days for the barium to pass through your system. To facilitate this, it is important, unless otherwise directed, to increase your fluids for the next 24-48hrs and to resume your normal diet.  This test typically takes about 30 minutes to perform. __________________________________________________________________________________  Due to recent changes in healthcare laws, you may see the results of your imaging and laboratory studies on MyChart before your provider has had a chance to review them.  We understand that in some cases there may be results that are confusing or concerning to you. Not all laboratory results come back in the same time frame and the provider may be waiting for multiple results in order to interpret others.  Please give [place] 48 hours in order for your provider to thoroughly review all the results before contacting the office for clarification of your results.   It was a pleasure to see you today!  Thank you for trusting me with your gastrointestinal care!

## 2023-06-03 ENCOUNTER — Telehealth: Payer: Self-pay | Admitting: Gastroenterology

## 2023-06-03 ENCOUNTER — Ambulatory Visit: Payer: Medicare HMO

## 2023-06-03 ENCOUNTER — Other Ambulatory Visit: Payer: Medicare HMO

## 2023-06-03 NOTE — Telephone Encounter (Signed)
 Inbound call from [place] with [facility] in regards to patient referral requesting a call back at [phone].   States provider would like for patient to try pelvic floor therapy before surgery is considered.   Please advise.   Thank you

## 2023-06-03 NOTE — Telephone Encounter (Signed)
 Referral for pelvic floor therapy was placed.

## 2023-06-03 NOTE — Addendum Note (Signed)
 Addended by: Rise Paganini on: 06/03/2023 03:46 PM   Modules accepted: Orders

## 2023-06-03 NOTE — Telephone Encounter (Signed)
 Spoke with patient about recommendations from [facility] on trying pelvic floor therapy prior to surgical options. Pt agrees to try first and would like to be sent to location near his house. Refrral will be placed. [facility] [address] [place], [place] [address]

## 2023-06-04 NOTE — Telephone Encounter (Signed)
 Deanna, please advise if it's ok to proceed.

## 2023-06-04 NOTE — Telephone Encounter (Signed)
 Inbound call from [facility] stating referral will need to sent else where due to them not being able to provide patient with therapy needed. Advised [facility] in [place] is able to provide therapy needed. Please advise, thank you.

## 2023-06-09 ENCOUNTER — Ambulatory Visit (HOSPITAL_COMMUNITY)
Admission: RE | Admit: 2023-06-09 | Discharge: 2023-06-09 | Disposition: A | Discharge status: Home / Self Care | Payer: Medicare HMO | Source: Ambulatory Visit | Attending: Gastroenterology | Admitting: Gastroenterology

## 2023-06-09 ENCOUNTER — Ambulatory Visit (HOSPITAL_COMMUNITY)
Admission: RE | Admit: 2023-06-09 | Discharge: 2023-06-09 | Disposition: A | Discharge status: Home / Self Care | Payer: Medicare HMO | Source: Ambulatory Visit | Attending: Gastroenterology

## 2023-06-09 DIAGNOSIS — R1319 Other dysphagia: Secondary | ICD-10-CM | POA: Diagnosis present

## 2023-06-09 DIAGNOSIS — K219 Gastro-esophageal reflux disease without esophagitis: Secondary | ICD-10-CM | POA: Insufficient documentation

## 2023-06-09 DIAGNOSIS — R110 Nausea: Secondary | ICD-10-CM | POA: Insufficient documentation

## 2023-06-09 DIAGNOSIS — R1084 Generalized abdominal pain: Secondary | ICD-10-CM | POA: Insufficient documentation

## 2023-06-15 ENCOUNTER — Telehealth: Payer: Self-pay | Admitting: Gastroenterology

## 2023-06-15 NOTE — Telephone Encounter (Signed)
 Inbound call from patient's wife requesting a call to discuss recent imaging results. Advised that results have not yet been reviewed. Stated patient is still in a significant amount of pain. Requesting a call back. Please advise, thank you.

## 2023-06-15 NOTE — Telephone Encounter (Signed)
Left message for pt wife to call back

## 2023-06-16 NOTE — Telephone Encounter (Signed)
Left message for pt wife to call back

## 2023-06-17 ENCOUNTER — Other Ambulatory Visit: Payer: Self-pay

## 2023-06-17 DIAGNOSIS — K2100 Gastro-esophageal reflux disease with esophagitis, without bleeding: Secondary | ICD-10-CM

## 2023-06-17 NOTE — Telephone Encounter (Signed)
 Left message for pt to call back

## 2023-06-18 NOTE — Telephone Encounter (Signed)
 Pt wife Stanton Kidney  stated that the pt is still having abdominal pain.  Requesting the results of the recent US and ESOPHAGUS/BARIUM SWALLOW/TABLET STUDY. Stanton Kidney was made aware that we are still awaiting on the results for the [place].  Please review recent swallowing study and advise.

## 2023-06-25 ENCOUNTER — Telehealth: Payer: Self-pay

## 2023-06-25 NOTE — Telephone Encounter (Signed)
 Pt wife Stanton Kidney called requesting recent results for the [place]. Pt chart was reviewed and Stanton Kidney was  made aware of the results. Stanton Kidney stated that the pt was still having abdominal pain. Pt chart was reviewed and noted that pt has an EGD/ Colonoscopy scheduled. Stanton Kidney stated that she was under the understanding that the pt was only to have an EGD. Please review and advise if pt was to have both and please notify pt wife.

## 2023-06-28 NOTE — Telephone Encounter (Signed)
 Attempted to return call and left a voicemail to give me a call back. ECL was changed to just an endoscopy.

## 2023-06-28 NOTE — Telephone Encounter (Signed)
**Note De-identified  [place] Obfuscation** Please advise 

## 2023-06-28 NOTE — Telephone Encounter (Signed)
 Inbound call from patient's wife requesting to speak further regarding previous notes. Please advise, thank you.

## 2023-06-28 NOTE — Telephone Encounter (Signed)
 Patient returned call and patient's wife verbalized understanding that Mr. Clarence Davis is only getting an endoscopy.

## 2023-07-01 ENCOUNTER — Encounter: Payer: Self-pay | Admitting: Pediatrics

## 2023-07-08 NOTE — Progress Notes (Unsigned)
 [facility] History and Physical   Primary Care Physician:  Windell Moment, MD   Reason for Procedure:  GERD, dysphagia, nausea, abdominal pain  Plan:    Upper endoscopy with possible dilation   HPI: Clarence Davis is a 63 y.o. male undergoing upper endoscopy for evaluation of GERD, dysphagia, nausea and abdominal pain.  Currently on omeprazole 40 mg orally daily for abdominal pain.  Reports having dysphagia in the past and possibly had a dilation-records not available.  Barium esophagram 3-5 25 showed nonspecific esophageal dysmotility, tablet transiently suspended at the distal esophagus and a nonfixed narrowing in the distal third of the esophagus.  Right upper quadrant ultrasound was unremarkable.    Past Medical History:  Diagnosis Date   Alcoholic (HCC)    quit drinking many yrs ago per pt   Anxiety    Arthritis    "hands" (04/05/2015)   Bilateral carpal tunnel syndrome    Chronic back pain    "all over"   Chronic diarrhea    Depression    takes Citalopram daily   Dry skin    Dysrhythmia    PVC's    GERD (gastroesophageal reflux disease)    takes Omeprazole daily   Head injury    early 90's   Headache    "weekly" (04/05/2015)   Hepatitis C    "treated w/RX; don't have it anymore" (04/05/2015)    Past Surgical History:  Procedure Laterality Date   ANTERIOR CERVICAL DECOMP/DISCECTOMY FUSION N/A 12/15/2013   Procedure: CERVICAL FOUR TO FIVE, CERVICAL FIVE TO SIX, CERVICAL SIX TO SEVEN CERVICAL DECOMPRESSION/DISCECTOMY FUSION 3 LEVELS;  Surgeon: Karn Cassis, MD;  Location: [facility];  Service: Neurosurgery;  Laterality: N/A;  C4-5 C5-6 C6-7 Anterior cervical decompression/diskectomy/fusion   BACK SURGERY     CLOSED REDUCTION SHOULDER DISLOCATION Left 1975   patient denies   ESOPHAGOGASTRODUODENOSCOPY     HERNIA REPAIR     INGUINAL HERNIA REPAIR Left X 3   "   POSTERIOR CERVICAL FUSION/FORAMINOTOMY  04/05/2015   POSTERIOR CERVICAL  FUSION/FORAMINOTOMY N/A 04/05/2015   Procedure: Cervical three-four, Cervical four-five, Cervical five-six, Cervical six-seven Posterior cervical fusion with lateral mass fixation;  Surgeon: Hilda Lias, MD;  Location: [facility];  Service: Neurosurgery;  Laterality: N/A;  C3-4 C4-5 C5-6 C6-7 Posterior cervical fusion with lateral mass fixation   UMBILICAL HERNIA REPAIR     WISDOM TOOTH EXTRACTION      Prior to Admission medications   Medication Sig Start Date End Date Taking? Authorizing Provider  citalopram (CELEXA) 20 MG tablet TAKE 1 TABLET EVERY DAY 06/17/23   [name], Kristie Cowman, MD  dicyclomine (BENTYL) 20 MG tablet Take 1 tablet (20 mg total) by mouth 3 (three) times daily before meals for 20 days. 03/18/23 06/01/23  Windell Moment, MD  Multiple Vitamin (MULTIVITAMIN WITH MINERALS) TABS tablet Take 1 tablet by mouth daily.    [provider]  omeprazole (PRILOSEC) 40 MG capsule TAKE 1 CAPSULE TWICE DAILY 06/17/23   Windell Moment, MD  simvastatin (ZOCOR) 40 MG tablet TAKE 1 TABLET EVERY DAY 06/17/23   [name], Kristie Cowman, MD  tamsulosin (FLOMAX) 0.4 MG CAPS capsule TAKE 1 CAPSULE  BY MOUTH BEFORE BED. Patient not taking: Reported on 06/01/2023 03/25/21   Abigail Miyamoto, MD    Current Outpatient Medications  Medication Sig Dispense Refill   citalopram (CELEXA) 20 MG tablet TAKE 1 TABLET EVERY DAY 90 tablet 3   dicyclomine (BENTYL) 20 MG tablet Take 1 tablet (20 mg total)  by mouth 3 (three) times daily before meals for 20 days. 60 tablet 0   Multiple Vitamin (MULTIVITAMIN WITH MINERALS) TABS tablet Take 1 tablet by mouth daily.     omeprazole (PRILOSEC) 40 MG capsule TAKE 1 CAPSULE TWICE DAILY 180 capsule 3   simvastatin (ZOCOR) 40 MG tablet TAKE 1 TABLET EVERY DAY 90 tablet 3   tamsulosin (FLOMAX) 0.4 MG CAPS capsule TAKE 1 CAPSULE  BY MOUTH BEFORE BED. (Patient not taking: Reported on 06/01/2023) 90 capsule 2   No current facility-administered medications for this  visit.    Allergies as of 07/09/2023 - Review Complete 06/01/2023  Allergen Reaction Noted   Tramadol Rash and Other (See Comments) 07/10/2013    Family History  Problem Relation Age of Onset   Throat cancer Father    Heart disease Sister    Colon cancer Neg Hx    Stomach cancer Neg Hx    Esophageal cancer Neg Hx     Social History   Socioeconomic History   Marital status: Married    Spouse name: Not on file   Number of children: 2   Years of education: Not on file   Highest education level: 12th grade  Occupational History   Occupation: disabled  Tobacco Use   Smoking status: Some Days    Current packs/day: 0.00    Average packs/day: 0.2 packs/day for 28.0 years (4.2 ttl pk-yrs)    Types: E-cigarettes, Cigarettes    Start date: 02/25/1987    Last attempt to quit: 02/25/2015    Years since quitting: 8.3   Smokeless tobacco: Never  Vaping Use   Vaping status: Not on file  Substance and Sexual Activity   Alcohol use: Never    Comment: 04/05/2015 "recovering alcoholic; last drink 01/06/2011"   Drug use: Yes    Types: Marijuana   Sexual activity: Not Currently  Other Topics Concern   Not on file  Social History Narrative   Patient is left-handed. He lives with his long time girlfriend Arlington Calix in a one level home home. He drinks 3 cups of coffee and 4 Coca-Colas a day. He tries to walk when able.   Social Drivers of Corporate investment banker Strain: Low Risk  (02/18/2023)   Overall Financial Resource Strain (CARDIA)    Difficulty of Paying Living Expenses: Not hard at all  Food Insecurity: No Food Insecurity (02/18/2023)   Hunger Vital Sign    Worried About Running Out of Food in the Last Year: Never true    Ran Out of Food in the Last Year: Never true  Transportation Needs: No Transportation Needs (02/18/2023)   PRAPARE - Administrator, Civil Service (Medical): No    Lack of Transportation (Non-Medical): No  Physical Activity: Inactive  (02/18/2023)   Exercise Vital Sign    Days of Exercise per Week: 0 days    Minutes of Exercise per Session: 0 min  Stress: No Stress Concern Present (02/18/2023)   [employer] of Occupational Health - Occupational Stress Questionnaire    Feeling of Stress : Not at all  Social Connections: Moderately Integrated (02/18/2023)   Social Connection and Isolation Panel [NHANES]    Frequency of Communication with Friends and Family: More than three times a week    Frequency of Social Gatherings with Friends and Family: Three times a week    Attends Religious Services: Never    Active Member of Clubs or Organizations: Yes    Attends Banker  Meetings: More than 4 times per year    Marital Status: Living with partner  Intimate Partner Violence: Not At Risk (02/18/2023)   Humiliation, Afraid, Rape, and Kick questionnaire    Fear of Current or Ex-Partner: No    Emotionally Abused: No    Physically Abused: No    Sexually Abused: No    Review of Systems:  All other review of systems negative except as mentioned in the HPI.  Physical Exam: Vital signs There were no vitals taken for this visit.  General:   Alert,  Well-developed, well-nourished, pleasant and cooperative in NAD Airway:  Mallampati  Lungs:  Clear throughout to auscultation.   Heart:  Regular rate and rhythm; no murmurs, clicks, rubs,  or gallops. Abdomen:  Soft, nontender and nondistended. Normal bowel sounds.   Neuro/Psych:  Normal mood and affect. A and O x 3  Maren Beach, MD [facility]

## 2023-07-09 ENCOUNTER — Encounter: Payer: Self-pay | Admitting: Pediatrics

## 2023-07-09 ENCOUNTER — Ambulatory Visit: Payer: Medicare HMO | Admitting: Pediatrics

## 2023-07-09 VITALS — BP 92/56 | HR 67 | Temp 98.8°F | Resp 19 | Ht 70.0 in | Wt 162.0 lb

## 2023-07-09 DIAGNOSIS — K219 Gastro-esophageal reflux disease without esophagitis: Secondary | ICD-10-CM | POA: Diagnosis not present

## 2023-07-09 DIAGNOSIS — K222 Esophageal obstruction: Secondary | ICD-10-CM | POA: Diagnosis not present

## 2023-07-09 DIAGNOSIS — G8929 Other chronic pain: Secondary | ICD-10-CM

## 2023-07-09 DIAGNOSIS — K449 Diaphragmatic hernia without obstruction or gangrene: Secondary | ICD-10-CM | POA: Diagnosis not present

## 2023-07-09 DIAGNOSIS — R1310 Dysphagia, unspecified: Secondary | ICD-10-CM

## 2023-07-09 DIAGNOSIS — R112 Nausea with vomiting, unspecified: Secondary | ICD-10-CM

## 2023-07-09 NOTE — Progress Notes (Signed)
 Called to room to assist during endoscopic procedure.  Patient ID and intended procedure confirmed with present staff. Received instructions for my participation in the procedure from the performing physician.

## 2023-07-09 NOTE — Progress Notes (Signed)
 Report to PACU, RN, vss, BBS= Clear.

## 2023-07-09 NOTE — Progress Notes (Signed)
 Pt's states no medical or surgical changes since previsit or office visit.

## 2023-07-09 NOTE — Patient Instructions (Signed)
 Educational handout provided to patient related to post dilation diet  Resume previous diet  Continue present medications  Awaiting pathology results  YOU HAD AN ENDOSCOPIC PROCEDURE TODAY AT THE [facility]:   Refer to the procedure report that was given to you for any specific questions about what was found during the examination.  If the procedure report does not answer your questions, please call your gastroenterologist to clarify.  If you requested that your care partner not be given the details of your procedure findings, then the procedure report has been included in a sealed envelope for you to review at your convenience later.  YOU SHOULD EXPECT: Some feelings of bloating in the abdomen. Passage of more gas than usual.  Walking can help get rid of the air that was put into your GI tract during the procedure and reduce the bloating. If you had a lower endoscopy (such as a colonoscopy or flexible sigmoidoscopy) you may notice spotting of blood in your stool or on the toilet paper. If you underwent a bowel prep for your procedure, you may not have a normal bowel movement for a few days.  Please Note:  You might notice some irritation and congestion in your nose or some drainage.  This is from the oxygen used during your procedure.  There is no need for concern and it should clear up in a day or so.  SYMPTOMS TO REPORT IMMEDIATELY:  Following upper endoscopy (EGD)  Vomiting of blood or coffee ground material  New chest pain or pain under the shoulder blades  Painful or persistently difficult swallowing  New shortness of breath  Fever of 100F or higher  Black, tarry-looking stools  For urgent or emergent issues, a gastroenterologist can be reached at any hour by calling [phone]. Do not use MyChart messaging for urgent concerns.    DIET:  We do recommend a small meal at first, but then you may proceed to your regular diet.  Drink plenty of fluids but you should  avoid alcoholic beverages for 24 hours.  ACTIVITY:  You should plan to take it easy for the rest of today and you should NOT DRIVE or use heavy machinery until tomorrow (because of the sedation medicines used during the test).    FOLLOW UP: Our staff will call the number listed on your records the next business day following your procedure.  We will call around 7:15- 8:00 am to check on you and address any questions or concerns that you may have regarding the information given to you following your procedure. If we do not reach you, we will leave a message.     If any biopsies were taken you will be contacted by phone or by letter within the next 1-3 weeks.  Please call us at [phone] if you have not heard about the biopsies in 3 weeks.    SIGNATURES/CONFIDENTIALITY: You and/or your care partner have signed paperwork which will be entered into your electronic medical record.  These signatures attest to the fact that that the information above on your After Visit Summary has been reviewed and is understood.  Full responsibility of the confidentiality of this discharge information lies with you and/or your care-partner.

## 2023-07-09 NOTE — Op Note (Signed)
 [facility] Patient Name: Clarence Davis Procedure Date: 07/09/2023 2:48 PM MRN: [id number] Endoscopist: Maren Beach , MD, [id number] Age: 63 Referring MD:  Date of Birth: 1960-05-02 Gender: Male Account #: [id number] Procedure:                Upper GI endoscopy Indications:              Abdominal pain, Dysphagia, Follow-up of                            gastro-esophageal reflux disease, Nausea Medicines:                Monitored Anesthesia Care Procedure:                Pre-Anesthesia Assessment:                           - Prior to the procedure, a History and Physical                            was performed, and patient medications and                            allergies were reviewed. The patient's tolerance of                            previous anesthesia was also reviewed. The risks                            and benefits of the procedure and the sedation                            options and risks were discussed with the patient.                            All questions were answered, and informed consent                            was obtained. Prior Anticoagulants: The patient has                            taken no anticoagulant or antiplatelet agents. ASA                            Grade Assessment: II - A patient with mild systemic                            disease. After reviewing the risks and benefits,                            the patient was deemed in satisfactory condition to                            undergo the procedure.  After obtaining informed consent, the endoscope was                            passed under direct vision. Throughout the                            procedure, the patient's blood pressure, pulse, and                            oxygen saturations were monitored continuously. The                            Olympus Scope [id number] was introduced through the                            mouth, and advanced to  the second part of duodenum.                            The upper GI endoscopy was accomplished without                            difficulty. The patient tolerated the procedure                            well. Scope In: Scope Out: Findings:                 Normal mucosa was found in the entire esophagus.                            Biopsies were obtained from the proximal and distal                            esophagus with cold forceps for histology of                            suspected eosinophilic esophagitis.                           One benign-appearing, intrinsic moderate stenosis                            was found at the GE junction. The stenosis was                            traversed with the endoscope. The narrowing                            appeared to be related to the configuration of the                            esophagus and possibly a sequelae of hiatal hernia.                            A  guidewire was placed and the scope was withdrawn.                            Dilation was performed with a Savary dilator with                            no resistance at 14 mm, 15 mm and 16 mm.                           The gastric body, gastric antrum, cardia (on                            retroflexion) and gastric fundus (on retroflexion)                            were normal. Biopsies were taken with a cold                            forceps for Helicobacter pylori testing.                           A small hiatal hernia was present.                           The duodenal bulb and second portion of the                            duodenum were normal. Biopsies for histology were                            taken with a cold forceps for evaluation of celiac                            disease. Complications:            No immediate complications. Estimated blood loss:                            Minimal. Estimated Blood Loss:     Estimated blood loss was minimal. Impression:                - Normal mucosa was found in the entire esophagus.                           - Benign-appearing esophageal stenosis possibly                            related to hiatal hernia. Dilated.                           - Normal gastric body, antrum, cardia and gastric                            fundus. Biopsied.                           -  Small hiatal hernia.                           - Normal duodenal bulb and second portion of the                            duodenum. Biopsied.                           - Biopsies were taken with a cold forceps for                            evaluation of eosinophilic esophagitis. Recommendation:           - Discharge patient to home (ambulatory).                           - Await pathology results.                           - The findings and recommendations were discussed                            with the designated responsible adult.                           - Return to GI clinic in 2 months.                           - Patient has a contact number available for                            emergencies. The signs and symptoms of potential                            delayed complications were discussed with the                            patient. Return to normal activities tomorrow.                            Written discharge instructions were provided to the                            patient. Maren Beach, MD 07/09/2023 3:20:15 PM This report has been signed electronically.

## 2023-07-12 ENCOUNTER — Telehealth: Payer: Self-pay

## 2023-07-12 NOTE — Telephone Encounter (Signed)
  Follow up Call-     07/09/2023    2:02 PM  Call back number  Post procedure Call Back phone  # [phone] OK to speak to Stanton Kidney  Permission to leave phone message Yes     Patient questions:  Do you have a fever, pain , or abdominal swelling? No. Pain Score  0 *  Have you tolerated food without any problems? Yes.    Have you been able to return to your normal activities? Yes.    Do you have any questions about your discharge instructions: Diet   No. Medications  No. Follow up visit  No.  Do you have questions or concerns about your Care? No.  Actions: * If pain score is 4 or above: No action needed, pain <4.

## 2023-07-13 ENCOUNTER — Other Ambulatory Visit: Payer: Self-pay

## 2023-07-13 MED ORDER — TAMSULOSIN HCL 0.4 MG PO CAPS: 0.4000 mg | ORAL_CAPSULE | Freq: Every day | ORAL | 0 refills | Status: DC

## 2023-07-14 LAB — SURGICAL PATHOLOGY

## 2023-07-15 ENCOUNTER — Encounter: Payer: Self-pay | Admitting: Pediatrics

## 2023-07-15 ENCOUNTER — Telehealth: Payer: Self-pay | Admitting: Pediatrics

## 2023-07-15 NOTE — Telephone Encounter (Signed)
 I left a detailed message stating that Dr. Doy Hutching has not reviewed the results yet but once she does, we will give him call with that information.

## 2023-07-15 NOTE — Telephone Encounter (Signed)
Patient's spouse was advised.

## 2023-07-15 NOTE — Telephone Encounter (Signed)
 Patient spouse requesting f/u call to discuss results. Please advise.   Thank you

## 2023-07-20 ENCOUNTER — Telehealth: Payer: Self-pay | Admitting: Pediatrics

## 2023-07-20 NOTE — Telephone Encounter (Signed)
 Inbound call from patients significant other Abran Abrahams requesting a call to discuss results from 4/4. Please advise.

## 2023-07-20 NOTE — Telephone Encounter (Signed)
 Called and spoke with Debra regarding 07/09/23 pathology results. Abran Abrahams stated that patient's swallowing has improved but he continues to have intermittent abdominal pain. I confirmed that patient is taking Omeprazole 40 mg twice a day but Abran Abrahams states that patient has been taking Omeprazole after he eats. I advised that it is best if patient takes Omeprazole 30-60 minutes before breakfast and dinner. Patient will try this for a couple of weeks and if no improvement he will call back and let us  know.

## 2023-09-17 ENCOUNTER — Ambulatory Visit: Admitting: Pediatrics

## 2023-09-25 ENCOUNTER — Other Ambulatory Visit: Payer: Self-pay

## 2023-11-23 ENCOUNTER — Ambulatory Visit (INDEPENDENT_AMBULATORY_CARE_PROVIDER_SITE_OTHER)

## 2023-11-23 VITALS — BP 104/70 | HR 94 | Temp 98.3°F | Ht 70.0 in | Wt 157.0 lb

## 2023-11-23 DIAGNOSIS — Z125 Encounter for screening for malignant neoplasm of prostate: Secondary | ICD-10-CM

## 2023-11-23 DIAGNOSIS — R7303 Prediabetes: Secondary | ICD-10-CM

## 2023-11-23 DIAGNOSIS — L299 Pruritus, unspecified: Secondary | ICD-10-CM | POA: Insufficient documentation

## 2023-11-23 DIAGNOSIS — R079 Chest pain, unspecified: Secondary | ICD-10-CM

## 2023-11-23 DIAGNOSIS — F17200 Nicotine dependence, unspecified, uncomplicated: Secondary | ICD-10-CM

## 2023-11-23 DIAGNOSIS — R202 Paresthesia of skin: Secondary | ICD-10-CM

## 2023-11-23 DIAGNOSIS — E785 Hyperlipidemia, unspecified: Secondary | ICD-10-CM

## 2023-11-23 HISTORY — DX: Chest pain, unspecified: R07.9

## 2023-11-23 HISTORY — DX: Pruritus, unspecified: L29.9

## 2023-11-23 HISTORY — DX: Paresthesia of skin: R20.2

## 2023-11-23 MED ORDER — HYDROXYZINE PAMOATE 25 MG PO CAPS: 25.0000 mg | ORAL_CAPSULE | Freq: Three times a day (TID) | ORAL | 0 refills | Status: AC | PRN

## 2023-11-23 MED ORDER — VARENICLINE TARTRATE 1 MG PO TABS: 1.0000 mg | ORAL_TABLET | Freq: Two times a day (BID) | ORAL | 0 refills | Status: DC

## 2023-11-23 MED ORDER — VARENICLINE TARTRATE (STARTER) 0.5 MG X 11 & 1 MG X 42 PO TBPK: ORAL_TABLET | ORAL | 0 refills | Status: DC

## 2023-11-23 NOTE — Patient Instructions (Signed)
  VISIT SUMMARY: Today, we addressed your sensations of bugs crawling under your skin, chest pain, chronic cough, nicotine  dependence, gastrointestinal symptoms, and anxiety with tremor. We discussed various management plans and next steps for each issue.  YOUR PLAN: CHRONIC PRURITUS WITH EXCORIATIONS: You have chronic itching with a sensation of bugs crawling under your skin, especially at night. -Take the prescribed medication for itching at bedtime. If one pill is not effective after 2-3 days, increase to two pills at bedtime.  CHRONIC COUGH WITH SPUTUM PRODUCTION AND WHEEZING, SUSPECTED COPD: You have a chronic cough with sputum and wheezing, likely due to COPD from smoking. -A lung cancer screening CT scan has been ordered due to your smoking history.  NICOTINE  DEPENDENCE, CIGARETTES: You are dependent on nicotine  and smoke one pack per day. You are interested in quitting but have concerns about the cost and effectiveness of cessation aids. -We will check your insurance coverage for Chantix  to help you quit smoking.  CHRONIC CHEST PAIN: You have intermittent chest pain on the right side, sometimes associated with stress. No clear heart-related cause has been found. -Monitor your symptoms and try to manage stress. If the pain worsens or changes, seek medical attention.  CHRONIC GASTROINTESTINAL SYMPTOMS (ABDOMINAL PAIN, DYSPEPSIA): You have ongoing stomach pain and indigestion. Previous tests were normal, but symptoms persist. -Continue taking your current medications: Celexa , Bentyl , and Prilosec.  GENERALIZED ANXIETY AND TREMOR: You have anxiety and tremors, which may be worse when your blood sugar is low. Your symptoms have improved with dietary changes. -Continue with your current management and dietary modifications.                      Contains text generated by Abridge.                                 Contains text generated by  Abridge.

## 2023-11-23 NOTE — Assessment & Plan Note (Signed)
 Chronic pruritus  primarily on the back, with a sensation of bugs crawling under the skin. Symptoms are worse at night. No visible signs of infestation or bites. No evidence of hallucinations or substance use contributing to symptoms. - Prescribe hydroxyzine  25 mg medication for itching to be taken at bedtime. If one pill is ineffective in 2-3 days, increase to two at bedtime.

## 2023-11-23 NOTE — Assessment & Plan Note (Signed)
 Intermittent chest pain, primarily on the right side, occurring periodically and sometimes associated with stress. No clear cardiac etiology identified. Symptoms occur even at rest.  Does not appear cardiac. Advised to call 911 if any worse pain

## 2023-11-23 NOTE — Assessment & Plan Note (Signed)
 Nicotine  dependence with smoking one pack per day. Interested in quitting but concerned about cost and effectiveness of smoking cessation aids. - Check insurance coverage for Chantix  to assist with smoking cessation. But DID PRESCRIBE CHANTIX  starter and continuation packs. Advised that the medicine would help cut the cravings for cigarettes.

## 2023-11-23 NOTE — Assessment & Plan Note (Signed)
 On simvastatin  40 mg daily. Advised to repeat lipid panel today Will make med changes if needed based on the blood work results

## 2023-11-23 NOTE — Assessment & Plan Note (Signed)
 Chronic pruritus with excoriations, primarily on the back, with a sensation of bugs crawling under the skin. Symptoms are worse at night. No visible signs of infestation or bites. No evidence of hallucinations or substance use contributing to symptoms. - Prescribe medication HYDROXYZINE  25 MG for itching to be taken at bedtime. If one pill is ineffective in 2-3 days, increase to two at bedtime.

## 2023-11-23 NOTE — Progress Notes (Signed)
 Subjective:  Patient ID: Clarence Davis, male    DOB: [birth year]  Age: 63 y.o. MRN: [id number]  Chief Complaint  Patient presents with   Annual Exam    HPI: Discussed the use of AI scribe software for clinical note transcription with the patient, who gave verbal consent to proceed.  History of Present Illness   Clarence Davis is a 63 year old male who presents with sensations of bugs crawling under his skin and chest pain.  Formication and pruritus - Sensations of bugs crawling under the skin, primarily on the back - Symptoms [place] approximately 13 [place] ago [place] - Sensations described as 'coming up' and causing scars - Symptoms persist, particularly at night, impacting sleep - No visible bugs observed, but persistent sensation under the skin - Management includes use of alcohol and creams, including a cream initially prescribed at [facility]  Chest pain - Intermittent right-sided chest pain - Pain described as stabbing - Occurs periodically, often when sitting and watching TV - Pain may be associated with stress or emotional upset - Symptoms occur throughout the day  Abdominal pain and gastrointestinal symptoms - Chronic daily stomach pain - Pain often accompanied by burping - Management includes fiber intake and medications such as Bentyl  and Prilosec  Respiratory symptoms - Chronic cough and wheezing, particularly in the morning - History of smoking one pack of cigarettes per day  Psychological symptoms and tremor - History of 'bad nerves', attributed to family history - Experiences trembling, particularly when hypoglycemic - Takes Celexa  20 mg for anxiety  Unintentional weight loss - Significant weight loss from 250 pounds despite adequate food intake - Diet includes eggs, cheese, onions, cheeseburgers, and pork - History of inability to gain weight  Chronic pain - Chronic pain present, but avoids discussing details as it causes fatigue  for his wife  Infectious exposure - Wife recently had COVID-19, but he did not contract the infection despite close contact  Substance use history - Sober from alcohol for 15 years - Started and leads his own AA group         11/23/2023    9:50 AM 02/18/2023    3:15 PM 01/21/2023    8:34 AM  Depression screen PHQ 2/9  Decreased Interest 0 1 0  Down, Depressed, Hopeless 0 0 0  PHQ - 2 Score 0 1 0  Altered sleeping  2 3  Tired, decreased energy  0 3  Change in appetite  1 3  Feeling bad or failure about yourself   0 0  Trouble concentrating  0 0  Moving slowly or fidgety/restless  0 0  Suicidal thoughts  0 0  PHQ-9 Score  4 9  Difficult doing work/chores  Not difficult at all Very difficult        11/23/2023    9:49 AM  Fall Risk   Falls in the past year? 0  Number falls in past yr: 0  Injury with Fall? 0  Risk for fall due to : No Fall Risks  Follow up Falls evaluation completed    Patient Care Team: Riannon Mukherjee, MD as PCP - General (Family Medicine) Skeet Juliene SAUNDERS, DO as Consulting Physician (Neurology)   Review of Systems  Constitutional:  Negative for chills, fatigue and fever.  HENT:  Negative for congestion, ear pain, sinus pressure and sore throat.   Respiratory:  Negative for cough and shortness of breath.   Cardiovascular:  Positive for chest pain.  Gastrointestinal:  Positive for abdominal pain. Negative for constipation, diarrhea, nausea and vomiting.  Genitourinary:  Negative for dysuria and frequency.  Musculoskeletal:  Negative for arthralgias, back pain and myalgias.  Skin:        Intense itching  Neurological:  Negative for dizziness and headaches.  Psychiatric/Behavioral:  Negative for dysphoric mood. The patient is not nervous/anxious.     Current Outpatient Medications on File Prior to Visit  Medication Sig Dispense Refill   citalopram  (CELEXA ) 20 MG tablet TAKE 1 TABLET EVERY DAY 90 tablet 3   dicyclomine  (BENTYL ) 20 MG tablet Take 1  tablet (20 mg total) by mouth 3 (three) times daily before meals for 20 days. 60 tablet 0   Multiple Vitamin (MULTIVITAMIN WITH MINERALS) TABS tablet Take 1 tablet by mouth daily.     omeprazole  (PRILOSEC) 40 MG capsule TAKE 1 CAPSULE TWICE DAILY 180 capsule 3   simvastatin  (ZOCOR ) 40 MG tablet TAKE 1 TABLET EVERY DAY 90 tablet 3   tamsulosin  (FLOMAX ) 0.4 MG CAPS capsule TAKE 1 CAPSULE EVERY DAY 90 capsule 3   No current facility-administered medications on file prior to visit.   Past Medical History:  Diagnosis Date   Alcoholic (HCC)    quit drinking many yrs ago per pt   Anxiety    Arthritis    hands (04/05/2015)   Bilateral carpal tunnel syndrome    Chronic back pain    all over   Chronic diarrhea    Depression    takes Citalopram  daily   Dry skin    Dysrhythmia    PVC's    GERD (gastroesophageal reflux disease)    takes Omeprazole  daily   Head injury    early 90's   Headache    weekly (04/05/2015)   Hepatitis C    treated w/RX; don't have it anymore (04/05/2015)   Past Surgical History:  Procedure Laterality Date   ANTERIOR CERVICAL DECOMP/DISCECTOMY FUSION N/A 12/15/2013   Procedure: CERVICAL FOUR TO FIVE, CERVICAL FIVE TO SIX, CERVICAL SIX TO SEVEN CERVICAL DECOMPRESSION/DISCECTOMY FUSION 3 LEVELS;  Surgeon: Catalina CHRISTELLA Stains, MD;  Location: [facility];  Service: Neurosurgery;  Laterality: N/A;  C4-5 C5-6 C6-7 Anterior cervical decompression/diskectomy/fusion   BACK SURGERY     CLOSED REDUCTION SHOULDER DISLOCATION Left 1975   patient denies   ESOPHAGOGASTRODUODENOSCOPY     HERNIA REPAIR     INGUINAL HERNIA REPAIR Left X 3      POSTERIOR CERVICAL FUSION/FORAMINOTOMY  04/05/2015   POSTERIOR CERVICAL FUSION/FORAMINOTOMY N/A 04/05/2015   Procedure: Cervical three-four, Cervical four-five, Cervical five-six, Cervical six-seven Posterior cervical fusion with lateral mass fixation;  Surgeon: Catalina Stains, MD;  Location: [facility];  Service: Neurosurgery;   Laterality: N/A;  C3-4 C4-5 C5-6 C6-7 Posterior cervical fusion with lateral mass fixation   UMBILICAL HERNIA REPAIR     WISDOM TOOTH EXTRACTION      Family History  Problem Relation Age of Onset   Throat cancer Father    Heart disease Sister    Colon cancer Neg Hx    Stomach cancer Neg Hx    Esophageal cancer Neg Hx    Social History   Socioeconomic History   Marital status: Married    Spouse name: Not on file   Number of children: 2   Years of education: Not on file   Highest education level: 12th grade  Occupational History   Occupation: disabled  Tobacco Use   Smoking status: Some Days    Current packs/day: 0.00  Average packs/day: 0.2 packs/day for 28.0 years (4.2 ttl pk-yrs)    Types: Cigarettes    Start date: 02/25/1987    Last attempt to quit: 02/25/2015    Years since quitting: 8.7   Smokeless tobacco: Never  Vaping Use   Vaping status: Not on file  Substance and Sexual Activity   Alcohol use: Never    Comment: 04/05/2015 recovering alcoholic; last drink 01/06/2011   Drug use: Yes    Frequency: 2.0 times per week    Types: Marijuana    Comment: last 07/08/23   Sexual activity: Not Currently  Other Topics Concern   Not on file  Social History Narrative   Patient is left-handed. He lives with his long time girlfriend Adrien Dolores in a one level home home. He drinks 3 cups of coffee and 4 Coca-Colas a day. He tries to walk when able.   Social Drivers of Corporate investment banker Strain: Low Risk  (02/18/2023)   Overall Financial Resource Strain (CARDIA)    Difficulty of Paying Living Expenses: Not hard at all  Food Insecurity: No Food Insecurity (02/18/2023)   Hunger Vital Sign    Worried About Running Out of Food in the Last Year: Never true    Ran Out of Food in the Last Year: Never true  Transportation Needs: No Transportation Needs (02/18/2023)   PRAPARE - Administrator, Civil Service (Medical): No    Lack of Transportation  (Non-Medical): No  Physical Activity: Inactive (02/18/2023)   Exercise Vital Sign    Days of Exercise per Week: 0 days    Minutes of Exercise per Session: 0 min  Stress: No Stress Concern Present (02/18/2023)   [employer] of Occupational Health - Occupational Stress Questionnaire    Feeling of Stress : Not at all  Social Connections: Moderately Integrated (02/18/2023)   Social Connection and Isolation Panel    Frequency of Communication with Friends and Family: More than three times a week    Frequency of Social Gatherings with Friends and Family: Three times a week    Attends Religious Services: Never    Active Member of Clubs or Organizations: Yes    Attends Engineer, structural: More than 4 times per year    Marital Status: Living with partner    Objective:  BP 104/70   Pulse 94   Temp 98.3 F (36.8 C)   Ht 5' 10 (1.778 m)   Wt 157 lb (71.2 kg)   SpO2 97%   BMI 22.53 kg/m      11/23/2023    9:47 AM 07/09/2023    3:35 PM 07/09/2023    3:25 PM  BP/Weight  Systolic BP 104 92 83  Diastolic BP 70 56 51  Wt. (Lbs) 157    BMI 22.53 kg/m2      Physical Exam Vitals and nursing note reviewed.  Constitutional:      Appearance: Normal appearance.  HENT:     Head: Normocephalic and atraumatic.     Mouth/Throat:     Mouth: Mucous membranes are moist.  Cardiovascular:     Rate and Rhythm: Normal rate and regular rhythm.  Pulmonary:     Effort: Pulmonary effort is normal.     Breath sounds: Wheezing present.  Abdominal:     General: There is no distension.     Tenderness: There is abdominal tenderness (generalized).  Musculoskeletal:        General: Normal range of motion.  Cervical back: Normal range of motion.  Skin:    Comments: No excoriations noted at this time. Sun induced hyperpigmentation noted on the upper back No rash noted  Neurological:     General: No focal deficit present.     Mental Status: He is alert.  Psychiatric:        Mood and  Affect: Mood normal.         Lab Results  Component Value Date   WBC 4.8 01/21/2023   HGB 15.6 01/21/2023   HCT 47.6 01/21/2023   PLT 156 01/21/2023   GLUCOSE 77 01/21/2023   CHOL 152 01/21/2023   TRIG 44 01/21/2023   HDL 55 01/21/2023   LDLCALC 87 01/21/2023   ALT 23 01/21/2023   AST 21 01/21/2023   NA 138 01/21/2023   K 4.2 01/21/2023   CL 99 01/21/2023   CREATININE 0.97 01/21/2023   BUN 9 01/21/2023   CO2 23 01/21/2023   HGBA1C 5.7 (H) 01/21/2023      Assessment & Plan:  Right-sided chest pain Assessment & Plan: Intermittent chest pain, primarily on the right side, occurring periodically and sometimes associated with stress. No clear cardiac etiology identified. Symptoms occur even at rest.  Does not appear cardiac. Advised to call 911 if any worse pain   Sensation of skin crawling Assessment & Plan: Chronic pruritus with excoriations, primarily on the back, with a sensation of bugs crawling under the skin. Symptoms are worse at night. No visible signs of infestation or bites. No evidence of hallucinations or substance use contributing to symptoms. - Prescribe medication HYDROXYZINE  25 MG for itching to be taken at bedtime. If one pill is ineffective in 2-3 days, increase to two at bedtime.  Orders: -     CBC with Differential/Platelet  Tobacco use disorder Assessment & Plan: Nicotine  dependence with smoking one pack per day. Interested in quitting but concerned about cost and effectiveness of smoking cessation aids. - Check insurance coverage for Chantix  to assist with smoking cessation. But DID PRESCRIBE CHANTIX  starter and continuation packs. Advised that the medicine would help cut the cravings for cigarettes.    Orders: -     CT CHEST LUNG CANCER SCREENING LOW DOSE WO CONTRAST; Future  Chronic pruritus Assessment & Plan: Chronic pruritus  primarily on the back, with a sensation of bugs crawling under the skin. Symptoms are worse at night. No  visible signs of infestation or bites. No evidence of hallucinations or substance use contributing to symptoms. - Prescribe hydroxyzine  25 mg medication for itching to be taken at bedtime. If one pill is ineffective in 2-3 days, increase to two at bedtime.  Orders: -     CBC with Differential/Platelet -     Comprehensive metabolic panel with GFR  Borderline diabetes -     Comprehensive metabolic panel with GFR -     Hemoglobin A1c  Screening for prostate cancer -     PSA  Hyperlipidemia LDL goal <130 Assessment & Plan: On simvastatin  40 mg daily. Advised to repeat lipid panel today Will make med changes if needed based on the blood work results  Orders: -     Comprehensive metabolic panel with GFR -     Lipid panel  Other orders -     hydrOXYzine  Pamoate; Take 1 capsule (25 mg total) by mouth every 8 (eight) hours as needed for up to 20 days for itching.  Dispense: 60 capsule; Refill: 0 -     Varenicline  Tartrate (Starter);  Take 0.5 mg by mouth daily for 3 days, THEN 0.5 mg 2 (two) times daily for 4 days, THEN 1 mg 2 (two) times daily for 21 days.  Dispense: 1 each; Refill: 0 -     Varenicline  Tartrate; Take 1 tablet (1 mg total) by mouth 2 (two) times daily.  Dispense: 60 tablet; Refill: 0   Assessment and Plan            Meds ordered this encounter  Medications   hydrOXYzine  (VISTARIL ) 25 MG capsule    Sig: Take 1 capsule (25 mg total) by mouth every 8 (eight) hours as needed for up to 20 days for itching.    Dispense:  60 capsule    Refill:  0   Varenicline  Tartrate, Starter, (CHANTIX  STARTING MONTH PAK) 0.5 MG X 11 & 1 MG X 42 TBPK    Sig: Take 0.5 mg by mouth daily for 3 days, THEN 0.5 mg 2 (two) times daily for 4 days, THEN 1 mg 2 (two) times daily for 21 days.    Dispense:  1 each    Refill:  0   varenicline  (CHANTIX  CONTINUING MONTH PAK) 1 MG tablet    Sig: Take 1 tablet (1 mg total) by mouth 2 (two) times daily.    Dispense:  60 tablet    Refill:  0     Orders Placed This Encounter  Procedures   CT CHEST LUNG CA SCREEN LOW DOSE W/O CM   CBC with Differential   Comprehensive metabolic panel with GFR   Lipid Panel   PSA   Hemoglobin A1c     Follow-up: Return in about 8 weeks (around 01/18/2024) for chronic disease follow up.    An After Visit Summary was printed and given to the patient.  Jachin Coury, MD [facility] [phone]

## 2023-11-24 ENCOUNTER — Ambulatory Visit: Payer: Self-pay

## 2023-11-24 LAB — CBC WITH DIFFERENTIAL/PLATELET
Basophils Absolute: 0 x10E3/uL (ref 0.0–0.2)
Basos: 1 %
EOS (ABSOLUTE): 0.1 x10E3/uL (ref 0.0–0.4)
Eos: 1 %
Hematocrit: 40 % (ref 37.5–51.0)
Hemoglobin: 13.3 g/dL (ref 13.0–17.7)
Immature Grans (Abs): 0 x10E3/uL (ref 0.0–0.1)
Immature Granulocytes: 0 %
Lymphocytes Absolute: 1.3 x10E3/uL (ref 0.7–3.1)
Lymphs: 25 %
MCH: 34 pg — ABNORMAL HIGH (ref 26.6–33.0)
MCHC: 33.3 g/dL (ref 31.5–35.7)
MCV: 102 fL — ABNORMAL HIGH (ref 79–97)
Monocytes Absolute: 0.5 x10E3/uL (ref 0.1–0.9)
Monocytes: 10 %
Neutrophils Absolute: 3.3 x10E3/uL (ref 1.4–7.0)
Neutrophils: 63 %
Platelets: 154 x10E3/uL (ref 150–450)
RBC: 3.91 x10E6/uL — ABNORMAL LOW (ref 4.14–5.80)
RDW: 13.3 % (ref 11.6–15.4)
WBC: 5.3 x10E3/uL (ref 3.4–10.8)

## 2023-11-24 LAB — COMPREHENSIVE METABOLIC PANEL WITH GFR
ALT: 19 IU/L (ref 0–44)
AST: 28 IU/L (ref 0–40)
Albumin: 4.5 g/dL (ref 3.9–4.9)
Alkaline Phosphatase: 77 IU/L (ref 44–121)
BUN/Creatinine Ratio: 8 — ABNORMAL LOW (ref 10–24)
BUN: 9 mg/dL (ref 8–27)
Bilirubin Total: 0.4 mg/dL (ref 0.0–1.2)
CO2: 23 mmol/L (ref 20–29)
Calcium: 9.5 mg/dL (ref 8.6–10.2)
Chloride: 103 mmol/L (ref 96–106)
Creatinine, Ser: 1.07 mg/dL (ref 0.76–1.27)
Globulin, Total: 2.2 g/dL (ref 1.5–4.5)
Glucose: 65 mg/dL — ABNORMAL LOW (ref 70–99)
Potassium: 4.3 mmol/L (ref 3.5–5.2)
Sodium: 140 mmol/L (ref 134–144)
Total Protein: 6.7 g/dL (ref 6.0–8.5)
eGFR: 78 mL/min/1.73 (ref >59)

## 2023-11-24 LAB — LIPID PANEL
Chol/HDL Ratio: 2.6 ratio (ref 0.0–5.0)
Cholesterol, Total: 163 mg/dL (ref 100–199)
HDL: 63 mg/dL (ref >39)
LDL Chol Calc (NIH): 92 mg/dL (ref 0–99)
Triglycerides: 37 mg/dL (ref 0–149)
VLDL Cholesterol Cal: 8 mg/dL (ref 5–40)

## 2023-11-24 LAB — HEMOGLOBIN A1C
Est. average glucose Bld gHb Est-mCnc: 111 mg/dL
Hgb A1c MFr Bld: 5.5 % (ref 4.8–5.6)

## 2023-11-24 LAB — PSA: Prostate Specific Ag, Serum: 0.8 ng/mL (ref 0.0–4.0)

## 2023-11-29 ENCOUNTER — Ambulatory Visit (INDEPENDENT_AMBULATORY_CARE_PROVIDER_SITE_OTHER)
Admission: RE | Admit: 2023-11-29 | Discharge: 2023-11-29 | Disposition: A | Discharge status: Home / Self Care | Source: Ambulatory Visit

## 2023-11-29 DIAGNOSIS — F17210 Nicotine dependence, cigarettes, uncomplicated: Secondary | ICD-10-CM

## 2023-11-29 DIAGNOSIS — F17200 Nicotine dependence, unspecified, uncomplicated: Secondary | ICD-10-CM

## 2023-12-14 ENCOUNTER — Other Ambulatory Visit: Payer: Self-pay

## 2023-12-14 MED ORDER — ATORVASTATIN CALCIUM 40 MG PO TABS: 40.0000 mg | ORAL_TABLET | Freq: Every day | ORAL | 3 refills | Status: DC

## 2023-12-15 ENCOUNTER — Other Ambulatory Visit: Payer: Self-pay

## 2023-12-15 ENCOUNTER — Telehealth: Payer: Self-pay

## 2023-12-15 MED ORDER — ATORVASTATIN CALCIUM 40 MG PO TABS: 40.0000 mg | ORAL_TABLET | Freq: Every day | ORAL | 3 refills | Status: DC

## 2023-12-15 NOTE — Telephone Encounter (Signed)
 Returned call and spoke with spouse, explained CT results. Also suggested that patient could take ASA 81mg  (discussed this with Dr. Sherre who gave verbal ok to recommend this). Wife verbalized understanding.  Copied from CRM [id number]. Topic: Clinical - Lab/Test Results >> Dec 15, 2023 10:13 AM Pinkey ORN wrote: Reason for CRM: Lab Results >> Dec 15, 2023 10:14 AM Pinkey ORN wrote: Patient is requesting a call back in regards to lab results, states he needs a better understanding of what was told to him on yesterday. Please follow up at [phone]

## 2023-12-20 ENCOUNTER — Ambulatory Visit (INDEPENDENT_AMBULATORY_CARE_PROVIDER_SITE_OTHER)

## 2023-12-20 VITALS — BP 106/60 | HR 89 | Temp 97.6°F | Ht 70.0 in | Wt 159.0 lb

## 2023-12-20 DIAGNOSIS — Z125 Encounter for screening for malignant neoplasm of prostate: Secondary | ICD-10-CM

## 2023-12-20 DIAGNOSIS — F17200 Nicotine dependence, unspecified, uncomplicated: Secondary | ICD-10-CM

## 2023-12-20 DIAGNOSIS — R109 Unspecified abdominal pain: Secondary | ICD-10-CM | POA: Diagnosis not present

## 2023-12-20 DIAGNOSIS — G8929 Other chronic pain: Secondary | ICD-10-CM

## 2023-12-20 DIAGNOSIS — Z8619 Personal history of other infectious and parasitic diseases: Secondary | ICD-10-CM

## 2023-12-20 DIAGNOSIS — R7303 Prediabetes: Secondary | ICD-10-CM | POA: Insufficient documentation

## 2023-12-20 DIAGNOSIS — F17210 Nicotine dependence, cigarettes, uncomplicated: Secondary | ICD-10-CM

## 2023-12-20 DIAGNOSIS — E785 Hyperlipidemia, unspecified: Secondary | ICD-10-CM | POA: Diagnosis not present

## 2023-12-20 DIAGNOSIS — J4489 Other specified chronic obstructive pulmonary disease: Secondary | ICD-10-CM | POA: Diagnosis not present

## 2023-12-20 DIAGNOSIS — L299 Pruritus, unspecified: Secondary | ICD-10-CM

## 2023-12-20 DIAGNOSIS — R202 Paresthesia of skin: Secondary | ICD-10-CM

## 2023-12-20 HISTORY — DX: Personal history of other infectious and parasitic diseases: Z86.19

## 2023-12-20 HISTORY — DX: Prediabetes: R73.03

## 2023-12-20 MED ORDER — VARENICLINE TARTRATE 1 MG PO TABS
1.0000 mg | ORAL_TABLET | Freq: Two times a day (BID) | ORAL | 0 refills | Status: DC
Start: 2023-12-20 — End: 2024-01-18

## 2023-12-20 NOTE — Progress Notes (Unsigned)
 Subjective:  Patient ID: Clarence Davis, male    DOB: 08-20-1960  Age: 63 y.o. MRN: [id number]  Chief Complaint  Patient presents with   Medical Management of Chronic Issues    HPI: Discussed the use of AI scribe software for clinical note transcription with the patient, who gave verbal consent to proceed.  Discussed the use of AI scribe software for clinical note transcription with the patient, who gave verbal consent to proceed.  History of Present Illness   Clarence Davis is a 63 year old male who presents with concerns about possible cirrhosis and atherosclerosis. Accompanied by his wife.   Hepatic abnormalities - Recent CT scan report indicated possible cirrhosis, not previously identified on liver ultrasound in March or prior abdominal CT scan - No alcohol consumption for the past 15 years - History of hepatitis C infection 20 years ago, treated with injections and medication  Atherosclerotic disease and cardiovascular symptoms - Recent CT scan identified arterial plaque in the aorta and coronary arteries - Sensation of heart 'vibrating like a flutter' - Occasional headaches - Currently taking a statin medication every evening  Tobacco use - History of smoking, currently reducing cigarette consumption - Reduced to three cigarettes  since the beginning of the month from a previous habit of one pack per day  Urinary changes - Strong-smelling and dark urine - Attributes urine changes to decreased water  intake  Glycemic symptoms - No diabetes diagnosis - Occasional shakes when not eating  Pulmonary symptoms - Diagnosed with emphysema - No dyspnea on exertion - Does not use an inhaler - Able to hold breath for four minutes in a pool            12/20/2023    3:39 PM 11/23/2023    9:50 AM 02/18/2023    3:15 PM 01/21/2023    8:34 AM  Depression screen PHQ 2/9  Decreased Interest 0 0 1 0  Down, Depressed, Hopeless 0 0 0 0  PHQ - 2 Score 0 0 1 0  Altered sleeping    2 3  Tired, decreased energy   0 3  Change in appetite   1 3  Feeling bad or failure about yourself    0 0  Trouble concentrating   0 0  Moving slowly or fidgety/restless   0 0  Suicidal thoughts   0 0  PHQ-9 Score   4 9  Difficult doing work/chores   Not difficult at all Very difficult        12/20/2023    3:39 PM  Fall Risk   Falls in the past year? 0  Number falls in past yr: 0  Injury with Fall? 0  Risk for fall due to : No Fall Risks  Follow up Falls evaluation completed    Patient Care Team: Anastasya Jewell, MD as PCP - General (Family Medicine) Skeet Juliene SAUNDERS, DO as Consulting Physician (Neurology)   Review of Systems  Constitutional:  Negative for chills, fatigue and fever.  HENT:  Negative for congestion, ear pain, sinus pressure and sore throat.   Respiratory:  Positive for cough and wheezing. Negative for shortness of breath.   Cardiovascular:  Negative for chest pain.  Gastrointestinal:  Positive for abdominal pain. Negative for constipation, diarrhea, nausea and vomiting.  Genitourinary:  Negative for dysuria and frequency.  Musculoskeletal:  Negative for arthralgias, back pain and myalgias.  Neurological:  Negative for dizziness and headaches.  Psychiatric/Behavioral:  Negative for dysphoric mood. The patient is nervous/anxious.  Current Outpatient Medications on File Prior to Visit  Medication Sig Dispense Refill   atorvastatin  (LIPITOR) 40 MG tablet Take 1 tablet (40 mg total) by mouth daily. 90 tablet 3   citalopram  (CELEXA ) 20 MG tablet TAKE 1 TABLET EVERY DAY 90 tablet 3   dicyclomine  (BENTYL ) 20 MG tablet Take 1 tablet (20 mg total) by mouth 3 (three) times daily before meals for 20 days. 60 tablet 0   Multiple Vitamin (MULTIVITAMIN WITH MINERALS) TABS tablet Take 1 tablet by mouth daily.     omeprazole  (PRILOSEC) 40 MG capsule TAKE 1 CAPSULE TWICE DAILY 180 capsule 3   tamsulosin  (FLOMAX ) 0.4 MG CAPS capsule TAKE 1 CAPSULE EVERY DAY 90 capsule 3    No current facility-administered medications on file prior to visit.   Past Medical History:  Diagnosis Date   Alcoholic (HCC)    quit drinking many yrs ago per pt   Anxiety    Arthritis    hands (04/05/2015)   Bilateral carpal tunnel syndrome    Chronic back pain    all over   Chronic diarrhea    Depression    takes Citalopram  daily   Dry skin    Dysrhythmia    PVC's    GERD (gastroesophageal reflux disease)    takes Omeprazole  daily   Head injury    early 90's   Headache    weekly (04/05/2015)   Hepatitis C    treated w/RX; don't have it anymore (04/05/2015)   Past Surgical History:  Procedure Laterality Date   ANTERIOR CERVICAL DECOMP/DISCECTOMY FUSION N/A 12/15/2013   Procedure: CERVICAL FOUR TO FIVE, CERVICAL FIVE TO SIX, CERVICAL SIX TO SEVEN CERVICAL DECOMPRESSION/DISCECTOMY FUSION 3 LEVELS;  Surgeon: Catalina CHRISTELLA Stains, MD;  Location: [facility];  Service: Neurosurgery;  Laterality: N/A;  C4-5 C5-6 C6-7 Anterior cervical decompression/diskectomy/fusion   BACK SURGERY     CLOSED REDUCTION SHOULDER DISLOCATION Left 1975   patient denies   ESOPHAGOGASTRODUODENOSCOPY     HERNIA REPAIR     INGUINAL HERNIA REPAIR Left X 3      POSTERIOR CERVICAL FUSION/FORAMINOTOMY  04/05/2015   POSTERIOR CERVICAL FUSION/FORAMINOTOMY N/A 04/05/2015   Procedure: Cervical three-four, Cervical four-five, Cervical five-six, Cervical six-seven Posterior cervical fusion with lateral mass fixation;  Surgeon: Catalina Stains, MD;  Location: [facility];  Service: Neurosurgery;  Laterality: N/A;  C3-4 C4-5 C5-6 C6-7 Posterior cervical fusion with lateral mass fixation   UMBILICAL HERNIA REPAIR     WISDOM TOOTH EXTRACTION      Family History  Problem Relation Age of Onset   Throat cancer Father    Heart disease Sister    Colon cancer Neg Hx    Stomach cancer Neg Hx    Esophageal cancer Neg Hx    Social History   Socioeconomic History   Marital status: Married    Spouse  name: Not on file   Number of children: 2   Years of education: Not on file   Highest education level: 12th grade  Occupational History   Occupation: disabled  Tobacco Use   Smoking status: Some Days    Current packs/day: 0.00    Average packs/day: 0.2 packs/day for 28.0 years (4.2 ttl pk-yrs)    Types: Cigarettes    Start date: 02/25/1987    Last attempt to quit: 02/25/2015    Years since quitting: 8.8   Smokeless tobacco: Never  Vaping Use   Vaping status: Not on file  Substance and Sexual Activity  Alcohol use: Never    Comment: 04/05/2015 recovering alcoholic; last drink 01/06/2011   Drug use: Yes    Frequency: 2.0 times per week    Types: Marijuana    Comment: last 07/08/23   Sexual activity: Not Currently  Other Topics Concern   Not on file  Social History Narrative   Patient is left-handed. He lives with his long time girlfriend Adrien Dolores in a one level home home. He drinks 3 cups of coffee and 4 Coca-Colas a day. He tries to walk when able.   Social Drivers of Corporate investment banker Strain: Low Risk  (02/18/2023)   Overall Financial Resource Strain (CARDIA)    Difficulty of Paying Living Expenses: Not hard at all  Food Insecurity: No Food Insecurity (02/18/2023)   Hunger Vital Sign    Worried About Running Out of Food in the Last Year: Never true    Ran Out of Food in the Last Year: Never true  Transportation Needs: No Transportation Needs (02/18/2023)   PRAPARE - Administrator, Civil Service (Medical): No    Lack of Transportation (Non-Medical): No  Physical Activity: Inactive (02/18/2023)   Exercise Vital Sign    Days of Exercise per Week: 0 days    Minutes of Exercise per Session: 0 min  Stress: No Stress Concern Present (02/18/2023)   [employer] of Occupational Health - Occupational Stress Questionnaire    Feeling of Stress : Not at all  Social Connections: Moderately Integrated (02/18/2023)   Social Connection and Isolation  Panel    Frequency of Communication with Friends and Family: More than three times a week    Frequency of Social Gatherings with Friends and Family: Three times a week    Attends Religious Services: Never    Active Member of Clubs or Organizations: Yes    Attends Engineer, structural: More than 4 times per year    Marital Status: Living with partner    Objective:  BP 106/60   Pulse 89   Temp 97.6 F (36.4 C)   Ht 5' 10 (1.778 m)   Wt 159 lb (72.1 kg)   SpO2 98%   BMI 22.81 kg/m      12/20/2023    3:35 PM 11/23/2023    9:47 AM 07/09/2023    3:35 PM  BP/Weight  Systolic BP 106 104 92  Diastolic BP 60 70 56  Wt. (Lbs) 159 157   BMI 22.81 kg/m2 22.53 kg/m2     Physical Exam Vitals and nursing note reviewed.  HENT:     Head: Normocephalic and atraumatic.  Cardiovascular:     Rate and Rhythm: Normal rate and regular rhythm.  Pulmonary:     Effort: Pulmonary effort is normal.     Breath sounds: Wheezing present.  Abdominal:     General: Bowel sounds are normal. There is no distension.     Palpations: There is no mass.     Tenderness: There is no abdominal tenderness.  Musculoskeletal:        General: Normal range of motion.  Skin:    General: Skin is warm.  Neurological:     Mental Status: He is alert and oriented to person, place, and time.  Psychiatric:        Mood and Affect: Mood normal.         Lab Results  Component Value Date   WBC 5.3 11/23/2023   HGB 13.3 11/23/2023   HCT 40.0 11/23/2023  PLT 154 11/23/2023   GLUCOSE 65 (L) 11/23/2023   CHOL 163 11/23/2023   TRIG 37 11/23/2023   HDL 63 11/23/2023   LDLCALC 92 11/23/2023   ALT 19 11/23/2023   AST 28 11/23/2023   NA 140 11/23/2023   K 4.3 11/23/2023   CL 103 11/23/2023   CREATININE 1.07 11/23/2023   BUN 9 11/23/2023   CO2 23 11/23/2023   HGBA1C 5.5 11/23/2023      Assessment & Plan:  History of hepatitis C -     Hepatitis C antibody -     HCV RNA quant  Hyperlipidemia LDL  goal <130 Assessment & Plan: CT scan revealed atherosclerosis in the aorta and three-vessel coronary artery involvement, likely due to smoking and possible familial hypercholesterolemia. - Continue statin therapy daily in the evening - Encourage smoking cessation to reduce cardiovascular risk   Borderline diabetes  Obstructive chronic bronchitis without exacerbation (HCC) Assessment & Plan: Emphysema likely due to smoking history. Reports occasional shortness of breath, does not use an inhaler, able to hold breath for extended periods, no significant exertional dyspnea. - Consider inhaler if symptoms worsen or become more frequent, but he is reluctant to try inhalers. - Encourage smoking cessation to prevent progression   Chronic abdominal pain Assessment & Plan: Has chronic abdominal pain of unclear etiology, currently being evaluated by gastroenterology..  Suspected cirrhosis of liver (pending evaluation) with treated hepatitis C Suspicion of cirrhosis based on recent CT scan for lung cancer screening. Previous liver ultrasound and abdominal CT did not indicate cirrhosis. History of treated hepatitis C could be a contributing factor. Diagnosis of cirrhosis is not confirmed, further evaluation needed. - I discussed with gastroenterology who will order Fibroscan to assess liver stiffness and confirm cirrhosis diagnosis - He is already Placed on cancellation list for earlier Fibroscan appointment - Monitor for complications of cirrhosis such as portal hypertension, ascites, and variceal bleeding - Order hepatitis C antibody and viral load tests to assess current status   Tobacco use disorder Assessment & Plan: Actively trying to reduce smoking, decreased from a pack a day to three cigarettes since the beginning of the month. Using candy as a substitute. Chantix  prescribed with a coupon to reduce cost. Discussed potential use of nicotine  gum as an alternative. - Prescribe Chantix  with a  coupon to reduce cost - Encourage continued reduction in smoking - Discuss potential use of nicotine  gum as an alternative Time spent counseling 5 minutes   Other orders -     Varenicline  Tartrate; Take 1 tablet (1 mg total) by mouth 2 (two) times daily.  Dispense: 56 tablet; Refill: 0     Body mass index is 22.81 kg/m.  Assessment and Plan  General Health Maintenance Due for colonoscopy as part of routine health maintenance. - Discuss need for colonoscopy with gastroenterologist during upcoming appointment          Meds ordered this encounter  Medications   varenicline  (CHANTIX  CONTINUING MONTH PAK) 1 MG tablet    Sig: Take 1 tablet (1 mg total) by mouth 2 (two) times daily.    Dispense:  56 tablet    Refill:  0    Patient will pick up using a Good rx coupon at the beginning of next month    Orders Placed This Encounter  Procedures   Hepatitis C antibody   HCV RNA quant       Follow-up: No follow-ups on file.  An After Visit Summary was printed and given to  the patient.  Michell Kader, MD [facility] [phone]

## 2023-12-21 ENCOUNTER — Ambulatory Visit: Payer: Self-pay

## 2023-12-21 LAB — HCV RNA QUANT: Hepatitis C Quantitation: NOT DETECTED [IU]/mL

## 2023-12-21 LAB — HEPATITIS C ANTIBODY: Hep C Virus Ab: REACTIVE — AB

## 2023-12-21 NOTE — Assessment & Plan Note (Signed)
 Emphysema likely due to smoking history. Reports occasional shortness of breath, does not use an inhaler, able to hold breath for extended periods, no significant exertional dyspnea. - Consider inhaler if symptoms worsen or become more frequent, but he is reluctant to try inhalers. - Encourage smoking cessation to prevent progression

## 2023-12-21 NOTE — Assessment & Plan Note (Signed)
 CT scan revealed atherosclerosis in the aorta and three-vessel coronary artery involvement, likely due to smoking and possible familial hypercholesterolemia. - Continue statin therapy daily in the evening - Encourage smoking cessation to reduce cardiovascular risk

## 2023-12-21 NOTE — Assessment & Plan Note (Signed)
 Actively trying to reduce smoking, decreased from a pack a day to three cigarettes since the beginning of the month. Using candy as a substitute. Chantix  prescribed with a coupon to reduce cost. Discussed potential use of nicotine  gum as an alternative. - Prescribe Chantix  with a coupon to reduce cost - Encourage continued reduction in smoking - Discuss potential use of nicotine  gum as an alternative Time spent counseling 5 minutes

## 2023-12-21 NOTE — Patient Instructions (Signed)
  VISIT SUMMARY: Today, we discussed your concerns about possible cirrhosis and atherosclerosis, along with other health issues including emphysema and tobacco use. We have planned further evaluations and adjustments to your current treatments to address these concerns.  YOUR PLAN: SUSPECTED CIRRHOSIS OF LIVER: Your recent CT scan suggested possible cirrhosis, which was not seen in previous tests. This may be related to your past hepatitis C infection. -We will order a Fibroscan to assess liver stiffness and confirm the diagnosis. -You will be placed on a cancellation list for an earlier Fibroscan appointment. -We will monitor for complications such as portal hypertension, ascites, and variceal bleeding. -We will order hepatitis C antibody and viral load tests to check your current status.  ATHEROSCLEROSIS OF AORTA AND CORONARY ARTERIES: Your CT scan showed plaque in your aorta and coronary arteries, likely due to smoking and possibly high cholesterol. -Continue taking your statin medication every evening. -It is important to continue reducing and eventually quit smoking to lower your cardiovascular risk.  HYPERLIPIDEMIA: Your high cholesterol is being managed with statin therapy, and there may be a familial component. -Continue taking your statin medication as prescribed.  CHRONIC OBSTRUCTIVE PULMONARY DISEASE (COPD)/EMPHYSEMA: Your emphysema is likely due to your smoking history. You have occasional shortness of breath but do not use an inhaler. -Consider using an inhaler if your symptoms worsen or become more frequent. -Continue to work on quitting smoking to prevent further progression of your condition.  TOBACCO USE DISORDER: You are actively trying to reduce smoking and have decreased your consumption significantly. You are using candy as a substitute and have been prescribed Chantix . -Take Chantix  as prescribed, and use the coupon to reduce the cost. -Continue to reduce your  smoking. -Consider using nicotine  gum as an alternative.  GENERAL HEALTH MAINTENANCE: You are due for a colonoscopy as part of your routine health maintenance. -Discuss the need for a colonoscopy with your gastroenterologist during your upcoming appointment.                      Contains text generated by Abridge.                                 Contains text generated by Abridge.

## 2023-12-21 NOTE — Assessment & Plan Note (Signed)
 Has chronic abdominal pain of unclear etiology, currently being evaluated by gastroenterology..  Suspected cirrhosis of liver (pending evaluation) with treated hepatitis C Suspicion of cirrhosis based on recent CT scan for lung cancer screening. Previous liver ultrasound and abdominal CT did not indicate cirrhosis. History of treated hepatitis C could be a contributing factor. Diagnosis of cirrhosis is not confirmed, further evaluation needed. - I discussed with gastroenterology who will order Fibroscan to assess liver stiffness and confirm cirrhosis diagnosis - He is already Placed on cancellation list for earlier Fibroscan appointment - Monitor for complications of cirrhosis such as portal hypertension, ascites, and variceal bleeding - Order hepatitis C antibody and viral load tests to assess current status

## 2024-01-05 ENCOUNTER — Ambulatory Visit: Payer: Self-pay

## 2024-01-05 NOTE — Telephone Encounter (Signed)
 Patient states he fell off of the couch a couple of weeks ago onto hardwood floor. Patient endorses he had been sleeping  but fell off due to nightmare. Patient reports having violent nightmares. Patient is unsure of how he fell but reports shortness of breath, severe rib pain and having difficulty walking. Patient is recommended to be seen in the ED due to symptoms. Patient verbalized understanding and all questions answered. Patient to go to the ED once his wife comes home.   FYI Only or Action Required?: FYI only for provider.  Patient was last seen in primary care on 12/20/2023 by [name], [name], MD.  Called Nurse Triage reporting Rib Injury.  Symptoms began several weeks ago.  Interventions attempted: Rest, hydration, or home remedies.  Symptoms are: gradually worsening.  Triage Disposition: Go to ED Now (Notify PCP)  Patient/caregiver understands and will follow disposition?: Yes  Copied from CRM [id number]. Topic: Clinical - Red Word Triage >> Jan 05, 2024 11:12 AM [name] wrote: Clarence Davis off couch after having a nightmare, Pain in rib cage ,difficulty walking, and states he can't breathe, Pt states he does not have the $20 to go to urgent care nor can he take the drive. Would like for his provider to order a Xray Reason for Disposition  SEVERE chest pain  Answer Assessment - Initial Assessment Questions 1. MECHANISM: How did the injury happen?     Patient states that he fell off the couch a couple of weeks ago with a nightmare. Patient states he has violent nightmares 2. ONSET: When did the injury happen? (.e.g., minutes, hours, days ago)     A couple of weeks ago 3. LOCATION: Where on the chest is the injury located?     Clarence Davis on the floor-patient states he is unsure of how he fell-did fall on hardwood floor.  4. APPEARANCE: What does the injury look like?     Swelling to chest wall 5. BLEEDING: Is there any bleeding now? If Yes, ask: How long has it been bleeding?      no 6. SEVERITY: Any difficulty with breathing?     Difficulty breathing-moderate 7. SIZE: For cuts, bruises, or swelling, ask: How large is it? (e.g., inches or centimeters)     Large area of swelling.  8. PAIN: Is there pain? If Yes, ask: How bad is the pain? (e.g., Scale 0-10; none, mild, moderate, severe)     7 to 8 out of 10  Protocols used: Chest Injury-A-AH

## 2024-01-06 ENCOUNTER — Encounter (HOSPITAL_BASED_OUTPATIENT_CLINIC_OR_DEPARTMENT_OTHER): Payer: Self-pay

## 2024-01-06 ENCOUNTER — Ambulatory Visit (HOSPITAL_BASED_OUTPATIENT_CLINIC_OR_DEPARTMENT_OTHER): Admitting: Radiology

## 2024-01-06 ENCOUNTER — Ambulatory Visit (HOSPITAL_BASED_OUTPATIENT_CLINIC_OR_DEPARTMENT_OTHER)
Admission: EM | Admit: 2024-01-06 | Discharge: 2024-01-06 | Disposition: A | Discharge status: Home / Self Care | Attending: Family Medicine | Admitting: Family Medicine

## 2024-01-06 ENCOUNTER — Other Ambulatory Visit (HOSPITAL_BASED_OUTPATIENT_CLINIC_OR_DEPARTMENT_OTHER): Payer: Self-pay

## 2024-01-06 DIAGNOSIS — S2232XA Fracture of one rib, left side, initial encounter for closed fracture: Secondary | ICD-10-CM

## 2024-01-06 DIAGNOSIS — R0781 Pleurodynia: Secondary | ICD-10-CM | POA: Diagnosis not present

## 2024-01-06 DIAGNOSIS — R0789 Other chest pain: Secondary | ICD-10-CM

## 2024-01-06 DIAGNOSIS — M25512 Pain in left shoulder: Secondary | ICD-10-CM

## 2024-01-06 DIAGNOSIS — W010XXA Fall on same level from slipping, tripping and stumbling without subsequent striking against object, initial encounter: Secondary | ICD-10-CM

## 2024-01-06 DIAGNOSIS — M19012 Primary osteoarthritis, left shoulder: Secondary | ICD-10-CM | POA: Diagnosis not present

## 2024-01-06 DIAGNOSIS — S298XXA Other specified injuries of thorax, initial encounter: Secondary | ICD-10-CM | POA: Diagnosis not present

## 2024-01-06 MED ORDER — KETOROLAC TROMETHAMINE 10 MG PO TABS
10.0000 mg | ORAL_TABLET | Freq: Four times a day (QID) | ORAL | 0 refills | Status: DC | PRN
  Filled 2024-01-06: qty 20, 5d supply, fill #0

## 2024-01-06 MED ORDER — KETOROLAC TROMETHAMINE 30 MG/ML IJ SOLN
30.0000 mg | Freq: Once | INTRAMUSCULAR | Status: AC
  Administered 2024-01-06: 30 mg via INTRAMUSCULAR

## 2024-01-06 NOTE — ED Triage Notes (Signed)
 Pt states 2 weeks ago he was sleeping on the cough when he fell of the couch. Pt didn't have nay pain after the fall but had slight bruising on his left arm. A few days after the fall he started to have left back pain. Around Tuesday the pain started radiating to his left ribs. States it is difficult to take a deep breath. Pt has taken tylenol  with no relief.

## 2024-01-06 NOTE — ED Provider Notes (Signed)
 [name]    CSN: [id number] Arrival date & time: 01/06/24  1330      History   Chief Complaint Chief Complaint  Patient presents with   Fall   Rib Injury    HPI Clarence Davis is a 63 y.o. male.   63 year old male who reports that he fell out of a recliner onto the floor due to nightmares twice in one night on 01/01/2024 or earlier.  Initially he did not feel bad but as time is gone by he has significant left rib and left shoulder pain.  He had significant pain by 01/04/2024.  He is concerned that he might have broken a rib.  He is having trouble taking a deep breath.  Tylenol  has provided no relief at all.   Fall Associated symptoms include chest pain (Rib pain on the). Pertinent negatives include no abdominal pain.    Past Medical History:  Diagnosis Date   Alcoholic (HCC)    quit drinking many yrs ago per pt   Anxiety    Arthritis    hands (04/05/2015)   Bilateral carpal tunnel syndrome    Chronic back pain    all over   Chronic diarrhea    Depression    takes Citalopram  daily   Dry skin    Dysrhythmia    PVC's    GERD (gastroesophageal reflux disease)    takes Omeprazole  daily   Head injury    early 90's   Headache    weekly (04/05/2015)   Hepatitis C    treated w/RX; don't have it anymore (04/05/2015)    Patient Active Problem List   Diagnosis Date Noted   History of hepatitis C 12/20/2023   Borderline diabetes 12/20/2023   Right-sided chest pain 11/23/2023   Sensation of skin crawling 11/23/2023   Chronic pruritus 11/23/2023   Chronic GERD 01/21/2023   Chronic abdominal pain 01/21/2023   Elevated blood sugar 01/21/2023   Hyperlipidemia LDL goal <130 01/21/2023   Screening for prostate cancer 01/21/2023   Tobacco use disorder 01/21/2023   Chronic mid back pain 01/21/2023   Non-seasonal allergic rhinitis 07/15/2020   Snoring 07/15/2020   Abdominal pain 06/09/2020   Sinusitis 03/05/2020   Cervical post-laminectomy syndrome  09/28/2019   History of lumbar fusion 09/28/2019   Pain medication agreement 09/28/2019   Thoracic back pain 09/28/2019   PTSD (post-traumatic stress disorder) 06/15/2019   OSA (obstructive sleep apnea) 06/15/2019   Inguinal hernia, right 05/23/2019   Primary generalized (osteo)arthritis 05/22/2019   Enlarged prostate with urinary obstruction 05/22/2019   Left rotator cuff tear 05/09/2019   Obstructive chronic bronchitis without exacerbation (HCC) 05/08/2019   Lumbar adjacent segment disease with spondylolisthesis 02/25/2016   Cervical pseudoarthrosis (HCC) 04/05/2015   Cervical stenosis of spinal canal 12/15/2013    Past Surgical History:  Procedure Laterality Date   ANTERIOR CERVICAL DECOMP/DISCECTOMY FUSION N/A 12/15/2013   Procedure: CERVICAL FOUR TO FIVE, CERVICAL FIVE TO SIX, CERVICAL SIX TO SEVEN CERVICAL DECOMPRESSION/DISCECTOMY FUSION 3 LEVELS;  Surgeon: Catalina CHRISTELLA Stains, MD;  Location: [facility];  Service: Neurosurgery;  Laterality: N/A;  C4-5 C5-6 C6-7 Anterior cervical decompression/diskectomy/fusion   BACK SURGERY     CLOSED REDUCTION SHOULDER DISLOCATION Left 1975   patient denies   ESOPHAGOGASTRODUODENOSCOPY     HERNIA REPAIR     INGUINAL HERNIA REPAIR Left X 3      POSTERIOR CERVICAL FUSION/FORAMINOTOMY  04/05/2015   POSTERIOR CERVICAL FUSION/FORAMINOTOMY N/A 04/05/2015   Procedure: Cervical three-four, Cervical four-five,  Cervical five-six, Cervical six-seven Posterior cervical fusion with lateral mass fixation;  Surgeon: Catalina Stains, MD;  Location: [facility];  Service: Neurosurgery;  Laterality: N/A;  C3-4 C4-5 C5-6 C6-7 Posterior cervical fusion with lateral mass fixation   UMBILICAL HERNIA REPAIR     WISDOM TOOTH EXTRACTION         Home Medications    Prior to Admission medications   Medication Sig Start Date End Date Taking? Authorizing Provider  ketorolac  (TORADOL ) 10 MG tablet Take 1 tablet (10 mg total) by mouth every 6 (six) hours as needed.  01/06/24  Yes Ival Domino, FNP  atorvastatin  (LIPITOR) 40 MG tablet Take 1 tablet (40 mg total) by mouth daily. 12/15/23   [name], [name], MD  citalopram  (CELEXA ) 20 MG tablet TAKE 1 TABLET EVERY DAY 06/17/23   [name], [name], MD  dicyclomine  (BENTYL ) 20 MG tablet Take 1 tablet (20 mg total) by mouth 3 (three) times daily before meals for 20 days. 03/18/23 12/20/23  [name], [name], MD  Multiple Vitamin (MULTIVITAMIN WITH MINERALS) TABS tablet Take 1 tablet by mouth daily.    [provider]  omeprazole  (PRILOSEC) 40 MG capsule TAKE 1 CAPSULE TWICE DAILY 06/17/23   [name], [name], MD  tamsulosin  (FLOMAX ) 0.4 MG CAPS capsule TAKE 1 CAPSULE EVERY DAY 09/27/23   [name], [name], MD  varenicline  (CHANTIX  CONTINUING MONTH PAK) 1 MG tablet Take 1 tablet (1 mg total) by mouth 2 (two) times daily. 12/20/23   [name], [name], MD    Family History Family History  Problem Relation Age of Onset   Throat cancer Father    Heart disease Sister    Colon cancer Neg Hx    Stomach cancer Neg Hx    Esophageal cancer Neg Hx     Social History Social History   Tobacco Use   Smoking status: Every Day    Current packs/day: 0.00    Average packs/day: 0.2 packs/day for 28.0 years (4.2 ttl pk-yrs)    Types: Cigarettes    Start date: 02/25/1987    Last attempt to quit: 02/25/2015    Years since quitting: 8.8   Smokeless tobacco: Never  Vaping Use   Vaping status: Never Used  Substance Use Topics   Alcohol use: Not Currently    Comment: 04/05/2015 recovering alcoholic; last drink 01/06/2011   Drug use: Yes    Frequency: 2.0 times per week    Types: Marijuana    Comment: last 07/08/23     Allergies   Tramadol   Review of Systems Review of Systems  Constitutional:  Negative for fever.  Respiratory:  Negative for cough.   Cardiovascular:  Positive for chest pain (Rib pain on the).  Gastrointestinal:  Negative for abdominal pain, constipation, diarrhea, nausea and vomiting.   Musculoskeletal:  Positive for joint swelling (Left shoulder pain). Negative for arthralgias and back pain.  Skin:  Negative for color change and rash.  Neurological:  Negative for syncope.  All other systems reviewed and are negative.    Physical Exam Triage Vital Signs ED Triage Vitals  Encounter Vitals Group     BP 01/06/24 1418 (!) 168/89     Girls Systolic BP Percentile --      Girls Diastolic BP Percentile --      Boys Systolic BP Percentile --      Boys Diastolic BP Percentile --      Pulse Rate 01/06/24 1418 94     Resp 01/06/24 1418 20     Temp 01/06/24  1418 98 F (36.7 C)     Temp Source 01/06/24 1418 Oral     SpO2 01/06/24 1418 98 %     Weight --      Height --      Head Circumference --      Peak Flow --      Pain Score 01/06/24 1415 10     Pain Loc --      Pain Education --      Exclude from Growth Chart --    No data found.  Updated Vital Signs BP (!) 168/89 (BP Location: Right Arm)   Pulse 94   Temp 98 F (36.7 C) (Oral)   Resp 20   SpO2 98%   Visual Acuity Right Eye Distance:   Left Eye Distance:   Bilateral Distance:    Right Eye Near:   Left Eye Near:    Bilateral Near:     Physical Exam Vitals and nursing note reviewed.  Constitutional:      General: He is not in acute distress.    Appearance: He is well-developed. He is not ill-appearing or toxic-appearing.  HENT:     Head: Normocephalic and atraumatic.     Right Ear: Hearing, tympanic membrane, ear canal and external ear normal.     Left Ear: Hearing, tympanic membrane, ear canal and external ear normal.     Nose: No congestion or rhinorrhea.     Right Sinus: No maxillary sinus tenderness or frontal sinus tenderness.     Left Sinus: No maxillary sinus tenderness or frontal sinus tenderness.     Mouth/Throat:     Lips: Pink.     Mouth: Mucous membranes are moist.     Pharynx: Uvula midline. No oropharyngeal exudate or posterior oropharyngeal erythema.     Tonsils: No tonsillar  exudate.  Eyes:     Conjunctiva/sclera: Conjunctivae normal.     Pupils: Pupils are equal, round, and reactive to light.  Cardiovascular:     Rate and Rhythm: Normal rate and regular rhythm.     Heart sounds: S1 normal and S2 normal. No murmur heard. Pulmonary:     Effort: Pulmonary effort is normal. No respiratory distress.     Breath sounds: Normal breath sounds. No decreased breath sounds, wheezing, rhonchi or rales.     Comments: Breath sounds are clear but patient was actually intermittently screaming when he tried to take a deep breath. Chest:     Chest wall: Tenderness (Significant tenderness in the anterior and posterior lower ribs from the 5th or 6th rib to the 10th rib on the left.  No bruising.  No visual abnormality of contour or or asymmetry.) present. No mass, lacerations, deformity, swelling, crepitus or edema. There is no dullness to percussion.  Abdominal:     General: Bowel sounds are normal.     Palpations: Abdomen is soft.     Tenderness: There is no abdominal tenderness.  Musculoskeletal:        General: No swelling.     Cervical back: Neck supple.  Lymphadenopathy:     Head:     Right side of head: No submental, submandibular, tonsillar, preauricular or posterior auricular adenopathy.     Left side of head: No submental, submandibular, tonsillar, preauricular or posterior auricular adenopathy.     Cervical: No cervical adenopathy.     Right cervical: No superficial cervical adenopathy.    Left cervical: No superficial cervical adenopathy.  Skin:    General: Skin is warm  and dry.     Capillary Refill: Capillary refill takes less than 2 seconds.     Findings: No rash.  Neurological:     Mental Status: He is alert and oriented to person, place, and time.  Psychiatric:        Mood and Affect: Mood normal.      UC Treatments / Results  Labs (all labs ordered are listed, but only abnormal results are displayed) Comprehensive metabolic panel with GFR: 11/23/23:      Component Ref Range & Units (hover) 1 mo ago  Glucose 65 Low   BUN 9  Creatinine, Ser 1.07  eGFR 78  BUN/Creatinine Ratio 8 Low   Sodium 140  Potassium 4.3  Chloride 103  CO2 23  Calcium  9.5  Total Protein 6.7  Albumin 4.5  Globulin, Total 2.2  Bilirubin Total 0.4  Alkaline Phosphatase 77  AST 28  ALT 19  Resulting Agency [facility]      EKG   Radiology DG Shoulder Left Result Date: 01/06/2024 EXAM: 1 VIEW XRAY OF THE LEFT SHOULDER 01/06/2024 03:25:59 PM COMPARISON: None available. CLINICAL HISTORY: Shoulder pain 144754 FINDINGS: BONES AND JOINTS: Moderate glenohumeral osteoarthritis. Moderate hypertrophic changes of the acromioclavicular joint. Partially visualized cervical spine fusion hardware in place. No acute fracture or dislocation. SOFT TISSUES: No abnormal calcifications. Visualized portions of the left chest are clear. IMPRESSION: 1. No acute abnormalities. 2. Moderate left shoulder osteoarthritis. Electronically signed by: Ozell Daring MD 01/06/2024 03:42 PM EDT RP Workstation: [id number]   DG Ribs Unilateral W/Chest Left Result Date: 01/06/2024 EXAM: 1 VIEW(S) XRAY OF THE LEFT RIBS AND CHEST 01/06/2024 03:25:59 PM COMPARISON: None available. CLINICAL HISTORY: Left rib and left shoulder pain. FINDINGS: BONES: Cervical spine fusion hardware noted. Chronic healed right lateral 10th rib fracture. Minimally displaced left anterolateral 11th rib fracture. LUNGS AND PLEURA: No consolidation or pulmonary edema. No pleural effusion or pneumothorax. HEART AND MEDIASTINUM: No acute abnormality of the cardiac and mediastinal silhouettes. IMPRESSION: 1. Minimally displaced acute  left anterolateral 11th rib fracture. 2. No pleural effusion or pneumothorax. Electronically signed by: Ozell Daring MD 01/06/2024 03:40 PM EDT RP Workstation: [id number]    Procedures Procedures (including critical care time)  Medications Ordered in UC Medications  ketorolac  (TORADOL ) 30 MG/ML  injection 30 mg (30 mg Intramuscular Given 01/06/24 1543)    Initial Impression / Assessment and Plan / UC Course  I have reviewed the triage vital signs and the nursing notes.  Pertinent labs & imaging results that were available during my care of the patient were reviewed by me and considered in my medical decision making (see chart for details).  Plan of Care: Left 11th rib fracture, rib contusions  with left rib and left shoulder pain after fall: X-rays appear negative.  Will update the patient if the radiology review differs.  Right from 11/23/2023 show excellent kidney function.  Will treat with ketorolac  30 mg injection during the visit.  Then ketorolac  10 mg every 6 hours if needed for pain.  Encouraged use of ice therapy (ice packs 30 minutes on and 30 minutes off and repeat as often as needed) for 24 to 48 hours.  Then switch to heat therapy (heat packs 30 minutes on and 30 minutes off and repeat as often as needed).   Follow-up if symptoms do not improve, worsen or new symptoms occur.  I reviewed the plan of care with the patient and/or the patient's guardian.  The patient and/or guardian had time to ask questions  and acknowledged that the questions were answered.  I provided instruction on symptoms or reasons to return here or to go to an ER, if symptoms/condition did not improve, worsened or if new symptoms occurred.  Final Clinical Impressions(s) / UC Diagnoses   Final diagnoses:  Acute pain of left shoulder  Rib pain on left side  Fall on same level from slipping, tripping or stumbling, initial encounter  Rib contusion, left, initial encounter  Fracture of left eleventh rib     Discharge Instructions      Left 11th rib fracture, rib contusions with left rib and left shoulder pain after fall: X-rays appear negative.  Will update the patient if the radiology review differs.  Right from 11/23/2023 show excellent kidney function.  Will treat with ketorolac  30 mg injection during  the visit.  Then ketorolac  10 mg every 6 hours if needed for pain.  Encouraged use of ice therapy (ice packs 30 minutes on and 30 minutes off and repeat as often as needed) for 24 to 48 hours.  Then switch to heat therapy (heat packs 30 minutes on and 30 minutes off and repeat as often as needed).   Follow-up if symptoms do not improve, worsen or new symptoms occur.     ED Prescriptions     Medication Sig Dispense Auth. Provider   ketorolac  (TORADOL ) 10 MG tablet Take 1 tablet (10 mg total) by mouth every 6 (six) hours as needed. 20 tablet Heliodoro Domagalski, FNP      I have reviewed the PDMP during this encounter.   Ival Domino, FNP 01/06/24 [phone]

## 2024-01-06 NOTE — Discharge Instructions (Addendum)
 Left 11th rib fracture, rib contusions with left rib and left shoulder pain after fall: X-rays appear negative.  Will update the patient if the radiology review differs.  Right from 11/23/2023 show excellent kidney function.  Will treat with ketorolac  30 mg injection during the visit.  Then ketorolac  10 mg every 6 hours if needed for pain.  Encouraged use of ice therapy (ice packs 30 minutes on and 30 minutes off and repeat as often as needed) for 24 to 48 hours.  Then switch to heat therapy (heat packs 30 minutes on and 30 minutes off and repeat as often as needed).   Follow-up if symptoms do not improve, worsen or new symptoms occur.

## 2024-01-18 ENCOUNTER — Ambulatory Visit

## 2024-01-18 VITALS — BP 100/50 | HR 75 | Temp 97.3°F | Resp 16 | Ht 70.0 in | Wt 161.0 lb

## 2024-01-18 DIAGNOSIS — F514 Sleep terrors [night terrors]: Secondary | ICD-10-CM | POA: Diagnosis not present

## 2024-01-18 DIAGNOSIS — N401 Enlarged prostate with lower urinary tract symptoms: Secondary | ICD-10-CM | POA: Diagnosis not present

## 2024-01-18 DIAGNOSIS — N138 Other obstructive and reflux uropathy: Secondary | ICD-10-CM

## 2024-01-18 DIAGNOSIS — Z23 Encounter for immunization: Secondary | ICD-10-CM | POA: Diagnosis not present

## 2024-01-18 DIAGNOSIS — S2232XA Fracture of one rib, left side, initial encounter for closed fracture: Secondary | ICD-10-CM | POA: Insufficient documentation

## 2024-01-18 HISTORY — DX: Sleep terrors (night terrors): F51.4

## 2024-01-18 HISTORY — DX: Fracture of one rib, left side, initial encounter for closed fracture: S22.32XA

## 2024-01-18 MED ORDER — PRAZOSIN HCL 1 MG PO CAPS: 1.0000 mg | ORAL_CAPSULE | Freq: Every day | ORAL | 1 refills | Status: DC

## 2024-01-18 NOTE — Assessment & Plan Note (Addendum)
 Experiencing frequent urination despite taking tamsulosin . Tamsulosin  was not effective. - Discontinue tamsulosin  as I have started him on prazosin for his night terrors - Refer to urology for further evaluation and management Orders:   Ambulatory referral to Urology

## 2024-01-18 NOTE — Patient Instructions (Signed)
  VISIT SUMMARY: Today, we addressed your rib pain and nightmares following a recent fall, frequent urination issues, and your liver health. We also discussed your sleep disturbances and provided an influenza vaccination.  YOUR PLAN: NIGHT TERRORS (REM SLEEP BEHAVIOR DISORDER): You are experiencing violent night terrors that lead to falls and injuries. -We have prescribed terazosin to help manage your night terrors. Please monitor for any side effects such as changes in blood pressure or skin rash, and let us  know if you experience any issues.  MINIMALLY DISPLACED ACUTE LEFT 11TH RIB FRACTURE: You have a minimally displaced fracture in your left 11th rib from your recent fall. -Continue with deep breathing exercises to prevent pneumonia. -Monitor for any changes in pain or breathing difficulties and report them to us .  BENIGN PROSTATIC HYPERPLASIA WITH LOWER URINARY TRACT SYMPTOMS: You are experiencing frequent urination despite taking tamsulosin . -Discontinue tamsulosin . -We will refer you to a urologist for further evaluation and management.  MODERATE LEFT SHOULDER OSTEOARTHRITIS: You have moderate osteoarthritis in your left shoulder. -We will continue to monitor your condition and manage symptoms as needed.  RESOLVED HEPATITIS C INFECTION: Your hepatitis C infection is resolved, but we need to further evaluate your liver health. -Follow up with a GI specialist for further evaluation of your liver status.  ENCOUNTER FOR INFLUENZA VACCINATION: You are due for your influenza vaccination as part of routine health maintenance. -We administered your influenza vaccination today.                      Contains text generated by [employer].                                 Contains text generated by [employer].
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
## 2024-01-18 NOTE — Assessment & Plan Note (Addendum)
 Experiencing violent night terrors leading to falls and injuries. - Prescribe PRAZOSIN 1 MG TO TAKE AT BEDTIME for night terrors - Monitor for side effects such as blood pressure changes or skin rash, which occur in less than 5% of users, and interactions with current medications - SINCE HE TAKES TAMSULOSIN  WHICH IS ALSO AN ALPHA BLOCKER, I DISCONTINUED IT AS IT DOES NOT SEEM TO BE HELPING HIM WITH HIS BPH SYMPTOMS. - he will trial the prazosin.

## 2024-01-18 NOTE — Assessment & Plan Note (Addendum)
 Minimally displaced acute left 11th rib fracture Sustained from falling off a couch due to nightmares. Pain is present but not severe. No significant movement of the fracture. - Encourage deep breathing exercises to prevent pneumonia - Monitor for changes in pain or breathing difficulties

## 2024-02-07 NOTE — Progress Notes (Addendum)
 "  [facility] Return Visit   Referring Provider Ivin Snuffer, MD [address] [place],  [place] [address]  Primary Care Provider [name], [name], MD  Patient Profile: Clarence Davis is a 63 y.o. male who returns to the [facility] for follow-up of the problem(s) noted below.  Problem List: GERD Dysphagia Abdominal pain Nausea Alternating constipation and diarrhea History of hepatitis C History of alcohol use disorder   History of Present Illness   Clarence Davis was last seen in the GI office 06/01/2023   Current GI Meds  Omeprazole  40 mg orally daily Dicyclomine  20 mg p.o. 3 times daily as needed abdominal pain and cramping  Interval History   Discussed the use of AI scribe software for clinical note transcription with the patient, who gave verbal consent to proceed.  History of Present Illness Clarence Davis is a 64 year old male with a past medical history noteworthy for OSA, cervical stenosis, sinusitis, musculoskeletal issues and chronic pain who returns to the gastroenterology office for follow-up of history of liver disease and concern for possible cirrhosis, loose stool and GERD  Chronic liver disease evaluation - History of hepatitis C, treated with interferon, ribavirin, Sovaldi in 2014 - Hepatitis C RNA undetectable since treatment - HCV RNA - 12/20/2023 - Heavy alcohol use for 25 years, abstinent for the past 15 years - Recent chest CT 11/2023 for lung cancer screening raised concern for possible liver cirrhosis -irregular liver margin - Liver ultrasound performed 06/2023 did not show cirrhosis or portal hypertension; focal lesions - Platelet count has intermittently been low -most recently 154 - Discussed obtaining additional dedicated liver imaging with ultrasound and elastography  Chronic diarrhea and fecal incontinence - Frequent diarrhea with stool leakage - Stools described as 'constantly wet' with occasional  bleeding - Typically has three to four bowel movements by noon - Symptoms have worsened recently, possibly related to dietary changes - Dicyclomine  once daily and fiber supplements (Raisin Bran, off-brand Metamucil) provide partial symptom control  Gastroesophageal reflux and esophageal discomfort - Reflux symptoms managed with once-daily omeprazole  40 mg - Barium esophagram 06/2023: Nonspecific esophageal dysmotility, transient lodging of barium tablet in distal esophagus, nonfixed narrowing in the distal third of the esophagus - EGD 07/2023 -normal esophageal tissue, no varices, intrinsic moderate stenosis at GE junction dilated to 16 mm, hiatal hernia - Swallowing difficulties are improved status post EGD 07/2023 - Able to tolerate coffee and milk without issue - Notes that he still moves his neck to the side in order to encourage food to go down  Abdominal wall hernia - History of hernia repairs - Current hernia causes occasional discomfort - Not seeking surgical intervention  Colorectal cancer screening - Believes he had a colonoscopy 5 years ago through [facility] demonstrating diverticulosis and internal hemorrhoids - EHR suggests that he had a colonoscopy in 2014 - No family history of colorectal cancer or polyps  GI Review of Symptoms Significant for loose stool and intermittent incontinence. Otherwise negative.  General Review of Systems  Review of systems is significant for the pertinent positives and negatives as listed per the HPI.  Full ROS is otherwise negative.  Past Medical History   Past Medical History:  Diagnosis Date   Alcoholic (HCC)    quit drinking many yrs ago per pt   Anxiety    Aortic atherosclerosis    Arthritis    hands (04/05/2015)   Bilateral carpal tunnel syndrome    Chronic back pain  all over   Chronic diarrhea    COPD (chronic obstructive pulmonary disease) (HCC)    Depression    takes Citalopram  daily   Dry skin     Dysrhythmia    PVC's    Emphysema of lung (HCC)    GERD (gastroesophageal reflux disease)    takes Omeprazole  daily   Head injury    early 90's   Headache    weekly (04/05/2015)   Hepatitis C    treated w/RX; don't have it anymore (04/05/2015)     Past Surgical History   Past Surgical History:  Procedure Laterality Date   ANTERIOR CERVICAL DECOMP/DISCECTOMY FUSION N/A 12/15/2013   Procedure: CERVICAL FOUR TO FIVE, CERVICAL FIVE TO SIX, CERVICAL SIX TO SEVEN CERVICAL DECOMPRESSION/DISCECTOMY FUSION 3 LEVELS;  Surgeon: Catalina CHRISTELLA Stains, MD;  Location: [facility];  Service: Neurosurgery;  Laterality: N/A;  C4-5 C5-6 C6-7 Anterior cervical decompression/diskectomy/fusion   BACK SURGERY     CLOSED REDUCTION SHOULDER DISLOCATION Left 1975   patient denies   ESOPHAGOGASTRODUODENOSCOPY     HERNIA REPAIR     INGUINAL HERNIA REPAIR Left X 3      POSTERIOR CERVICAL FUSION/FORAMINOTOMY  04/05/2015   POSTERIOR CERVICAL FUSION/FORAMINOTOMY N/A 04/05/2015   Procedure: Cervical three-four, Cervical four-five, Cervical five-six, Cervical six-seven Posterior cervical fusion with lateral mass fixation;  Surgeon: Catalina Stains, MD;  Location: [facility];  Service: Neurosurgery;  Laterality: N/A;  C3-4 C4-5 C5-6 C6-7 Posterior cervical fusion with lateral mass fixation   UMBILICAL HERNIA REPAIR     WISDOM TOOTH EXTRACTION       Allergies and Medications   Allergies  Allergen Reactions   Tramadol Rash and Other (See Comments)    dizzy Other reaction(s): Other (See Comments) dizzy dizzy   , Current Meds  Medication Sig   atorvastatin  (LIPITOR) 40 MG tablet Take 1 tablet (40 mg total) by mouth daily.   citalopram  (CELEXA ) 20 MG tablet TAKE 1 TABLET EVERY DAY   dicyclomine  (BENTYL ) 20 MG tablet Take 1 tablet (20 mg total) by mouth 3 (three) times daily before meals for 20 days.   Multiple Vitamin (MULTIVITAMIN WITH MINERALS) TABS tablet Take 1 tablet by mouth daily.   omeprazole   (PRILOSEC) 40 MG capsule TAKE 1 CAPSULE TWICE DAILY   prazosin  (MINIPRESS ) 1 MG capsule Take 1 capsule (1 mg total) by mouth at bedtime.     Family His   Family History  Problem Relation Age of Onset   Throat cancer Father    Heart disease Sister    Colon cancer Neg Hx    Stomach cancer Neg Hx    Esophageal cancer Neg Hx     Social History   Social History   Tobacco Use   Smoking status: Every Day    Current packs/day: 0.50    Average packs/day: 0.2 packs/day for 37.0 years (8.7 ttl pk-yrs)    Types: Cigarettes    Start date: 02/25/1987   Smokeless tobacco: Never   Tobacco comments:    Working on smoking  Vaping Use   Vaping status: Never Used  Substance Use Topics   Alcohol use: Not Currently    Comment: 04/05/2015 recovering alcoholic; last drink 01/06/2011   Drug use: Yes    Frequency: 2.0 times per week    Types: Marijuana    Comment: occ   Clarence Davis reports that he has been smoking cigarettes. He started smoking about 36 years ago. He has a 8.7 pack-year smoking history. He has never  used smokeless tobacco. He reports that he does not currently use alcohol. He reports current drug use. Frequency: 2.00 times per week. Drug: Marijuana.  Vital Signs and Physical Examination   Vitals:   02/08/24 1602  BP: 130/70  Pulse: 68   Body mass index is 23.78 kg/m. Weight: 161 lb (73 kg)  General: Chronically ill-appearing gentleman Head: Normocephalic and atraumatic Eyes: Sclerae anicteric, EOMI Lungs: Clear throughout to auscultation Heart: Regular rate and rhythm; No murmurs, rubs or bruits Abdomen: Soft, non tender and non distended. No masses, hepatosplenomegaly, Normal Bowel sounds, reducible ventral hernia present above the umbilicus Rectal: Deferred Musculoskeletal: Symmetrical with no gross deformities     Review of Data   The following data was reviewed at the time of this encounter:   Laboratory Studies      Latest Ref Rng & Units 11/23/2023    10:19 AM 01/21/2023    9:11 AM 03/05/2021   11:31 AM  CBC  WBC 3.4 - 10.8 x10E3/uL 5.3  4.8  6.2   Hemoglobin 13.0 - 17.7 g/dL 86.6  84.3  84.8   Hematocrit 37.5 - 51.0 % 40.0  47.6  44.4   Platelets 150 - 450 x10E3/uL 154  156  162     Lab Results  Component Value Date   LIPASE 38 03/05/2021      Latest Ref Rng & Units 11/23/2023   10:19 AM 01/21/2023    9:11 AM 03/05/2021   11:30 AM  CMP  Glucose 70 - 99 mg/dL 65  77  896   BUN 8 - 27 mg/dL 9  9  6    Creatinine 0.76 - 1.27 mg/dL 8.92  9.02  9.01   Sodium 134 - 144 mmol/L 140  138  138   Potassium 3.5 - 5.2 mmol/L 4.3  4.2  3.4   Chloride 96 - 106 mmol/L 103  99  104   CO2 20 - 29 mmol/L 23  23  24    Calcium  8.6 - 10.2 mg/dL 9.5  9.4  9.6   Total Protein 6.0 - 8.5 g/dL 6.7  6.9  6.8   Total Bilirubin 0.0 - 1.2 mg/dL 0.4  0.4  0.5   Alkaline Phos 44 - 121 IU/L 77  78  67   AST 0 - 40 IU/L 28  21  25    ALT 0 - 44 IU/L 19  23  24     12/20/2023 HCV antibody reactive HCV RNA negative   Imaging Studies  CT chest 12/12/2023 1. Lung-RADS 1, negative. Continue annual screening with low-dose chest CT without contrast in 12 months. 2.  Age advanced three-vessel coronary artery calcification. 3. Cirrhosis. 4.  Aortic atherosclerosis (ICD10-I70.0). 5.  Emphysema (ICD10-J43.9).    AUS 06/09/2023 Gallbladder: No gallstones or wall thickening visualized. No sonographic Murphy sign noted by sonographer.   Common bile duct: Diameter: Visualized portion measures 3 mm, within normal limits.   Liver: No focal lesion identified. Portions of the LEFT liver not visualized secondary to shadowing bowel gas. Within normal limits in parenchymal echogenicity. Portal vein is patent on color Doppler imaging with normal direction of blood flow towards the liver.   IVC: No abnormality visualized.   Pancreas: Not visualized secondary to shadowing bowel gas.   Spleen: Size and appearance within the upper limits of normal.   Right Kidney:  Length: 12.2 cm. Echogenicity within normal limits. No mass or hydronephrosis visualized.   Left Kidney: Length: 11.9 cm. Echogenicity within normal limits. No mass or  hydronephrosis visualized.   Abdominal aorta: No aneurysm visualized. The majority is not visualized secondary to shadowing bowel gas.   Other findings: None.   IMPRESSION: No sonographic etiology for abdominal pain is identified.   If persistent clinical concern, recommend dedicated cross-sectional imaging.   Barium esophagram 06/09/2023 FINDINGS: Swallowing: Appears normal. No vestibular penetration or aspiration seen.   Pharynx: Unremarkable.  Hardware seen in the cervical spine.   Esophagus: Slightly patulous thoracic esophagus. Unfixed narrowing at the distal esophagus just prior to the gastroesophageal junction.   Esophageal motility: Some stasis of barium throughout the esophagus with tertiary contractions seen.   Hiatal Hernia: None.   Gastroesophageal reflux: No spontaneous gastroesophageal reflux seen and no reflux with cough or Valsalva.   Ingested 13mm barium tablet: Tablet became stuck at the distal esophagus and required additional sips of barium for the tablet to pass into the stomach.   Other: None.   IMPRESSION: Nonspecific esophageal dysmotility.   Tablet transiently suspended at the distal esophagus.   Nonfixed narrowing at the distal third of the esophagus, potentially at site of prior reported dilatation  GI Procedures and Studies  EGD 07/09/2023 - Normal mucosa was found in the entire esophagus.  - Benign-appearing esophageal stenosis possibly related to hiatal hernia. Dilated.  - Normal gastric body, antrum, cardia and gastric fundus. Biopsied.  - Small hiatal hernia. - Normal duodenal bulb and second portion of the duodenum. Biopsied.  - Biopsies were taken with a cold forceps for evaluation of eosinophilic esophagitis.  Path: Normal esophageal, gastric and duodenal  Clarence Davis  Colonoscopy 10/31/2012  Patient reports that he may have had a colonoscopy at [facility] 5 years ago showing diverticulosis and internal hemorrhoids  Clinical Impression  It is my clinical impression that Clarence Davis is a 63 y.o. male with;  GERD Dysphagia Abdominal pain Nausea Alternating constipation and diarrhea History of hepatitis C History of alcohol use disorder  Clarence Davis returns to the office today for follow-up of multiple gastrointestinal issues.  The primary reason for his visit today is to review his history of liver disease and the possibility of cirrhosis.  He has a history of hepatitis C treated with interferon, ribavirin and Sovaldi in 2014.  HCVRNA has been negative since that time.  Chest CT 11/2023 performed for lung cancer screening showed an irregular liver margin concerning for possible cirrhosis.  6 months prior and 06/2023 abdominal ultrasound did not show any evidence of cirrhosis or portal hypertension.  Recent EGD did not reveal varices.  Given the discordance between his previous ultrasound and CT scan, I recommended performing an abdominal ultrasound with elastography to evaluate for liver fibrosis/cirrhosis.  At today's visit he is endorsing symptoms of loose stool and intermittent fecal incontinence.  The symptoms typically are improved when he consumes adequate fiber in his diet and Metamucil.  He has been taking dicyclomine  once daily and we discussed that this could be increased to 3 times a day if needed.  Suspect this is likely largely dietary related.  He was previously offered pelvic floor physical therapy which he declined.  Symptoms of GERD are generally well-controlled on omeprazole  40 mg orally daily.  EGD 07/2023 revealed a stenosis in his esophagus that was Savary dilated.  Biopsies negative for EOE, H. pylori and celiac disease.  His swallowing difficulties are improved status post dilation.  We reviewed his history of colorectal  cancer screening today.  Records indicate he had a colonoscopy in 2014, however, I cannot view the formal  report.  He thinks he may have had a colonoscopy at [facility] 5 years ago.  We do not have a copy of this report.  His primary care notes mention that he may be due for a colonoscopy.  Advised that I would request my office to try to obtain his prior reports at [facility].  If these are not available it would likely be most prudent to schedule him for an updated colonoscopy.   Plan  Schedule abdominal ultrasound with elastography Contact [facility] to determine if there are colonoscopy reports available at that facility Continue Metamucil Continue omeprazole  40 mg orally daily Continue dicyclomine  20 mg p.o. 3 times daily as needed for loose stool, abdominal discomfort, fecal urgency If fecal incontinence persists will reevaluate option of referral for pelvic floor physical therapy.  Planned Follow Up 4-6 months  The patient or caregiver verbalized understanding of the material covered, with no barriers to understanding. All questions were answered. Patient or caregiver is agreeable with the plan outlined above.    It was a pleasure to see Clarence Davis.  If you have any questions or concerns regarding this evaluation, do not hesitate to contact me.  Inocente Hausen, MD [facility]   I spent total of 35 minutes in both face-to-face (25 minutes interview) and non-face-to-face (10 minutes chart review, care coordination, documentation)  activities, excluding procedures performed, for the visit on the date of this encounter.  Addendum: Records from [facility] obtained:  Colonoscopy performed January 2023 - Five 2-3 mm polyps removed -normal tissue and hyperplastic polyps - Sigmoid colon diverticulosis - Internal hemorrhoids-patient subsequently referred for banding  Patient advised on a 5-year interval for next colonoscopy  "

## 2024-02-08 ENCOUNTER — Encounter: Payer: Self-pay | Admitting: Pediatrics

## 2024-02-08 ENCOUNTER — Ambulatory Visit: Admitting: Pediatrics

## 2024-02-08 VITALS — BP 130/70 | HR 68 | Ht 69.0 in | Wt 161.0 lb

## 2024-02-08 DIAGNOSIS — K589 Irritable bowel syndrome without diarrhea: Secondary | ICD-10-CM

## 2024-02-08 DIAGNOSIS — Z8619 Personal history of other infectious and parasitic diseases: Secondary | ICD-10-CM

## 2024-02-08 DIAGNOSIS — K219 Gastro-esophageal reflux disease without esophagitis: Secondary | ICD-10-CM

## 2024-02-08 DIAGNOSIS — Z1211 Encounter for screening for malignant neoplasm of colon: Secondary | ICD-10-CM | POA: Diagnosis not present

## 2024-02-08 DIAGNOSIS — R1310 Dysphagia, unspecified: Secondary | ICD-10-CM

## 2024-02-08 DIAGNOSIS — R159 Full incontinence of feces: Secondary | ICD-10-CM | POA: Diagnosis not present

## 2024-02-08 NOTE — Patient Instructions (Addendum)
 You have been scheduled for an abdominal ultrasound with elastography at [facility]. Your appointment is scheduled for Tuesday, 02/22/24 at 8:45 am. Please arrive 30 minutes prior to your scheduled appointment for registration purposes. Make certain not to have anything to eat or drink 6 hours prior to your procedure. Should you need to reschedule your appointment, you may contact radiology at [phone].  Liver Elastography Various chronic liver diseases such as hepatitis B, C, and fatty liver disease can lead to tissue damage and subsequent scar tissue formation. As the scar tissue accumulates, the liver loses some of its elasticity and becomes stiffer. Liver elastography involves the use of a surface ultrasound probe that delivers a low frequency pulse or shear wave to a small volume of liver tissue under the rib cage. The transmission of the sound wave is completely painless. How Is a Liver Elastography Performed? The liver is located in the right upper abdomen under the rib cage. Patients are asked to lie flat on an examination table. A technician places the FibroScan probe between the ribs on the right side of the lower chest wall. A series of 10 painless pulses are then applied to the liver. The results are recorded on the equipment and an overall liver stiffness score is generated. This score is then interpreted by a qualified physician to predict the likelihood of advanced fibrosis or cirrhosis.  Patients are asked to wear loose clothing and should not consume any liquids or solids for a minimum of 4 hours before the test to increase the likelihood of obtaining reliable test results. The scan will take 10 to 15 minutes to complete, but patients should plan on being available for 30 minutes to allow time for preparation  [facility] [place], [address], [place], [place] [address]   Follow up in 4-6 months or sooner if needed.  Thank you for entrusting me with your care and for  choosing [facility], Dr. Inocente Hausen  _______________________________________________________  If your blood pressure at your visit was 140/90 or greater, please contact your primary care physician to follow up on this.  _______________________________________________________  If you are age 62 or older, your body mass index should be between 23-30. Your Body mass index is 23.78 kg/m. If this is out of the aforementioned range listed, please consider follow up with your Primary Care Provider.  If you are age 64 or younger, your body mass index should be between 19-25. Your Body mass index is 23.78 kg/m. If this is out of the aformentioned range listed, please consider follow up with your Primary Care Provider.   ________________________________________________________  The [facility] providers would like to encourage you to use MYCHART to communicate with providers for non-urgent requests or questions.  Due to long hold times on the telephone, sending your provider a message by [facility] may be a faster and more efficient way to get a response.  Please allow 48 business hours for a response.  Please remember that this is for non-urgent requests.  _______________________________________________________  [facility] is using a team-based approach to care.  Your team is made up of your doctor and two to three APPS. Our APPS (Nurse Practitioners and Physician Assistants) work with your physician to ensure care continuity for you. They are fully qualified to address your health concerns and develop a treatment plan. They communicate directly with your gastroenterologist to care for you. Seeing the Advanced Practice Practitioners on your physician's team can help you by facilitating care more promptly, often  allowing for earlier appointments, access to diagnostic testing, procedures, and other specialty referrals.

## 2024-02-10 ENCOUNTER — Encounter: Payer: Self-pay | Admitting: Pediatrics

## 2024-02-11 ENCOUNTER — Encounter: Payer: Self-pay | Admitting: Pediatrics

## 2024-02-11 ENCOUNTER — Telehealth: Payer: Self-pay

## 2024-02-11 NOTE — Telephone Encounter (Signed)
 Records received & uploaded to media tab in epic. Report is from 04/25/21 with recall for 2028.

## 2024-02-11 NOTE — Telephone Encounter (Signed)
-----   Message from Inocente CHRISTELLA Hausen sent at 02/10/2024  8:48 PM EST ----- Regarding: Colonoscopy report at [facility] -  I saw Clarence Davis in clinic this week -he thinks he had a colonoscopy at [facility] 5 years ago for colorectal cancer screening but we do not have the report of this.  Please contact [facility] to see if they have record of a colonoscopy at their facility.  If they do not have any record of a colonoscopy at their facility we will need to get him set up for an updated colonoscopy at the [facility] for colon cancer screening.  Thanks,  Inocente

## 2024-02-11 NOTE — Telephone Encounter (Signed)
 [facility] & spoke with Edsel. Patient had colonoscopy in 2023 with Dr. Paulita Schirmer, and is due in 2033. She is going to fax over colon report & pathology for Dr. Suzann to review.

## 2024-02-22 ENCOUNTER — Inpatient Hospital Stay (HOSPITAL_BASED_OUTPATIENT_CLINIC_OR_DEPARTMENT_OTHER)
Admission: RE | Admit: 2024-02-22 | Discharge: 2024-02-22 | Disposition: A | Source: Ambulatory Visit | Attending: Pediatrics | Admitting: Pediatrics

## 2024-02-22 ENCOUNTER — Ambulatory Visit: Payer: Self-pay | Admitting: Pediatrics

## 2024-02-22 DIAGNOSIS — K589 Irritable bowel syndrome without diarrhea: Secondary | ICD-10-CM | POA: Diagnosis not present

## 2024-02-22 DIAGNOSIS — Z8619 Personal history of other infectious and parasitic diseases: Secondary | ICD-10-CM | POA: Diagnosis not present

## 2024-02-22 DIAGNOSIS — K7460 Unspecified cirrhosis of liver: Secondary | ICD-10-CM

## 2024-02-24 ENCOUNTER — Telehealth: Payer: Self-pay

## 2024-02-24 NOTE — Telephone Encounter (Signed)
 Copied from CRM #[id number]. Topic: Clinical - Lab/Test Results >> Feb 24, 2024 12:08 PM Emylou G wrote: Reason for CRM: adv patient of lab results

## 2024-03-16 ENCOUNTER — Other Ambulatory Visit: Payer: Self-pay

## 2024-04-05 ENCOUNTER — Other Ambulatory Visit: Payer: Self-pay

## 2024-04-05 DIAGNOSIS — K2100 Gastro-esophageal reflux disease with esophagitis, without bleeding: Secondary | ICD-10-CM

## 2024-04-14 ENCOUNTER — Other Ambulatory Visit: Payer: Self-pay

## 2024-04-14 MED ORDER — ATORVASTATIN CALCIUM 40 MG PO TABS: 40.0000 mg | ORAL_TABLET | Freq: Every day | ORAL | 3 refills | Status: DC

## 2024-04-14 MED ORDER — DICYCLOMINE HCL 20 MG PO TABS: 20.0000 mg | ORAL_TABLET | Freq: Three times a day (TID) | ORAL | 0 refills | Status: DC

## 2024-04-14 MED ORDER — PRAZOSIN HCL 1 MG PO CAPS: 1.0000 mg | ORAL_CAPSULE | Freq: Every day | ORAL | 1 refills | Status: DC

## 2024-04-14 NOTE — Telephone Encounter (Signed)
 Copied from CRM [id number]. Topic: Clinical - Medication Refill >> Apr 14, 2024  8:55 AM [facility] wrote: Medication: atorvastatin  (LIPITOR) 40 MG tablet, dicyclomine  (BENTYL ) 20 MG tablet, prazosin  (MINIPRESS ) 1 MG capsule  Has the patient contacted their pharmacy? Yes (Agent: If no, request that the patient contact the pharmacy for the refill. If patient does not wish to contact the pharmacy document the reason why and proceed with request.) (Agent: If yes, when and what did the pharmacy advise?)  This is the patient's preferred pharmacy:   [facility] - [place], [place] - [address] [place] [address] Phone: [phone] Fax: [phone]  Is this the correct pharmacy for this prescription? Yes If no, delete pharmacy and type the correct one.   Has the prescription been filled recently? Yes  Is the patient out of the medication? Yes  Has the patient been seen for an appointment in the last year OR does the patient have an upcoming appointment? Yes  Can we respond through MyChart? Yes  Agent: Please be advised that Rx refills may take up to 3 business days. We ask that you follow-up with your pharmacy.

## 2024-04-17 ENCOUNTER — Ambulatory Visit

## 2024-04-17 ENCOUNTER — Ambulatory Visit: Payer: Self-pay

## 2024-04-17 VITALS — BP 110/62 | HR 68 | Temp 98.3°F | Ht 69.0 in | Wt 164.8 lb

## 2024-04-17 DIAGNOSIS — F17200 Nicotine dependence, unspecified, uncomplicated: Secondary | ICD-10-CM | POA: Diagnosis not present

## 2024-04-17 DIAGNOSIS — I700 Atherosclerosis of aorta: Secondary | ICD-10-CM

## 2024-04-17 DIAGNOSIS — J4489 Other specified chronic obstructive pulmonary disease: Secondary | ICD-10-CM | POA: Diagnosis not present

## 2024-04-17 DIAGNOSIS — R0789 Other chest pain: Secondary | ICD-10-CM | POA: Diagnosis not present

## 2024-04-17 DIAGNOSIS — R7303 Prediabetes: Secondary | ICD-10-CM | POA: Diagnosis not present

## 2024-04-17 DIAGNOSIS — K2100 Gastro-esophageal reflux disease with esophagitis, without bleeding: Secondary | ICD-10-CM

## 2024-04-17 DIAGNOSIS — F17210 Nicotine dependence, cigarettes, uncomplicated: Secondary | ICD-10-CM

## 2024-04-17 MED ORDER — OMEPRAZOLE 40 MG PO CPDR: 40.0000 mg | DELAYED_RELEASE_CAPSULE | Freq: Two times a day (BID) | ORAL | 1 refills | Status: AC

## 2024-04-17 MED ORDER — PRAZOSIN HCL 1 MG PO CAPS: 1.0000 mg | ORAL_CAPSULE | Freq: Every day | ORAL | 1 refills | Status: DC

## 2024-04-17 MED ORDER — ATORVASTATIN CALCIUM 40 MG PO TABS: 40.0000 mg | ORAL_TABLET | Freq: Every day | ORAL | 1 refills | Status: AC

## 2024-04-17 MED ORDER — CITALOPRAM HYDROBROMIDE 20 MG PO TABS: 20.0000 mg | ORAL_TABLET | Freq: Every day | ORAL | 1 refills | Status: AC

## 2024-04-17 MED ORDER — TRELEGY ELLIPTA 100-62.5-25 MCG/ACT IN AEPB: 1.0000 | INHALATION_SPRAY | Freq: Every day | RESPIRATORY_TRACT | 11 refills | Status: AC

## 2024-04-17 MED ORDER — DICYCLOMINE HCL 20 MG PO TABS: 20.0000 mg | ORAL_TABLET | Freq: Three times a day (TID) | ORAL | 2 refills | Status: AC

## 2024-04-17 NOTE — Assessment & Plan Note (Signed)
"       Orders:    Ambulatory referral to Cardiology    "

## 2024-04-17 NOTE — Progress Notes (Unsigned)
 "  Acute Office Visit  Subjective:    Patient ID: Clarence Davis, male    DOB: 01/17/1961, 64 y.o.   MRN: [id number]  Chief Complaint  Patient presents with   Chest Pain    HPI: Patient is in today for   Discussed the use of AI scribe software for clinical note transcription with the patient, who gave verbal consent to proceed.      Past Medical History:  Diagnosis Date   Alcoholic (HCC)    quit drinking many yrs ago per pt   Anxiety    Aortic atherosclerosis    Arthritis    hands (04/05/2015)   Bilateral carpal tunnel syndrome    Chronic back pain    all over   Chronic diarrhea    COPD (chronic obstructive pulmonary disease) (HCC)    Depression    takes Citalopram  daily   Dry skin    Dysrhythmia    PVC's    Emphysema of lung (HCC)    GERD (gastroesophageal reflux disease)    takes Omeprazole  daily   Head injury    early 90's   Headache    weekly (04/05/2015)   Hepatitis C    treated w/RX; don't have it anymore (04/05/2015)    Past Surgical History:  Procedure Laterality Date   ANTERIOR CERVICAL DECOMP/DISCECTOMY FUSION N/A 12/15/2013   Procedure: CERVICAL FOUR TO FIVE, CERVICAL FIVE TO SIX, CERVICAL SIX TO SEVEN CERVICAL DECOMPRESSION/DISCECTOMY FUSION 3 LEVELS;  Surgeon: Catalina CHRISTELLA Stains, MD;  Location: [facility];  Service: Neurosurgery;  Laterality: N/A;  C4-5 C5-6 C6-7 Anterior cervical decompression/diskectomy/fusion   BACK SURGERY     CLOSED REDUCTION SHOULDER DISLOCATION Left 1975   patient denies   ESOPHAGOGASTRODUODENOSCOPY     HERNIA REPAIR     INGUINAL HERNIA REPAIR Left X 3      POSTERIOR CERVICAL FUSION/FORAMINOTOMY  04/05/2015   POSTERIOR CERVICAL FUSION/FORAMINOTOMY N/A 04/05/2015   Procedure: Cervical three-four, Cervical four-five, Cervical five-six, Cervical six-seven Posterior cervical fusion with lateral mass fixation;  Surgeon: Catalina Stains, MD;  Location: [facility];  Service: Neurosurgery;  Laterality: N/A;  C3-4 C4-5  C5-6 C6-7 Posterior cervical fusion with lateral mass fixation   UMBILICAL HERNIA REPAIR     WISDOM TOOTH EXTRACTION      Family History  Problem Relation Age of Onset   Throat cancer Father    Heart disease Sister    Colon cancer Neg Hx    Stomach cancer Neg Hx    Esophageal cancer Neg Hx     Social History   Socioeconomic History   Marital status: Significant Other    Spouse name: Not on file   Number of children: 2   Years of education: Not on file   Highest education level: 12th grade  Occupational History   Occupation: disabled  Tobacco Use   Smoking status: Every Day    Current packs/day: 0.50    Average packs/day: 0.2 packs/day for 37.1 years (8.8 ttl pk-yrs)    Types: Cigarettes    Start date: 02/25/1987   Smokeless tobacco: Never   Tobacco comments:    Working on smoking  Vaping Use   Vaping status: Never Used  Substance and Sexual Activity   Alcohol use: Not Currently    Comment: 04/05/2015 recovering alcoholic; last drink 01/06/2011   Drug use: Yes    Frequency: 2.0 times per week    Types: Marijuana    Comment: occ   Sexual activity: Not Currently  Other  Topics Concern   Not on file  Social History Narrative   Patient is left-handed. He lives with his long time girlfriend Adrien Dolores in a one level home home. He drinks 3 cups of coffee and 4 Coca-Colas a day. He tries to walk when able.   Social Drivers of Health   Tobacco Use: High Risk (04/17/2024)   Patient History    Smoking Tobacco Use: Every Day    Smokeless Tobacco Use: Never    Passive Exposure: Not on file  Financial Resource Strain: Low Risk (02/18/2023)   Overall Financial Resource Strain (CARDIA)    Difficulty of Paying Living Expenses: Not hard at all  Food Insecurity: No Food Insecurity (02/18/2023)   Hunger Vital Sign    Worried About Running Out of Food in the Last Year: Never true    Ran Out of Food in the Last Year: Never true  Transportation Needs: No Transportation Needs  (02/18/2023)   PRAPARE - Administrator, Civil Service (Medical): No    Lack of Transportation (Non-Medical): No  Physical Activity: Inactive (02/18/2023)   Exercise Vital Sign    Days of Exercise per Week: 0 days    Minutes of Exercise per Session: 0 min  Stress: No Stress Concern Present (02/18/2023)   [employer] of Occupational Health - Occupational Stress Questionnaire    Feeling of Stress : Not at all  Social Connections: Moderately Integrated (02/18/2023)   Social Connection and Isolation Panel    Frequency of Communication with Friends and Family: More than three times a week    Frequency of Social Gatherings with Friends and Family: Three times a week    Attends Religious Services: Never    Active Member of Clubs or Organizations: Yes    Attends Banker Meetings: More than 4 times per year    Marital Status: Living with partner  Intimate Partner Violence: Not At Risk (02/18/2023)   Humiliation, Afraid, Rape, and Kick questionnaire    Fear of Current or Ex-Partner: No    Emotionally Abused: No    Physically Abused: No    Sexually Abused: No  Depression (PHQ2-9): Low Risk (04/17/2024)   Depression (PHQ2-9)    PHQ-2 Score: 0  Alcohol Screen: Low Risk (02/18/2023)   Alcohol Screen    Last Alcohol Screening Score (AUDIT): 0  Housing: Low Risk (02/18/2023)   Housing    Last Housing Risk Score: 0  Utilities: Not At Risk (02/18/2023)   AHC Utilities    Threatened with loss of utilities: No  Health Literacy: Adequate Health Literacy (02/18/2023)   B1300 Health Literacy    Frequency of need for help with medical instructions: Never    Outpatient Medications Prior to Visit  Medication Sig Dispense Refill   atorvastatin  (LIPITOR) 40 MG tablet Take 1 tablet (40 mg total) by mouth daily. 90 tablet 3   citalopram  (CELEXA ) 20 MG tablet TAKE 1 TABLET EVERY DAY 90 tablet 0   dicyclomine  (BENTYL ) 20 MG tablet Take 1 tablet (20 mg total) by mouth 3  (three) times daily before meals. 60 tablet 0   Multiple Vitamin (MULTIVITAMIN WITH MINERALS) TABS tablet Take 1 tablet by mouth daily.     omeprazole  (PRILOSEC) 40 MG capsule TAKE 1 CAPSULE TWICE DAILY 180 capsule 0   prazosin  (MINIPRESS ) 1 MG capsule Take 1 capsule (1 mg total) by mouth at bedtime. 30 capsule 1   No facility-administered medications prior to visit.    Allergies[1]  Review of  Systems  Constitutional:  Negative for chills, fatigue and fever.  HENT:  Negative for congestion, ear pain and sinus pain.   Respiratory:  Negative for cough and shortness of breath.   Cardiovascular:  Positive for chest pain.  Gastrointestinal:  Negative for abdominal pain, constipation, diarrhea, nausea and vomiting.  Musculoskeletal:  Negative for myalgias.  Neurological:  Negative for headaches.       Objective:        04/17/2024    2:03 PM 02/08/2024    4:02 PM 01/18/2024    9:53 AM  Vitals with BMI  Height 5' 9 5' 9 5' 10  Weight 164 lbs 13 oz 161 lbs 161 lbs  BMI 24.33 23.76 23.1  Systolic 110 130 899  Diastolic 62 70 50  Pulse 68 68 75    No data found.   Physical Exam  Health Maintenance Due  Topic Date Due   Colonoscopy  11/01/2022   Medicare Annual Wellness (AWV)  02/18/2024    There are no preventive care reminders to display for this patient.   No results found for: TSH Lab Results  Component Value Date   WBC 5.3 11/23/2023   HGB 13.3 11/23/2023   HCT 40.0 11/23/2023   MCV 102 (H) 11/23/2023   PLT 154 11/23/2023   Lab Results  Component Value Date   NA 140 11/23/2023   K 4.3 11/23/2023   CO2 23 11/23/2023   GLUCOSE 65 (L) 11/23/2023   BUN 9 11/23/2023   CREATININE 1.07 11/23/2023   BILITOT 0.4 11/23/2023   ALKPHOS 77 11/23/2023   AST 28 11/23/2023   ALT 19 11/23/2023   PROT 6.7 11/23/2023   ALBUMIN 4.5 11/23/2023   CALCIUM  9.5 11/23/2023   ANIONGAP 10 03/05/2021   EGFR 78 11/23/2023   Lab Results  Component Value Date   CHOL 163  11/23/2023   Lab Results  Component Value Date   HDL 63 11/23/2023   Lab Results  Component Value Date   LDLCALC 92 11/23/2023   Lab Results  Component Value Date   TRIG 37 11/23/2023   Lab Results  Component Value Date   CHOLHDL 2.6 11/23/2023   Lab Results  Component Value Date   HGBA1C 5.5 11/23/2023        Results for orders placed or performed in visit on 12/20/23  Hepatitis C antibody   Collection Time: 12/20/23  4:30 PM  Result Value Ref Range   Hep C Virus Ab Reactive (A) Non Reactive  HCV RNA quant   Collection Time: 12/20/23  4:30 PM  Result Value Ref Range   Hepatitis C Quantitation HCV Not Detected IU/mL   Test Information Comment      Assessment & Plan:   Assessment & Plan Obstructive chronic bronchitis without exacerbation (HCC)     Aortic atherosclerosis  Orders:   Ambulatory referral to Cardiology  Mid sternal chest pain  Orders:   Ambulatory referral to Cardiology  Tobacco use disorder  Orders:   Ambulatory referral to Cardiology  Borderline diabetes  Orders:   Ambulatory referral to Cardiology     Body mass index is 24.34 kg/m.SABRA  Assessment and Plan      No orders of the defined types were placed in this encounter.   No orders of the defined types were placed in this encounter.    Follow-up: No follow-ups on file.  An After Visit Summary was printed and given to the patient.  Tanaiya Kolarik, MD [facility] [phone]  [phone]     [1]  Allergies Allergen Reactions   Tramadol Rash and Other (See Comments)    dizzy Other reaction(s): Other (See Comments) dizzy dizzy   "

## 2024-04-17 NOTE — Telephone Encounter (Signed)
" °  FYI Only or Action Required?: FYI only for provider: ED advised.  Patient was last seen in primary care on 01/18/2024 by [name], [name], MD.  Called Nurse Triage reporting Chest Pain.  Symptoms began several days ago.  Interventions attempted: Nothing.  Symptoms are: gradually worsening.  Triage Disposition: No disposition on file.  Patient/caregiver understands and will follow disposition?:   Copied from CRM [id number]. Topic: Clinical - Red Word Triage >> Apr 17, 2024 11:03 AM Antony RAMAN wrote: Red Word that prompted transfer to Nurse Triage: constant chest pains, mentioned he has clogged arteries Reason for Disposition  [1] Chest pain (or angina) comes and goes AND [2] is happening more often (increasing in frequency) or getting worse (increasing in severity)  (Exception: Chest pains that last only a few seconds.)  Answer Assessment - Initial Assessment Questions 1. LOCATION: Where does it hurt?       Mid chest 2. RADIATION: Does the pain go anywhere else? (e.g., into neck, jaw, arms, back)     Left arm pain 3. ONSET: When did the chest pain begin? (Minutes, hours or days)      Ongoing,  4. PATTERN: Does the pain come and go, or has it been constant since it started?  Does it get worse with exertion?      intermittent 5. DURATION: How long does it last (e.g., seconds, minutes, hours)     Few minutes 6. SEVERITY: How bad is the pain?  (e.g., Scale 1-10; mild, moderate, or severe)     9/10 when comes on.  7. CARDIAC RISK FACTORS: Do you have any history of heart problems or risk factors for heart disease? (e.g., angina, prior heart attack; diabetes, high blood pressure, high cholesterol, smoker, or strong family history of heart disease)     States has clogged arteries 8. PULMONARY RISK FACTORS: Do you have any history of lung disease?  (e.g., blood clots in lung, asthma, emphysema, birth control pills)      9. CAUSE: What do you think is causing the  chest pain?      10. OTHER SYMPTOMS: Do you have any other symptoms? (e.g., dizziness, nausea, vomiting, sweating, fever, difficulty breathing, cough)        11. PREGNANCY: Is there any chance you are pregnant? When was your last menstrual period?  Protocols used: Chest Pain-A-AH  "

## 2024-04-17 NOTE — Assessment & Plan Note (Signed)
 Clarence Davis

## 2024-04-18 DIAGNOSIS — F32A Depression, unspecified: Secondary | ICD-10-CM | POA: Insufficient documentation

## 2024-04-18 DIAGNOSIS — G5603 Carpal tunnel syndrome, bilateral upper limbs: Secondary | ICD-10-CM | POA: Insufficient documentation

## 2024-04-18 DIAGNOSIS — F1020 Alcohol dependence, uncomplicated: Secondary | ICD-10-CM | POA: Insufficient documentation

## 2024-04-18 DIAGNOSIS — F419 Anxiety disorder, unspecified: Secondary | ICD-10-CM | POA: Insufficient documentation

## 2024-04-18 DIAGNOSIS — I499 Cardiac arrhythmia, unspecified: Secondary | ICD-10-CM | POA: Insufficient documentation

## 2024-04-18 DIAGNOSIS — K529 Noninfective gastroenteritis and colitis, unspecified: Secondary | ICD-10-CM | POA: Insufficient documentation

## 2024-04-18 DIAGNOSIS — J449 Chronic obstructive pulmonary disease, unspecified: Secondary | ICD-10-CM | POA: Insufficient documentation

## 2024-04-18 DIAGNOSIS — S0990XA Unspecified injury of head, initial encounter: Secondary | ICD-10-CM | POA: Insufficient documentation

## 2024-04-18 DIAGNOSIS — B1920 Unspecified viral hepatitis C without hepatic coma: Secondary | ICD-10-CM | POA: Insufficient documentation

## 2024-04-18 DIAGNOSIS — R519 Headache, unspecified: Secondary | ICD-10-CM | POA: Insufficient documentation

## 2024-04-18 DIAGNOSIS — L853 Xerosis cutis: Secondary | ICD-10-CM | POA: Insufficient documentation

## 2024-04-20 ENCOUNTER — Ambulatory Visit

## 2024-04-20 VITALS — BP 110/62 | Ht 69.0 in | Wt 155.0 lb

## 2024-04-20 DIAGNOSIS — Z0000 Encounter for general adult medical examination without abnormal findings: Secondary | ICD-10-CM | POA: Diagnosis not present

## 2024-04-20 NOTE — Patient Instructions (Signed)
 Mr. Tu,  Thank you for taking the time for your Medicare Wellness Visit. I appreciate your continued commitment to your health goals. Please review the care plan we discussed, and feel free to reach out if I can assist you further.  Please note that Annual Wellness Visits do not include a physical exam. Some assessments may be limited, especially if the visit was conducted virtually. If needed, we may recommend an in-person follow-up with your provider.  Ongoing Care Seeing your primary care provider every 3 to 6 months helps us  monitor your health and provide consistent, personalized care.   Referrals If a referral was made during today's visit and you haven't received any updates within two weeks, please contact the referred provider directly to check on the status.  Recommended Screenings:  Health Maintenance  Topic Date Due   Colon Cancer Screening  11/01/2022   Medicare Annual Wellness Visit  02/18/2024   COVID-19 Vaccine (4 - 2025-26 season) 05/03/2024*   Zoster (Shingles) Vaccine (1 of 2) 07/16/2024*   Pneumococcal Vaccine for age over 61 (3 of 3 - PCV20 or PCV21) 11/22/2024*   Hepatitis B Vaccine (2 of 3 - Risk 3-dose series) 11/22/2024*   DTaP/Tdap/Td vaccine (1 - Tdap) 04/17/2025*   HIV Screening  04/17/2025*   Flu Shot  Completed   HPV Vaccine (No Doses Required) Completed   Hepatitis C Screening  Completed   Meningitis B Vaccine  Aged Out  *Topic was postponed. The date shown is not the original due date.       04/20/2024    4:10 PM  Advanced Directives  Does Patient Have a Medical Advance Directive? No  Would patient like information on creating a medical advance directive? No - Patient declined    Vision: Annual vision screenings are recommended for early detection of glaucoma, cataracts, and diabetic retinopathy. These exams can also reveal signs of chronic conditions such as diabetes and high blood pressure.  Dental: Annual dental screenings help detect  early signs of oral cancer, gum disease, and other conditions linked to overall health, including heart disease and diabetes.  Please see the attached documents for additional preventive care recommendations.

## 2024-04-20 NOTE — Progress Notes (Signed)
 " I connected with  Clarence Davis on 04/20/24 by a audio enabled telemedicine application and verified that I am speaking with the correct person using two identifiers.  Patient Location: Home  Provider Location: Home Office  Persons Participating in Visit: Patient.  I discussed the limitations of evaluation and management by telemedicine. The patient expressed understanding and agreed to proceed.  Vital Signs: Because this visit was a virtual/telehealth visit, some criteria may be missing or patient reported. Any vitals not documented were not able to be obtained and vitals that have been documented are patient reported.  Chief Complaint  Patient presents with   Medicare Wellness     Subjective:   Clarence Davis is a 64 y.o. male who presents for a Medicare Annual Wellness Visit.  Visit info / Clinical Intake: Medicare Wellness Visit Type:: Subsequent Annual Wellness Visit Persons participating in visit and providing information:: patient Medicare Wellness Visit Mode:: Telephone If telephone:: video declined Since this visit was completed virtually, some vitals may be partially provided or unavailable. Missing vitals are due to the limitations of the virtual format.: Documented vitals are patient reported If Telephone or Video please confirm:: I connected with patient using audio/video enable telemedicine. I verified patient identity with two identifiers, discussed telehealth limitations, and patient agreed to proceed. Patient Location:: home Provider Location:: office/clinic Interpreter Needed?: No Pre-visit prep was completed: yes AWV questionnaire completed by patient prior to visit?: no Living arrangements:: lives with spouse/significant other Patient's Overall Health Status Rating: very good Typical amount of pain: some Does pain affect daily life?: no Are you currently prescribed opioids?: no  Dietary Habits and Nutritional Risks How many meals a day?: 3 Eats fruit and  vegetables daily?: yes Most meals are obtained by: preparing own meals In the last 2 weeks, have you had any of the following?: none Diabetic:: no  Functional Status Activities of Daily Living (to include ambulation/medication): Independent Ambulation: Independent with device- listed below Home Assistive Devices/Equipment: Cane Medication Administration: Independent Home Management (perform basic housework or laundry): Independent Manage your own finances?: (!) no Primary transportation is: family / friends Concerns about vision?: no *vision screening is required for WTM* Concerns about hearing?: no  Fall Screening Falls in the past year?: 1 Number of falls in past year: 1 Was there an injury with Fall?: 1 Fall Risk Category Calculator: 3 Patient Fall Risk Level: High Fall Risk  Fall Risk Patient at Risk for Falls Due to: History of fall(s) Fall risk Follow up: Falls evaluation completed; Falls prevention discussed; Education provided  Home and Transportation Safety: All rugs have non-skid backing?: yes All stairs or steps have railings?: yes Grab bars in the bathtub or shower?: yes Have non-skid surface in bathtub or shower?: yes Good home lighting?: yes Regular seat belt use?: yes Hospital stays in the last year:: no  Cognitive Assessment Difficulty concentrating, remembering, or making decisions? : yes Will 6CIT or Mini Cog be Completed: yes What year is it?: 0 points What month is it?: 0 points Give patient an address phrase to remember (5 components): remember apple , table, penny About what time is it?: 0 points Count backwards from 20 to 1: 0 points Say the months of the year in reverse: 0 points Repeat the address phrase from earlier: 0 points 6 CIT Score: 0 points  Advance Directives (For Healthcare) Does Patient Have a Medical Advance Directive?: No Would patient like information on creating a medical advance directive?: No - Patient  declined  Reviewed/Updated  Reviewed/Updated:  Reviewed All (Medical, Surgical, Family, Medications, Allergies, Care Teams, Patient Goals)    Allergies (verified) Tramadol   Current Medications (verified) Outpatient Encounter Medications as of 04/20/2024  Medication Sig   atorvastatin  (LIPITOR) 40 MG tablet Take 1 tablet (40 mg total) by mouth daily.   citalopram  (CELEXA ) 20 MG tablet Take 1 tablet (20 mg total) by mouth daily.   dicyclomine  (BENTYL ) 20 MG tablet Take 1 tablet (20 mg total) by mouth 3 (three) times daily before meals.   Fluticasone-Umeclidin-Vilant (TRELEGY ELLIPTA ) 100-62.5-25 MCG/ACT AEPB Inhale 1 puff into the lungs daily.   Fluticasone-Umeclidin-Vilant (TRELEGY ELLIPTA ) 100-62.5-25 MCG/ACT AEPB Inhale 1 puff into the lungs daily.   Multiple Vitamin (MULTIVITAMIN WITH MINERALS) TABS tablet Take 1 tablet by mouth daily.   omeprazole  (PRILOSEC) 40 MG capsule Take 1 capsule (40 mg total) by mouth 2 (two) times daily.   prazosin  (MINIPRESS ) 1 MG capsule Take 1 capsule (1 mg total) by mouth at bedtime.   No facility-administered encounter medications on file as of 04/20/2024.    History: Past Medical History:  Diagnosis Date   Abdominal pain 06/09/2020   Alcoholic (HCC)    quit drinking many yrs ago per pt   Anxiety    Aortic atherosclerosis    Bilateral carpal tunnel syndrome    Borderline diabetes 12/20/2023   Cervical post-laminectomy syndrome 09/28/2019   Last Assessment & Plan:   Formatting of this note might be different from the original.  History of C4 through 7 ACDF and posterior fusion from C3 through C7.  Unfortunately he continues to struggle with substantial neck pain and radiation to left upper extremity.  Review of his plain films do not note any complication with hardware.     At this time Thoracic back pain is most limiting complaint.    Cervical pseudoarthrosis (HCC) 04/05/2015   Cervical stenosis of spinal canal 12/15/2013   Chronic abdominal pain  01/21/2023   Chronic diarrhea    Chronic GERD 01/21/2023   Chronic mid back pain 01/21/2023   Chronic pruritus 11/23/2023   Closed fracture of one rib of left side 01/18/2024   COPD (chronic obstructive pulmonary disease) (HCC)    Depression    takes Citalopram  daily   Dry skin    Dysrhythmia    PVC's    Elevated blood sugar 01/21/2023   Emphysema of lung (HCC)    Enlarged prostate with urinary obstruction 05/22/2019   Head injury    early 90's   Headache    weekly (04/05/2015)   Hepatitis C    treated w/RX; don't have it anymore (04/05/2015)   History of hepatitis C 12/20/2023   History of lumbar fusion 09/28/2019   Last Assessment & Plan:   Formatting of this note might be different from the original.  Status  post L3 through 5 lumbar fusion.  Review of most recent lumbar MRI shows mild degenerative changes at L1-2 and L2-3 without stenosis, pedicle screw interbody fusion L3-4 and L4-5 without stenosis, mild degenerative changes at L5-S1.  Fortunately, surgery improved his radicular symptoms.     At this tim   Hyperlipidemia LDL goal <130 01/21/2023   Inguinal hernia, right 05/23/2019   Left rotator cuff tear 05/09/2019   Lumbar adjacent segment disease with spondylolisthesis 02/25/2016   Mid sternal chest pain 04/17/2024   Night terrors, adult 01/18/2024   Non-seasonal allergic rhinitis 07/15/2020   Last Assessment & Plan:    Allergies.   Chronic many year history of perennial symptoms of allergic  rhinitis with sneezing runny nose, itching and nasal obstruction.  He is currently on Singulair and Zyrtec daily with breakthrough symptoms.  He wants to better understand options to control.  Denies significant asthmatic symptoms.   EXAMINATION reveals severe right nasal septal deformity with la   Obstructive chronic bronchitis without exacerbation (HCC) 05/08/2019   OSA (obstructive sleep apnea) 06/15/2019   Primary generalized (osteo)arthritis 05/22/2019   PTSD  (post-traumatic stress disorder) 06/15/2019   Right-sided chest pain 11/23/2023   Screening for prostate cancer 01/21/2023   Sensation of skin crawling 11/23/2023   Sinusitis 03/05/2020   Snoring 07/15/2020   Thoracic back pain 09/28/2019   Last Assessment & Plan:   Formatting of this note might be different from the original.  64 year old male with history of multilevel lumbar and cervical spine fusions with more recent development of thoracic back pain, review of plain films do note T8 indeterminate age compression fracture.  Due to tenderness directly over area we will move forward with MRI to see if he would possibly be amenable    Tobacco use disorder 01/21/2023   Past Surgical History:  Procedure Laterality Date   ANTERIOR CERVICAL DECOMP/DISCECTOMY FUSION N/A 12/15/2013   Procedure: CERVICAL FOUR TO FIVE, CERVICAL FIVE TO SIX, CERVICAL SIX TO SEVEN CERVICAL DECOMPRESSION/DISCECTOMY FUSION 3 LEVELS;  Surgeon: Catalina CHRISTELLA Stains, MD;  Location: [facility];  Service: Neurosurgery;  Laterality: N/A;  C4-5 C5-6 C6-7 Anterior cervical decompression/diskectomy/fusion   BACK SURGERY     CLOSED REDUCTION SHOULDER DISLOCATION Left 1975   patient denies   ESOPHAGOGASTRODUODENOSCOPY     HERNIA REPAIR     INGUINAL HERNIA REPAIR Left X 3      POSTERIOR CERVICAL FUSION/FORAMINOTOMY  04/05/2015   POSTERIOR CERVICAL FUSION/FORAMINOTOMY N/A 04/05/2015   Procedure: Cervical three-four, Cervical four-five, Cervical five-six, Cervical six-seven Posterior cervical fusion with lateral mass fixation;  Surgeon: Catalina Stains, MD;  Location: [facility];  Service: Neurosurgery;  Laterality: N/A;  C3-4 C4-5 C5-6 C6-7 Posterior cervical fusion with lateral mass fixation   UMBILICAL HERNIA REPAIR     WISDOM TOOTH EXTRACTION     Family History  Problem Relation Age of Onset   Throat cancer Father    Heart disease Sister    Colon cancer Neg Hx    Stomach cancer Neg Hx    Esophageal cancer Neg Hx     Social History   Occupational History   Occupation: disabled  Tobacco Use   Smoking status: Every Day    Current packs/day: 0.50    Average packs/day: 0.2 packs/day for 37.2 years (8.8 ttl pk-yrs)    Types: Cigarettes    Start date: 02/25/1987   Smokeless tobacco: Never   Tobacco comments:    Working on smoking  Vaping Use   Vaping status: Never Used  Substance and Sexual Activity   Alcohol use: Not Currently    Comment: 04/05/2015 recovering alcoholic; last drink 01/06/2011   Drug use: Yes    Frequency: 2.0 times per week    Types: Marijuana    Comment: occ   Sexual activity: Not Currently   Tobacco Counseling Ready to quit: Not Answered Counseling given: Not Answered Tobacco comments: Working on smoking  SDOH Screenings   Food Insecurity: No Food Insecurity (04/20/2024)  Housing: Low Risk (04/20/2024)  Transportation Needs: No Transportation Needs (04/20/2024)  Utilities: Not At Risk (04/20/2024)  Alcohol Screen: Low Risk (02/18/2023)  Depression (PHQ2-9): Low Risk (04/20/2024)  Financial Resource Strain: Low Risk (02/18/2023)  Physical Activity:  Sufficiently Active (04/20/2024)  Social Connections: Moderately Integrated (04/20/2024)  Stress: No Stress Concern Present (04/20/2024)  Tobacco Use: High Risk (04/20/2024)  Health Literacy: Adequate Health Literacy (04/20/2024)   See flowsheets for full screening details  Depression Screen PHQ 2 & 9 Depression Scale- Over the past 2 weeks, how often have you been bothered by any of the following problems? Little interest or pleasure in doing things: 0 Feeling down, depressed, or hopeless (PHQ Adolescent also includes...irritable): 0 PHQ-2 Total Score: 0 Trouble falling or staying asleep, or sleeping too much: 0 Feeling tired or having little energy: 0 Poor appetite or overeating (PHQ Adolescent also includes...weight loss): 0 Feeling bad about yourself - or that you are a failure or have let yourself or your family down:  0 Trouble concentrating on things, such as reading the newspaper or watching television (PHQ Adolescent also includes...like school work): 0 Moving or speaking so slowly that other people could have noticed. Or the opposite - being so fidgety or restless that you have been moving around a lot more than usual: 0 Thoughts that you would be better off dead, or of hurting yourself in some way: 0 PHQ-9 Total Score: 0 If you checked off any problems, how difficult have these problems made it for you to do your work, take care of things at home, or get along with other people?: Not difficult at all  Depression Treatment Depression Interventions/Treatment : [id number] Score <4 Follow-up Not Indicated     Goals Addressed             This Visit's Progress    Patient Stated   On track    Continue current lifestyle             Objective:    Today's Vitals   04/20/24 1609  BP: 110/62  Weight: 155 lb (70.3 kg)  Height: 5' 9 (1.753 m)   Body mass index is 22.89 kg/m.  Hearing/Vision screen Hearing Screening - Comments:: No difficulties  Vision Screening - Comments:: Patient wears readers  Immunizations and Health Maintenance Health Maintenance  Topic Date Due   Colonoscopy  11/01/2022   Medicare Annual Wellness (AWV)  02/18/2024   COVID-19 Vaccine (4 - 2025-26 season) 05/03/2024 (Originally 12/06/2023)   Zoster Vaccines- Shingrix (1 of 2) 07/16/2024 (Originally 09/26/2011)   Pneumococcal Vaccine: 50+ Years (3 of 3 - PCV20 or PCV21) 11/22/2024 (Originally 12/23/2022)   Hepatitis B Vaccines 19-59 Average Risk (2 of 3 - Risk 3-dose series) 11/22/2024 (Originally 11/21/2012)   DTaP/Tdap/Td (1 - Tdap) 04/17/2025 (Originally 09/25/1980)   HIV Screening  04/17/2025 (Originally 09/25/1976)   Influenza Vaccine  Completed   HPV VACCINES (No Doses Required) Completed   Hepatitis C Screening  Completed   Meningococcal B Vaccine  Aged Out        Assessment/Plan:  This is a routine wellness  examination for [facility].  Patient Care Team: [name], [name], MD as PCP - General (Family Medicine) Skeet Juliene SAUNDERS, DO as Consulting Physician (Neurology)  I have personally reviewed and noted the following in the patients chart:   Medical and social history Use of alcohol, tobacco or illicit drugs  Current medications and supplements including opioid prescriptions. Functional ability and status Nutritional status Physical activity Advanced directives List of other physicians Hospitalizations, surgeries, and ER visits in previous 12 months Vitals Screenings to include cognitive, depression, and falls Referrals and appointments  No orders of the defined types were placed in this encounter.  In addition, I have reviewed  and discussed with patient certain preventive protocols, quality metrics, and best practice recommendations. A written personalized care plan for preventive services as well as general preventive health recommendations were provided to patient.   Lyle MARLA Right, [place]   04/20/2024   No follow-ups on file.  After Visit Summary: (MyChart) Due to this being a telephonic visit, the after visit summary with patients personalized plan was offered to patient via MyChart   Nurse Notes: No voiced concerns. Patient declined colonoscopy at this time  "

## 2024-04-21 ENCOUNTER — Ambulatory Visit

## 2024-04-21 VITALS — BP 106/68 | HR 67 | Ht 69.0 in | Wt 160.4 lb

## 2024-04-21 DIAGNOSIS — R0789 Other chest pain: Secondary | ICD-10-CM | POA: Diagnosis not present

## 2024-04-21 NOTE — Assessment & Plan Note (Signed)
 Symptoms of chronic intermittent chest pain ongoing for several months.  Associated with shortness of breath.  Shortness of breath likely related to his COPD and deconditioning.  Has significant cardiovascular risk factors in the form of age, smoking and lifestyle.  Further evaluate with treadmill EKG stress test with nuclear imaging and echocardiogram.  Continue aspirin  81 mg once daily and atorvastatin  40 mg once daily.

## 2024-04-21 NOTE — Progress Notes (Signed)
 "  Cardiology Consultation:    Date:  04/21/2024   ID:  Clarence Davis, DOB 1960-11-04, MRN [id number]  PCP:  [name], [name], MD  Cardiologist:  Alean JONELLE Kobus, MD   Referring MD: [name], [name], MD   No chief complaint on file.    ASSESSMENT AND PLAN:   [name] is a 64 year old male with history of Borderline diabetes, COPD, aortic atherosclerosis, tobacco use [heavy smoker since age 87, up to a pack a day currently], GERD, chronic back pain. History of alcohol abuse [quit 15 years ago].  Smokes marijuana frequently Denies any prior history of CAD, CHF, MI, CVA. Reports remote history of echocardiogram and stress test over 15 years ago.  Here for the visit for evaluation of shortness of breath and chest pain Problem List Items Addressed This Visit       Other   Mid sternal chest pain - Primary   Symptoms of chronic intermittent chest pain ongoing for several months.  Associated with shortness of breath.  Shortness of breath likely related to his COPD and deconditioning.  Has significant cardiovascular risk factors in the form of age, smoking and lifestyle.  Further evaluate with treadmill EKG stress test with nuclear imaging and echocardiogram.  Continue aspirin  81 mg once daily and atorvastatin  40 mg once daily.      Relevant Orders   EKG 12-Lead (Completed)   ECHOCARDIOGRAM COMPLETE   MYOCARDIAL PERFUSION IMAGING   Return to clinic based on test results.   History of Present Illness:    Clarence Davis is a 64 y.o. male who is being seen today for the evaluation of chest pain and shortness of breath at the request of [name], [name], MD.   Pleasant man here for the visit today accompanied by his wife.  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  Borderline diabetes, COPD, aortic atherosclerosis, tobacco use [heavy smoker since age 13, up to a pack a day currently], GERD, chronic back pain. History of alcohol abuse  [quit 15 years ago].  Smokes marijuana frequently Denies any prior history of CAD, CHF, MI, CVA. Reports remote history of echocardiogram and stress test over 15 years ago.  Mentions overall has been dealing with symptoms of sharp stabbing chest pain going on for several months. Occurs randomly. Significant shortness of breath limiting his day-to-day functional status. However keeps going and keeps himself busy at home.  Denies any orthopnea, paroxysmal nocturnal dyspnea. No pedal edema.  No blood in urine or stools.  Continues to smoke up to a pack a day of cigarettes. Smokes marijuana occasionally.  EKG in the clinic today shows sinus rhythm heart rate 67/min, PR interval 172 ms.  QRS duration 76 ms, QTc 407 ms, no ischemic changes.  Past Medical History:  Diagnosis Date   Abdominal pain 06/09/2020   Alcoholic (HCC)    quit drinking many yrs ago per pt   Anxiety    Aortic atherosclerosis    Bilateral carpal tunnel syndrome    Borderline diabetes 12/20/2023   Cervical post-laminectomy syndrome 09/28/2019   Last Assessment & Plan:   Formatting of this note might be different from the original.  History of C4 through 7 ACDF and posterior fusion from C3 through C7.  Unfortunately he continues to struggle with substantial neck pain and radiation to left upper extremity.  Review of his plain films do not note any complication with hardware.     At this time Thoracic back pain is most limiting complaint.  Cervical pseudoarthrosis (HCC) 04/05/2015   Cervical stenosis of spinal canal 12/15/2013   Chronic abdominal pain 01/21/2023   Chronic diarrhea    Chronic GERD 01/21/2023   Chronic mid back pain 01/21/2023   Chronic pruritus 11/23/2023   Closed fracture of one rib of left side 01/18/2024   COPD (chronic obstructive pulmonary disease) (HCC)    Depression    takes Citalopram  daily   Dry skin    Dysrhythmia    PVC's    Elevated blood sugar 01/21/2023   Emphysema of lung (HCC)     Enlarged prostate with urinary obstruction 05/22/2019   Head injury    early 90's   Headache    weekly (04/05/2015)   Hepatitis C    treated w/RX; don't have it anymore (04/05/2015)   History of hepatitis C 12/20/2023   History of lumbar fusion 09/28/2019   Last Assessment & Plan:   Formatting of this note might be different from the original.  Status  post L3 through 5 lumbar fusion.  Review of most recent lumbar MRI shows mild degenerative changes at L1-2 and L2-3 without stenosis, pedicle screw interbody fusion L3-4 and L4-5 without stenosis, mild degenerative changes at L5-S1.  Fortunately, surgery improved his radicular symptoms.     At this tim   Hyperlipidemia LDL goal <130 01/21/2023   Inguinal hernia, right 05/23/2019   Left rotator cuff tear 05/09/2019   Lumbar adjacent segment disease with spondylolisthesis 02/25/2016   Mid sternal chest pain 04/17/2024   Night terrors, adult 01/18/2024   Non-seasonal allergic rhinitis 07/15/2020   Last Assessment & Plan:    Allergies.   Chronic many year history of perennial symptoms of allergic rhinitis with sneezing runny nose, itching and nasal obstruction.  He is currently on Singulair and Zyrtec daily with breakthrough symptoms.  He wants to better understand options to control.  Denies significant asthmatic symptoms.   EXAMINATION reveals severe right nasal septal deformity with la   Obstructive chronic bronchitis without exacerbation (HCC) 05/08/2019   OSA (obstructive sleep apnea) 06/15/2019   Primary generalized (osteo)arthritis 05/22/2019   PTSD (post-traumatic stress disorder) 06/15/2019   Right-sided chest pain 11/23/2023   Screening for prostate cancer 01/21/2023   Sensation of skin crawling 11/23/2023   Sinusitis 03/05/2020   Snoring 07/15/2020   Thoracic back pain 09/28/2019   Last Assessment & Plan:   Formatting of this note might be different from the original.  64 year old male with history of multilevel lumbar and  cervical spine fusions with more recent development of thoracic back pain, review of plain films do note T8 indeterminate age compression fracture.  Due to tenderness directly over area we will move forward with MRI to see if he would possibly be amenable    Tobacco use disorder 01/21/2023    Past Surgical History:  Procedure Laterality Date   ANTERIOR CERVICAL DECOMP/DISCECTOMY FUSION N/A 12/15/2013   Procedure: CERVICAL FOUR TO FIVE, CERVICAL FIVE TO SIX, CERVICAL SIX TO SEVEN CERVICAL DECOMPRESSION/DISCECTOMY FUSION 3 LEVELS;  Surgeon: Catalina CHRISTELLA Stains, MD;  Location: [facility];  Service: Neurosurgery;  Laterality: N/A;  C4-5 C5-6 C6-7 Anterior cervical decompression/diskectomy/fusion   BACK SURGERY     CLOSED REDUCTION SHOULDER DISLOCATION Left 1975   patient denies   ESOPHAGOGASTRODUODENOSCOPY     HERNIA REPAIR     INGUINAL HERNIA REPAIR Left X 3      POSTERIOR CERVICAL FUSION/FORAMINOTOMY  04/05/2015   POSTERIOR CERVICAL FUSION/FORAMINOTOMY N/A 04/05/2015   Procedure: Cervical three-four, Cervical four-five,  Cervical five-six, Cervical six-seven Posterior cervical fusion with lateral mass fixation;  Surgeon: Catalina Stains, MD;  Location: [facility];  Service: Neurosurgery;  Laterality: N/A;  C3-4 C4-5 C5-6 C6-7 Posterior cervical fusion with lateral mass fixation   UMBILICAL HERNIA REPAIR     WISDOM TOOTH EXTRACTION      Current Medications: Active Medications[1]   Allergies:   Tramadol   Social History   Socioeconomic History   Marital status: Significant Other    Spouse name: Not on file   Number of children: 2   Years of education: Not on file   Highest education level: 12th grade  Occupational History   Occupation: disabled  Tobacco Use   Smoking status: Every Day    Current packs/day: 0.50    Average packs/day: 0.2 packs/day for 37.2 years (8.8 ttl pk-yrs)    Types: Cigarettes    Start date: 02/25/1987   Smokeless tobacco: Never   Tobacco comments:     Working on smoking  Vaping Use   Vaping status: Never Used  Substance and Sexual Activity   Alcohol use: Not Currently    Comment: 04/05/2015 recovering alcoholic; last drink 01/06/2011   Drug use: Yes    Frequency: 2.0 times per week    Types: Marijuana    Comment: occ   Sexual activity: Not Currently  Other Topics Concern   Not on file  Social History Narrative   Patient is left-handed. He lives with his long time girlfriend Adrien Dolores in a one level home home. He drinks 3 cups of coffee and 4 Coca-Colas a day. He tries to walk when able.   Social Drivers of Health   Tobacco Use: High Risk (04/21/2024)   Patient History    Smoking Tobacco Use: Every Day    Smokeless Tobacco Use: Never    Passive Exposure: Not on file  Financial Resource Strain: Low Risk (02/18/2023)   Overall Financial Resource Strain (CARDIA)    Difficulty of Paying Living Expenses: Not hard at all  Food Insecurity: No Food Insecurity (04/20/2024)   Epic    Worried About Programme Researcher, Broadcasting/film/video in the Last Year: Never true    Ran Out of Food in the Last Year: Never true  Transportation Needs: No Transportation Needs (04/20/2024)   Epic    Lack of Transportation (Medical): No    Lack of Transportation (Non-Medical): No  Physical Activity: Sufficiently Active (04/20/2024)   Exercise Vital Sign    Days of Exercise per Week: 7 days    Minutes of Exercise per Session: 30 min  Stress: No Stress Concern Present (04/20/2024)   [employer] of Occupational Health - Occupational Stress Questionnaire    Feeling of Stress: Not at all  Social Connections: Moderately Integrated (04/20/2024)   Social Connection and Isolation Panel    Frequency of Communication with Friends and Family: More than three times a week    Frequency of Social Gatherings with Friends and Family: Three times a week    Attends Religious Services: Never    Active Member of Clubs or Organizations: Yes    Attends Banker Meetings:  More than 4 times per year    Marital Status: Living with partner  Depression (PHQ2-9): Low Risk (04/20/2024)   Depression (PHQ2-9)    PHQ-2 Score: 0  Alcohol Screen: Low Risk (02/18/2023)   Alcohol Screen    Last Alcohol Screening Score (AUDIT): 0  Housing: Low Risk (04/20/2024)   Epic    Unable to  Pay for Housing in the Last Year: No    Number of Times Moved in the Last Year: 0    Homeless in the Last Year: No  Utilities: Not At Risk (04/20/2024)   Epic    Threatened with loss of utilities: No  Health Literacy: Adequate Health Literacy (04/20/2024)   B1300 Health Literacy    Frequency of need for help with medical instructions: Never     Family History: The patient's family history includes Heart disease in his sister; Throat cancer in his father. There is no history of Colon cancer, Stomach cancer, or Esophageal cancer. ROS:   Please see the history of present illness.    All 14 point review of systems negative except as described per history of present illness.  EKGs/Labs/Other Studies Reviewed:    The following studies were reviewed today:   EKG:  EKG Interpretation Date/Time:  Friday April 21 2024 09:57:16 EST Ventricular Rate:  67 PR Interval:  172 QRS Duration:  76 QT Interval:  386 QTC Calculation: 407 R Axis:   78  Text Interpretation: Normal sinus rhythm Possible Left atrial enlargement Septal infarct , age undetermined When compared with ECG of 19-Feb-2016 13:22, Septal infarct is now Present Inverted T waves have replaced nonspecific T wave abnormality in Inferior leads Confirmed by Liborio Hai reddy [phone]) on 04/21/2024 10:12:09 AM    Recent Labs: 11/23/2023: ALT 19; BUN 9; Creatinine, Ser 1.07; Hemoglobin 13.3; Platelets 154; Potassium 4.3; Sodium 140  Recent Lipid Panel    Component Value Date/Time   CHOL 163 11/23/2023 1019   TRIG 37 11/23/2023 1019   HDL 63 11/23/2023 1019   CHOLHDL 2.6 11/23/2023 1019   LDLCALC 92 11/23/2023 1019     Physical Exam:    VS:  BP 106/68   Pulse 67   Ht 5' 9 (1.753 m)   Wt 160 lb 6 oz (72.7 kg)   SpO2 100%   BMI 23.68 kg/m     Wt Readings from Last 3 Encounters:  04/21/24 160 lb 6 oz (72.7 kg)  04/20/24 155 lb (70.3 kg)  04/17/24 164 lb 12.8 oz (74.8 kg)     GENERAL:  Well nourished, well developed in no acute distress NECK: No JVD; No carotid bruits CARDIAC: RRR, S1 and S2 present, no murmurs, no rubs, no gallops CHEST:  Clear to auscultation without rales, wheezing or rhonchi  Extremities: No pitting pedal edema. Pulses bilaterally symmetric with radial 2+ and dorsalis pedis 2+ NEUROLOGIC:  Alert and oriented x 3  Medication Adjustments/Labs and Tests Ordered: Current medicines are reviewed at length with the patient today.  Concerns regarding medicines are outlined above.  Orders Placed This Encounter  Procedures   MYOCARDIAL PERFUSION IMAGING   EKG 12-Lead   ECHOCARDIOGRAM COMPLETE   No orders of the defined types were placed in this encounter.   Signed, Hai reddy Biannca Scantlin, MD, MPH, [facility] 04/21/2024 10:44 AM    [facility]    [1]  Current Meds  Medication Sig   aspirin  EC 81 MG tablet Take 81 mg by mouth daily. Swallow whole.   atorvastatin  (LIPITOR) 40 MG tablet Take 1 tablet (40 mg total) by mouth daily.   Calcium  Polycarbophil (FIBER-CAPS PO) Take 2 tablets by mouth daily at 6 (six) AM.   citalopram  (CELEXA ) 20 MG tablet Take 1 tablet (20 mg total) by mouth daily.   dicyclomine  (BENTYL ) 20 MG tablet Take 1 tablet (20 mg total) by mouth 3 (three) times daily  before meals.   Fluticasone-Umeclidin-Vilant (TRELEGY ELLIPTA ) 100-62.5-25 MCG/ACT AEPB Inhale 1 puff into the lungs daily.   Fluticasone-Umeclidin-Vilant (TRELEGY ELLIPTA ) 100-62.5-25 MCG/ACT AEPB Inhale 1 puff into the lungs daily.   Multiple Vitamin (MULTIVITAMIN WITH MINERALS) TABS tablet Take 1 tablet by mouth daily.   omeprazole  (PRILOSEC) 40 MG capsule Take 1  capsule (40 mg total) by mouth 2 (two) times daily.   tamsulosin  (FLOMAX ) 0.4 MG CAPS capsule Take 0.4 mg by mouth daily.   "

## 2024-04-21 NOTE — Patient Instructions (Signed)
 Medication Instructions:  Your physician recommends that you continue on your current medications as directed. Please refer to the Current Medication list given to you today.  *If you need a refill on your cardiac medications before your next appointment, please call your pharmacy*  Lab Work: None If you have labs (blood work) drawn today and your tests are completely normal, you will receive your results only by: MyChart Message (if you have MyChart) OR A paper copy in the mail If you have any lab test that is abnormal or we need to change your treatment, we will call you to review the results.  Testing/Procedures: Your physician has requested that you have an echocardiogram. Echocardiography is a painless test that uses sound waves to create images of your heart. It provides your doctor with information about the size and shape of your heart and how well your hearts chambers and valves are working. This procedure takes approximately one hour. There are no restrictions for this procedure. Please do NOT wear cologne, perfume, aftershave, or lotions (deodorant is allowed). Please arrive 15 minutes prior to your appointment time.  Please note: We ask at that you not bring children with you during ultrasound (echo/ vascular) testing. Due to room size and safety concerns, children are not allowed in the ultrasound rooms during exams. Our front office staff cannot provide observation of children in our lobby area while testing is being conducted. An adult accompanying a patient to their appointment will only be allowed in the ultrasound room at the discretion of the ultrasound technician under special circumstances. We apologize for any inconvenience.    [facility] [address] [place], [place] [address] Phone:  [phone]    Please arrive 15 minutes prior to your appointment time for registration and insurance purposes.  The test will take approximately 3 to  4 hours to complete; you may bring reading material.  If someone comes with you to your appointment, they will need to remain in the main lobby due to limited space in the testing area. **If you are pregnant or breastfeeding, please notify the nuclear lab prior to your appointment**  How to prepare for your Myocardial Perfusion Test: Do not eat or drink 3 hours prior to your test, except you may have water . Do not consume products containing caffeine (regular or decaffeinated) 12 hours prior to your test. (ex: coffee, chocolate, sodas, tea). Do bring a list of your current medications with you.  If not listed below, you may take your medications as normal. Do wear comfortable clothes (no dresses or overalls) and walking shoes, tennis shoes preferred (No heels or open toe shoes are allowed). Do NOT wear cologne, perfume, aftershave, or lotions (deodorant is allowed). If these instructions are not followed, your test will have to be rescheduled.  Please report to [address] for your test.  If you have questions or concerns about your appointment, you can call the [facility] at [phone].  If you cannot keep your appointment, please provide 24 hours notification to the Nuclear Lab, to avoid a possible $50 charge to your account.   Follow-Up: At [facility], you and your health needs are our priority.  As part of our continuing mission to provide you with exceptional heart care, our providers are all part of one team.  This team includes your primary Cardiologist (physician) and Advanced Practice Providers or APPs (Physician Assistants and Nurse Practitioners) who all work together to  provide you with the care you need, when you need it.  Your next appointment:   Follow up based on test results.  Provider:   Alean Kobus, MD    We recommend signing up for the patient portal called MyChart.  Sign up information is provided on this  After Visit Summary.  MyChart is used to connect with patients for Virtual Visits (Telemedicine).  Patients are able to view lab/test results, encounter notes, upcoming appointments, etc.  Non-urgent messages can be sent to your provider as well.   To learn more about what you can do with MyChart, go to [email or web address].   Other Instructions None

## 2024-04-26 ENCOUNTER — Ambulatory Visit

## 2024-04-26 DIAGNOSIS — R0789 Other chest pain: Secondary | ICD-10-CM | POA: Diagnosis not present

## 2024-04-26 MED ORDER — REGADENOSON 0.4 MG/5ML IV SOLN
0.4000 mg | Freq: Once | INTRAVENOUS | Status: AC
  Administered 2024-04-26: 0.4 mg via INTRAVENOUS

## 2024-04-26 MED ORDER — TECHNETIUM TC 99M TETROFOSMIN IV KIT
10.9000 | PACK | Freq: Once | INTRAVENOUS | Status: AC | PRN
  Administered 2024-04-26: 10.9 via INTRAVENOUS

## 2024-04-26 MED ORDER — TECHNETIUM TC 99M TETROFOSMIN IV KIT
29.6000 | PACK | Freq: Once | INTRAVENOUS | Status: AC | PRN
  Administered 2024-04-26: 29.6 via INTRAVENOUS

## 2024-04-28 LAB — MYOCARDIAL PERFUSION IMAGING
LV dias vol: 102 mL (ref 62–150)
LV sys vol: 35 mL
Nuc Stress EF: 66 %
Peak HR: 108 {beats}/min
Rest HR: 68 {beats}/min
Rest Nuclear Isotope Dose: 10.9 mCi
SDS: 2
SRS: 2
SSS: 4
ST Depression (mm): 0 mm
Stress Nuclear Isotope Dose: 29.6 mCi
TID: 0.97

## 2024-05-11 ENCOUNTER — Ambulatory Visit: Payer: Self-pay

## 2024-05-11 NOTE — Telephone Encounter (Signed)
Pt returning call to nurse for results

## 2024-05-17 ENCOUNTER — Ambulatory Visit
